# Patient Record
Sex: Female | Born: 1985 | Race: Black or African American | Hispanic: No | Marital: Single | Smoking: Current every day smoker
Health system: Southern US, Community
[De-identification: ages and names within clinical notes are randomized; demographics above are authoritative.]

## PROBLEM LIST (undated history)

## (undated) DIAGNOSIS — N83209 Unspecified ovarian cyst, unspecified side: Secondary | ICD-10-CM

## (undated) DIAGNOSIS — I10 Essential (primary) hypertension: Secondary | ICD-10-CM

## (undated) DIAGNOSIS — N390 Urinary tract infection, site not specified: Secondary | ICD-10-CM

## (undated) DIAGNOSIS — R0600 Dyspnea, unspecified: Secondary | ICD-10-CM

## (undated) HISTORY — PX: APPENDECTOMY: SHX54

---
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
## 2005-05-08 ENCOUNTER — Inpatient Hospital Stay (HOSPITAL_COMMUNITY): Admission: AD | Admit: 2005-05-08 | Discharge: 2005-05-08 | Payer: Self-pay | Admitting: Obstetrics and Gynecology

## 2005-07-22 ENCOUNTER — Ambulatory Visit (HOSPITAL_COMMUNITY): Admission: RE | Admit: 2005-07-22 | Discharge: 2005-07-22 | Payer: Self-pay | Admitting: Obstetrics

## 2005-10-13 ENCOUNTER — Inpatient Hospital Stay (HOSPITAL_COMMUNITY): Admission: AD | Admit: 2005-10-13 | Discharge: 2005-10-14 | Payer: Self-pay | Admitting: Obstetrics

## 2005-11-17 ENCOUNTER — Inpatient Hospital Stay (HOSPITAL_COMMUNITY): Admission: AD | Admit: 2005-11-17 | Discharge: 2005-11-20 | Payer: Self-pay | Admitting: Obstetrics

## 2006-01-07 ENCOUNTER — Emergency Department (HOSPITAL_COMMUNITY): Admission: EM | Admit: 2006-01-07 | Discharge: 2006-01-08 | Payer: Self-pay | Admitting: Emergency Medicine

## 2006-05-25 ENCOUNTER — Inpatient Hospital Stay (HOSPITAL_COMMUNITY): Admission: AD | Admit: 2006-05-25 | Discharge: 2006-05-25 | Payer: Self-pay | Admitting: Obstetrics

## 2006-06-11 ENCOUNTER — Emergency Department (HOSPITAL_COMMUNITY): Admission: EM | Admit: 2006-06-11 | Discharge: 2006-06-11 | Payer: Self-pay | Admitting: Emergency Medicine

## 2006-10-06 ENCOUNTER — Emergency Department (HOSPITAL_COMMUNITY): Admission: EM | Admit: 2006-10-06 | Discharge: 2006-10-07 | Payer: Self-pay | Admitting: Emergency Medicine

## 2007-01-05 ENCOUNTER — Emergency Department (HOSPITAL_COMMUNITY): Admission: EM | Admit: 2007-01-05 | Discharge: 2007-01-05 | Payer: Self-pay | Admitting: Emergency Medicine

## 2007-04-10 ENCOUNTER — Emergency Department (HOSPITAL_COMMUNITY): Admission: EM | Admit: 2007-04-10 | Discharge: 2007-04-10 | Payer: Self-pay | Admitting: Emergency Medicine

## 2007-05-28 HISTORY — PX: OVARIAN CYST SURGERY: SHX726

## 2007-09-29 ENCOUNTER — Inpatient Hospital Stay (HOSPITAL_COMMUNITY): Admission: AD | Admit: 2007-09-29 | Discharge: 2007-09-29 | Payer: Self-pay | Admitting: Family Medicine

## 2007-10-01 ENCOUNTER — Inpatient Hospital Stay (HOSPITAL_COMMUNITY): Admission: AD | Admit: 2007-10-01 | Discharge: 2007-10-01 | Payer: Self-pay | Admitting: Family Medicine

## 2007-10-12 ENCOUNTER — Inpatient Hospital Stay (HOSPITAL_COMMUNITY): Admission: RE | Admit: 2007-10-12 | Discharge: 2007-10-12 | Payer: Self-pay | Admitting: Obstetrics & Gynecology

## 2007-12-20 ENCOUNTER — Inpatient Hospital Stay (HOSPITAL_COMMUNITY): Admission: AD | Admit: 2007-12-20 | Discharge: 2007-12-21 | Payer: Self-pay | Admitting: Obstetrics

## 2007-12-25 ENCOUNTER — Encounter (INDEPENDENT_AMBULATORY_CARE_PROVIDER_SITE_OTHER): Payer: Self-pay | Admitting: Obstetrics

## 2007-12-25 ENCOUNTER — Inpatient Hospital Stay (HOSPITAL_COMMUNITY): Admission: RE | Admit: 2007-12-25 | Discharge: 2007-12-28 | Payer: Self-pay | Admitting: Obstetrics

## 2008-01-22 ENCOUNTER — Ambulatory Visit (HOSPITAL_COMMUNITY): Admission: RE | Admit: 2008-01-22 | Discharge: 2008-01-22 | Payer: Self-pay | Admitting: Obstetrics

## 2008-05-20 ENCOUNTER — Inpatient Hospital Stay (HOSPITAL_COMMUNITY): Admission: AD | Admit: 2008-05-20 | Discharge: 2008-05-20 | Payer: Self-pay | Admitting: Obstetrics

## 2008-05-21 ENCOUNTER — Inpatient Hospital Stay (HOSPITAL_COMMUNITY): Admission: AD | Admit: 2008-05-21 | Discharge: 2008-05-24 | Payer: Self-pay | Admitting: Obstetrics

## 2008-10-28 ENCOUNTER — Emergency Department (HOSPITAL_COMMUNITY): Admission: EM | Admit: 2008-10-28 | Discharge: 2008-10-29 | Payer: Self-pay | Admitting: Emergency Medicine

## 2009-07-15 ENCOUNTER — Emergency Department (HOSPITAL_COMMUNITY): Admission: EM | Admit: 2009-07-15 | Discharge: 2009-07-15 | Payer: Self-pay | Admitting: Emergency Medicine

## 2010-08-15 LAB — URINE CULTURE: Colony Count: >=100000

## 2010-08-15 LAB — URINALYSIS, ROUTINE W REFLEX MICROSCOPIC
Ketones, ur: NEGATIVE mg/dL
Nitrite: POSITIVE — AB
Protein, ur: 100 mg/dL — AB
Urobilinogen, UA: 1 mg/dL (ref 0.0–1.0)
pH: 6.5 (ref 5.0–8.0)

## 2010-08-15 LAB — POCT PREGNANCY, URINE: Preg Test, Ur: NEGATIVE

## 2010-08-15 LAB — URINE MICROSCOPIC-ADD ON

## 2010-09-03 LAB — DIFFERENTIAL
Basophils Absolute: 0 10*3/uL (ref 0.0–0.1)
Basophils Relative: 1 % (ref 0–1)
Eosinophils Absolute: 0.1 10*3/uL (ref 0.0–0.7)
Monocytes Absolute: 0.4 10*3/uL (ref 0.1–1.0)
Neutro Abs: 1.9 10*3/uL (ref 1.7–7.7)
Neutrophils Relative %: 43 % (ref 43–77)

## 2010-09-03 LAB — COMPREHENSIVE METABOLIC PANEL
ALT: 12 U/L (ref 0–35)
Albumin: 4 g/dL (ref 3.5–5.2)
Alkaline Phosphatase: 56 U/L (ref 39–117)
BUN: 12 mg/dL (ref 6–23)
Chloride: 108 mEq/L (ref 96–112)
Glucose, Bld: 88 mg/dL (ref 70–99)
Potassium: 3.5 mEq/L (ref 3.5–5.1)
Sodium: 139 mEq/L (ref 135–145)
Total Bilirubin: 0.4 mg/dL (ref 0.3–1.2)

## 2010-09-03 LAB — CBC
HCT: 39.5 % (ref 36.0–46.0)
Hemoglobin: 13.5 g/dL (ref 12.0–15.0)
RBC: 4.39 MIL/uL (ref 3.87–5.11)
WBC: 4.5 10*3/uL (ref 4.0–10.5)

## 2010-09-03 LAB — URINALYSIS, ROUTINE W REFLEX MICROSCOPIC
Bilirubin Urine: NEGATIVE
Ketones, ur: NEGATIVE mg/dL
Nitrite: NEGATIVE
Specific Gravity, Urine: 1.033 — ABNORMAL HIGH (ref 1.005–1.030)
pH: 6.5 (ref 5.0–8.0)

## 2010-09-03 LAB — GC/CHLAMYDIA PROBE AMP, GENITAL
Chlamydia, DNA Probe: NEGATIVE
GC Probe Amp, Genital: NEGATIVE

## 2010-09-03 LAB — WET PREP, GENITAL

## 2010-09-03 LAB — URINE MICROSCOPIC-ADD ON

## 2010-09-03 LAB — POCT PREGNANCY, URINE: Preg Test, Ur: NEGATIVE

## 2010-09-03 LAB — URINE CULTURE

## 2010-09-03 LAB — PROTIME-INR: INR: 1 (ref 0.00–1.49)

## 2010-10-09 NOTE — Discharge Summary (Signed)
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   The patient was a 25 year old primigravida with bilateral dermoid cyst  and she was brought in and bilateral ovarian cystectomy was performed  with no problems.  She did well and was discharged home on the third  postoperative day, ambulatory, to see me in 2 weeks.   DISCHARGE DIAGNOSES:  Status post a intrauterine pregnancy and bilateral  ovarian cysts.           ______________________________  Kathreen Cosier, M.D.     BAM/MEDQ  D:  01/13/2008  T:  01/13/2008  Job:  [id number]

## 2010-10-09 NOTE — Op Note (Signed)
NAMECOLLIER, MONICA                ACCOUNT NO.:  [id number]   MEDICAL RECORD NO.:  [id number]          PATIENT TYPE:  INP   LOCATION:  9317                          FACILITY:  [facility]   PHYSICIAN:  Kathreen Cosier, M.D.DATE OF BIRTH:  Apr 26, 1986   DATE OF PROCEDURE:  DATE OF DISCHARGE:  12/28/2007                               OPERATIVE REPORT   PREOP DIAGNOSIS:  [redacted] weeks pregnant with bilateral dermoids 5.4 x 3.7 x  4.1 on the left, 9 x 2 x 9.2 on the right.   SURGEON:  Kathreen Cosier, MD   FIRST ASSISTANT:  Roseanna Rainbow, M.D.   ANESTHESIA:  Spinal.   PROCEDURE:  The patient was placed on the operating table in the supine  position after the spinal was administered.  Abdomen prepped and draped,  bladder emptied with Foley catheter.  Paraumbilical midline incision was  made and carried down to fascia.  Fascia cleaned and incised to length  of the incision.  Recti muscles retracted laterally.  Peritoneum incised  longitudinally.  The right ovary was noted to be dermoid 9.2 x 9.2 and  using sharp dissection, the dermoid was dissected free without rupture  and then hemostasis achieved with 2-0 Vicryl sutures interrupted.  On  the left, the procedure done on the similar fashion and hemostasis  achieved with 2-0 Vicryl.  Lap and sponge counts correct.  Abdomen  closed in layers.  Peritoneum continuous suture of 0-chromic fascia,  continuous suture of 0-Dexon.  Skin closed with staples.           ______________________________  Kathreen Cosier, M.D.     BAM/MEDQ  D:  01/13/2008  T:  01/13/2008  Job:  [id number]

## 2010-10-09 NOTE — H&P (Signed)
NAME:  Ann Bruce, Ann Bruce                ACCOUNT NO.:  [id number]   MEDICAL RECORD NO.:  [id number]          PATIENT TYPE:  INP   LOCATION:  9170                          FACILITY:  [facility]   PHYSICIAN:  Roseanna Rainbow, M.D.DATE OF BIRTH:  [birth year]   DATE OF ADMISSION:  05/21/2008  DATE OF DISCHARGE:                              HISTORY & PHYSICAL   CHIEF COMPLAINT:  The patient is a 25 year old with an intrauterine  pregnancy at 37.5 weeks complaining of contractions.   HISTORY OF PRESENT ILLNESS:  Please see the above.   SOCIAL HISTORY:  She is single.  She denies any tobacco, ethanol, or  drug use.   ALLERGIES:  No known drug allergies.   PAST GYNECOLOGIC HISTORY:  Normal triad.  There is a history of  gonorrhea.   PAST OBSTETRICAL HISTORY:  In 2007, she was delivered of a live born  female, 5 pounds 7 ounces at 37 weeks, vaginal delivery, no complications.   PAST SURGICAL HISTORY:  Appendectomy, bilateral ovarian cystectomy,  exploratory laparotomy, missed pregnancy.   FAMILY HISTORY:  Noncontributory.   OBSTETRIC RISK FACTORS:  Please see the above.   PRENATAL LABORATORY DATA:  Hemoglobin 12.2, hematocrit 36.5, platelets  229,000.  Blood type is O positive.  Antibody screen negative.  Sickle  cell trait negative.  RPR nonreactive.  Rubella immune.  Hepatitis B  surface antigen negative.  HIV nonreactive.  Abscess negative.  Gonorrhea and chlamydia probes negative.  Quad screen negative.  Three-  hour GTT normal.  GBS negative on December 8.  Ultrasound at 6 weeks,  bilateral ovarian masses one 9 cm, the other 7 cm consistent with  bilateral dermoid.   PHYSICAL EXAMINATION:  VITAL SIGNS:  Blood pressures 140s/80s.  GENERAL:  Uncomfortable.  ABDOMEN:  Gravid.  STERILE VAGINAL EXAM:  Per the R.N.  Cervix is 6-7 cm dilated.  Fetal  heart tracing reassuring.  Tocodynamometer, uterine contractions every 2-  4 minutes.   ASSESSMENT:  Intrauterine pregnancy at 37 plus  weeks, active labor.  Fetal heart tracing consistent with fetal well-being.  Borderline blood  pressure elevations in the context of pain.   PLAN:  Admission, monitor blood pressures, epidural, anticipate vaginal  delivery.      Roseanna Rainbow, M.D.  Electronically Signed     LAJ/MEDQ  D:  05/21/2008  T:  05/22/2008  Job:  [id number]

## 2010-12-14 ENCOUNTER — Inpatient Hospital Stay (INDEPENDENT_AMBULATORY_CARE_PROVIDER_SITE_OTHER)
Admission: RE | Admit: 2010-12-14 | Discharge: 2010-12-14 | Disposition: A | Discharge status: Home / Self Care | Payer: Self-pay | Source: Ambulatory Visit | Attending: Family Medicine | Admitting: Family Medicine

## 2010-12-14 DIAGNOSIS — L509 Urticaria, unspecified: Secondary | ICD-10-CM

## 2011-02-03 ENCOUNTER — Emergency Department (HOSPITAL_COMMUNITY)
Admission: EM | Admit: 2011-02-03 | Discharge: 2011-02-03 | Discharge status: Against Medical Advice | Payer: No Typology Code available for payment source | Attending: Emergency Medicine | Admitting: Emergency Medicine

## 2011-02-03 ENCOUNTER — Emergency Department (HOSPITAL_COMMUNITY): Payer: No Typology Code available for payment source

## 2011-02-03 ENCOUNTER — Emergency Department (HOSPITAL_COMMUNITY)
Admission: EM | Admit: 2011-02-03 | Discharge: 2011-02-03 | Disposition: A | Discharge status: Home / Self Care | Payer: No Typology Code available for payment source | Attending: Emergency Medicine | Admitting: Emergency Medicine

## 2011-02-03 DIAGNOSIS — M549 Dorsalgia, unspecified: Secondary | ICD-10-CM | POA: Insufficient documentation

## 2011-02-03 DIAGNOSIS — R51 Headache: Secondary | ICD-10-CM | POA: Insufficient documentation

## 2011-02-03 DIAGNOSIS — Y92410 Unspecified street and highway as the place of occurrence of the external cause: Secondary | ICD-10-CM | POA: Insufficient documentation

## 2011-02-03 DIAGNOSIS — T148XXA Other injury of unspecified body region, initial encounter: Secondary | ICD-10-CM | POA: Insufficient documentation

## 2011-02-03 DIAGNOSIS — M542 Cervicalgia: Secondary | ICD-10-CM | POA: Insufficient documentation

## 2011-02-03 DIAGNOSIS — T1490XA Injury, unspecified, initial encounter: Secondary | ICD-10-CM | POA: Insufficient documentation

## 2011-02-07 ENCOUNTER — Emergency Department (HOSPITAL_COMMUNITY)
Admission: EM | Admit: 2011-02-07 | Discharge: 2011-02-07 | Disposition: A | Discharge status: Home / Self Care | Payer: No Typology Code available for payment source | Attending: Emergency Medicine | Admitting: Emergency Medicine

## 2011-02-07 ENCOUNTER — Emergency Department (HOSPITAL_COMMUNITY): Payer: No Typology Code available for payment source

## 2011-02-07 DIAGNOSIS — M545 Low back pain, unspecified: Secondary | ICD-10-CM | POA: Insufficient documentation

## 2011-02-07 DIAGNOSIS — Y92410 Unspecified street and highway as the place of occurrence of the external cause: Secondary | ICD-10-CM | POA: Insufficient documentation

## 2011-02-07 DIAGNOSIS — R51 Headache: Secondary | ICD-10-CM | POA: Insufficient documentation

## 2011-02-07 LAB — POCT PREGNANCY, URINE: Preg Test, Ur: NEGATIVE

## 2011-02-22 LAB — CBC
HCT: 35.9 — ABNORMAL LOW
Hemoglobin: 11.8 — ABNORMAL LOW
Hemoglobin: 12.2
MCHC: 33.5
MCHC: 33.9
RBC: 3.81 — ABNORMAL LOW
RBC: 3.94
RDW: 13.3
WBC: 5.8

## 2011-02-22 LAB — URINE CULTURE: Special Requests: NEGATIVE

## 2011-02-22 LAB — URINALYSIS, ROUTINE W REFLEX MICROSCOPIC
Bilirubin Urine: NEGATIVE
Bilirubin Urine: NEGATIVE
Glucose, UA: NEGATIVE
Hgb urine dipstick: NEGATIVE
Ketones, ur: NEGATIVE
Nitrite: NEGATIVE
Protein, ur: NEGATIVE
Protein, ur: NEGATIVE
Specific Gravity, Urine: 1.015
Urobilinogen, UA: 1
pH: 5.5

## 2011-02-22 LAB — DIFFERENTIAL
Basophils Relative: 0
Lymphocytes Relative: 31
Lymphs Abs: 1.8
Monocytes Absolute: 0.6
Monocytes Relative: 10
Neutro Abs: 3.3
Neutrophils Relative %: 58

## 2011-02-22 LAB — GC/CHLAMYDIA PROBE AMP, GENITAL: Chlamydia, DNA Probe: NEGATIVE

## 2011-02-22 LAB — WET PREP, GENITAL: Clue Cells Wet Prep HPF POC: NONE SEEN

## 2011-03-01 LAB — CBC
Hemoglobin: 10.2 g/dL — ABNORMAL LOW (ref 12.0–15.0)
Platelets: 169 10*3/uL (ref 150–400)
RBC: 3.12 MIL/uL — ABNORMAL LOW (ref 3.87–5.11)
RDW: 13.4 % (ref 11.5–15.5)
WBC: 10.2 10*3/uL (ref 4.0–10.5)
WBC: 9.7 10*3/uL (ref 4.0–10.5)

## 2011-03-11 LAB — I-STAT 8, (EC8 V) (CONVERTED LAB)
BUN: 9
Bicarbonate: 26.4 — ABNORMAL HIGH
Chloride: 104
Hemoglobin: 14.3
Operator id: 235561
Potassium: 3.6
Sodium: 140

## 2011-03-11 LAB — POCT URINALYSIS DIP (DEVICE)
Bilirubin Urine: NEGATIVE
Glucose, UA: NEGATIVE
Nitrite: NEGATIVE
Operator id: 235561

## 2011-03-11 LAB — POCT PREGNANCY, URINE
Operator id: 235561
Preg Test, Ur: NEGATIVE

## 2011-03-11 LAB — URINE CULTURE

## 2011-03-11 LAB — HEPATIC FUNCTION PANEL
Albumin: 3.7
Total Bilirubin: 0.4
Total Protein: 7

## 2011-03-11 LAB — HEPATITIS B SURFACE ANTIGEN: Hepatitis B Surface Ag: NEGATIVE

## 2011-04-05 ENCOUNTER — Encounter: Payer: Self-pay | Admitting: Cardiology

## 2011-04-05 ENCOUNTER — Emergency Department (INDEPENDENT_AMBULATORY_CARE_PROVIDER_SITE_OTHER)
Admission: EM | Admit: 2011-04-05 | Discharge: 2011-04-05 | Disposition: A | Discharge status: Home / Self Care | Payer: No Typology Code available for payment source | Source: Home / Self Care

## 2011-04-05 DIAGNOSIS — N72 Inflammatory disease of cervix uteri: Secondary | ICD-10-CM

## 2011-04-05 HISTORY — DX: Urinary tract infection, site not specified: N39.0

## 2011-04-05 HISTORY — DX: Essential (primary) hypertension: I10

## 2011-04-05 HISTORY — DX: Unspecified ovarian cyst, unspecified side: N83.209

## 2011-04-05 LAB — POCT URINALYSIS DIP (DEVICE)
Ketones, ur: NEGATIVE mg/dL
Protein, ur: NEGATIVE mg/dL
Specific Gravity, Urine: 1.02 (ref 1.005–1.030)

## 2011-04-05 LAB — WET PREP, GENITAL: Yeast Wet Prep HPF POC: NONE SEEN

## 2011-04-05 LAB — POCT PREGNANCY, URINE: Preg Test, Ur: NEGATIVE

## 2011-04-05 MED ORDER — AZITHROMYCIN 250 MG PO TABS
500.0000 mg | ORAL_TABLET | Freq: Once | ORAL | Status: AC
  Administered 2011-04-05: 500 mg via ORAL

## 2011-04-05 MED ORDER — CEFTRIAXONE SODIUM 250 MG IJ SOLR
250.0000 mg | Freq: Once | INTRAMUSCULAR | Status: AC
  Administered 2011-04-05: 250 mg via INTRAMUSCULAR

## 2011-04-05 MED ORDER — CEFTRIAXONE SODIUM 250 MG IJ SOLR
INTRAMUSCULAR | Status: AC
  Filled 2011-04-05: qty 250

## 2011-04-05 MED ORDER — METRONIDAZOLE 500 MG PO TABS: 500.0000 mg | ORAL_TABLET | Freq: Two times a day (BID) | ORAL | Status: AC

## 2011-04-05 MED ORDER — FLUCONAZOLE 200 MG PO TABS: 200.0000 mg | ORAL_TABLET | Freq: Once | ORAL | Status: AC

## 2011-04-05 MED ORDER — AZITHROMYCIN 250 MG PO TABS
ORAL_TABLET | ORAL | Status: AC
  Filled 2011-04-05: qty 2

## 2011-04-05 NOTE — ED Notes (Signed)
Pt noticed white vaginal discharge with yeast type smell that started 1 week ago. Little vaginal itching. Pt reports possible exposure to STD.

## 2011-04-06 LAB — GC/CHLAMYDIA PROBE AMP, GENITAL: GC Probe Amp, Genital: NEGATIVE

## 2011-04-08 ENCOUNTER — Telehealth (HOSPITAL_COMMUNITY): Payer: Self-pay | Admitting: *Deleted

## 2011-05-04 NOTE — ED Provider Notes (Signed)
History     CSN: [id number] Arrival date & time: 04/05/2011  8:55 AM   First MD Initiated Contact with Patient 04/05/11 [phone]      Chief Complaint  Patient presents with  . Exposure to STD    (Consider location/radiation/quality/duration/timing/severity/associated sxs/prior treatment) HPI Comments: 25 y/o female here c/o vaginal discharge and itchiness and possible STD exposure as having unprotected sex.    Past Medical History  Diagnosis Date  . Hypertension   . UTI (urinary tract infection)   . Ovarian cyst     Past Surgical History  Procedure Date  . Appendectomy   . Ovarian cyst surgery 2009  . Vaginal delivery 2009    No family history on file.  History  Substance Use Topics  . Smoking status: Current Everyday Smoker -- 0.5 packs/day  . Smokeless tobacco: Not on file  . Alcohol Use: Yes     occassional     OB History    Grav Para Term Preterm Abortions TAB SAB Ect Mult Living                  Review of Systems  Constitutional: Negative.   HENT: Negative.   Genitourinary: Positive for vaginal discharge and vaginal pain. Negative for dysuria, hematuria, flank pain, vaginal bleeding and pelvic pain.    Allergies  Review of patient's allergies indicates no known allergies.  Home Medications   Current Outpatient Rx  Name Route Sig Dispense Refill  . HYDROCHLOROTHIAZIDE 12.5 MG PO CAPS Oral Take 12.5 mg by mouth daily.      Marland Kitchen MEDROXYPROGESTERONE ACETATE 150 MG/ML IM SUSP Intramuscular Inject 150 mg into the muscle every 3 (three) months.        BP 130/93  Pulse 87  Temp(Src) 98.4 F (36.9 C) (Oral)  Resp 18  SpO2 100%  Physical Exam  Nursing note and vitals reviewed. Constitutional: She is oriented to person, place, and time. She appears well-developed and well-nourished. No distress.  Cardiovascular: Normal heart sounds.   Pulmonary/Chest: Breath sounds normal.  Abdominal: Soft. She exhibits no distension. There is no tenderness.    Genitourinary: Vaginal discharge found.       Vaginal erythema.Cervix with erythema and endocervical exudate. Bimanual : No TCM. No adnexal masses  Lymphadenopathy:    She has no cervical adenopathy.  Neurological: She is alert and oriented to person, place, and time.  Skin: No rash noted.    ED Course  Procedures (including critical care time)  Labs Reviewed  POCT URINALYSIS DIP (DEVICE) - Abnormal; Notable for the following:    Hgb urine dipstick SMALL (*)    Urobilinogen, UA 2.0 (*)    All other components within normal limits  WET PREP, GENITAL - Abnormal; Notable for the following:    Trich, Wet Prep TOO NUMEROUS TO COUNT (*)    WBC, Wet Prep HPF POC MANY (*)    All other components within normal limits  POCT PREGNANCY, URINE  GC/CHLAMYDIA PROBE AMP, GENITAL  LAB REPORT - SCANNED   No results found.   1. Cervicitis and endocervicitis   2. Vaginitis and vulvovaginitis       MDM  Gc/chl not back on discharge. Pt was treated with rocephin, Zithromax and flagyl.        Sharin Grave, MD 05/04/11 1726

## 2011-11-07 ENCOUNTER — Encounter (HOSPITAL_COMMUNITY): Payer: Self-pay | Admitting: Emergency Medicine

## 2011-11-07 ENCOUNTER — Emergency Department (HOSPITAL_COMMUNITY)
Admission: EM | Admit: 2011-11-07 | Discharge: 2011-11-07 | Disposition: A | Discharge status: Home / Self Care | Payer: Self-pay | Attending: Emergency Medicine | Admitting: Emergency Medicine

## 2011-11-07 DIAGNOSIS — F17200 Nicotine dependence, unspecified, uncomplicated: Secondary | ICD-10-CM | POA: Insufficient documentation

## 2011-11-07 DIAGNOSIS — I10 Essential (primary) hypertension: Secondary | ICD-10-CM | POA: Insufficient documentation

## 2011-11-07 DIAGNOSIS — G43909 Migraine, unspecified, not intractable, without status migrainosus: Secondary | ICD-10-CM | POA: Insufficient documentation

## 2011-11-07 LAB — POCT PREGNANCY, URINE: Preg Test, Ur: NEGATIVE

## 2011-11-07 MED ORDER — DIPHENHYDRAMINE HCL 50 MG/ML IJ SOLN
12.5000 mg | Freq: Once | INTRAMUSCULAR | Status: AC
  Administered 2011-11-07: 12.5 mg via INTRAVENOUS
  Filled 2011-11-07: qty 1

## 2011-11-07 MED ORDER — METOCLOPRAMIDE HCL 5 MG/ML IJ SOLN
10.0000 mg | Freq: Once | INTRAMUSCULAR | Status: AC
  Administered 2011-11-07: 10 mg via INTRAVENOUS
  Filled 2011-11-07: qty 2

## 2011-11-07 MED ORDER — DEXAMETHASONE SODIUM PHOSPHATE 10 MG/ML IJ SOLN
10.0000 mg | Freq: Once | INTRAMUSCULAR | Status: AC
  Administered 2011-11-07: 10 mg via INTRAVENOUS
  Filled 2011-11-07: qty 1

## 2011-11-07 MED ORDER — SODIUM CHLORIDE 0.9 % IV BOLUS (SEPSIS)
1000.0000 mL | Freq: Once | INTRAVENOUS | Status: AC
  Administered 2011-11-07: 1000 mL via INTRAVENOUS

## 2011-11-07 NOTE — ED Notes (Signed)
C/o headache since around 4pm.  Reports feeling dizzy and drowsy.  Denies nausea.

## 2011-11-07 NOTE — ED Provider Notes (Signed)
History     CSN: [id number]  Arrival date & time 11/07/11  0021   First MD Initiated Contact with Patient 11/07/11 0127      Chief Complaint  Patient presents with  . Headache    (Consider location/radiation/quality/duration/timing/severity/associated sxs/prior treatment) HPI Patient is a 25 roll female who presents today complaining of 10 out of 10 bilateral frontal headache that began around 4 PM. She reports lightheadedness with this but denies nausea, vomiting, numbness, tingling, or paresthesias. She does occasionally have headaches. Normally she is able to treat these with over-the-counter medications at home but she reports that this headache was so bad she was unable to do so. Patient denies any neck pain or rashes. She is slightly hypertensive upon presentation but has a history of this and has not been taking medication as she is waiting for her insurance comes through. There are no other associated or modifying factors. Past Medical History  Diagnosis Date  . Hypertension   . UTI (urinary tract infection)   . Ovarian cyst     Past Surgical History  Procedure Date  . Appendectomy   . Ovarian cyst surgery 2009  . Vaginal delivery 2009    History reviewed. No pertinent family history.  History  Substance Use Topics  . Smoking status: Current Everyday Smoker -- 0.5 packs/day  . Smokeless tobacco: Not on file  . Alcohol Use: Yes     occassional     OB History    Grav Para Term Preterm Abortions TAB SAB Ect Mult Living                  Review of Systems  Constitutional: Negative.   Eyes: Positive for photophobia.  Respiratory: Negative.   Cardiovascular: Negative.   Gastrointestinal: Negative.   Genitourinary: Negative.   Musculoskeletal: Negative.   Skin: Negative.   Neurological: Positive for headaches.  Hematological: Negative.   Psychiatric/Behavioral: Negative.   All other systems reviewed and are negative.    Allergies  Review of patient's  allergies indicates no known allergies.  Home Medications  No current outpatient prescriptions on file.  BP 130/85  Pulse 78  Temp 98.3 F (36.8 C) (Oral)  Resp 18  SpO2 99%  Physical Exam  Nursing note and vitals reviewed. GEN: Well-developed, well-nourished female in no distress, uncomfortable appearing HEENT: Atraumatic, normocephalic. Oropharynx clear without erythema EYES: PERRLA BL, no scleral icterus. NECK: Trachea midline, no meningismus CV: regular rate and rhythm. No murmurs, rubs, or gallops PULM: No respiratory distress.  No crackles, wheezes, or rales. GI: soft, non-tender. No guarding, rebound, or tenderness. + bowel sounds  GU: deferred Neuro: cranial nerves grossly 2-12 intact, no abnormalities of strength or sensation, A and O x 3 MSK: Patient moves all 4 extremities symmetrically, no deformity, edema, or injury noted Skin: No rashes petechiae, purpura, or jaundice Psych: no abnormality of mood   ED Course  Procedures (including critical care time)   Labs Reviewed  POCT PREGNANCY, URINE   No results found.   1. Migraine       MDM  Patient was evaluated by myself. She had slight hypertension but otherwise is hemodynamically stable. Patient was having lightheadedness rather than vertiginous symptoms. She denies any other neurologic symptoms. Patient was treated with headache cocktail including Reglan and Benadryl. IV fluids were also administered. Patient had significant improvement in her pain with this. A pregnancy test was performed as patient reported irregular periods but did not believe she be pregnant. This was negative.  Following resolution of her symptoms a dose of Decadron was administered to prevent recurrence. Patient was discharged in good condition with diagnosis of migraine. She can followup with her regular Dr. as needed. We discussed prescription of any hypertensive medications this evening. Patient actually has prescription and just cannot  afford the medications. Therefore no additional prescription was provided for this this evening.        Cyndra Numbers, MD 11/08/11 [phone]

## 2012-04-10 ENCOUNTER — Emergency Department (HOSPITAL_COMMUNITY)
Admission: EM | Admit: 2012-04-10 | Discharge: 2012-04-10 | Disposition: A | Discharge status: Home / Self Care | Payer: BC Managed Care – PPO | Source: Home / Self Care | Attending: Emergency Medicine | Admitting: Emergency Medicine

## 2012-04-10 ENCOUNTER — Encounter (HOSPITAL_COMMUNITY): Payer: Self-pay | Admitting: Emergency Medicine

## 2012-04-10 DIAGNOSIS — S335XXA Sprain of ligaments of lumbar spine, initial encounter: Secondary | ICD-10-CM

## 2012-04-10 DIAGNOSIS — N898 Other specified noninflammatory disorders of vagina: Secondary | ICD-10-CM

## 2012-04-10 DIAGNOSIS — S39012A Strain of muscle, fascia and tendon of lower back, initial encounter: Secondary | ICD-10-CM

## 2012-04-10 LAB — POCT URINALYSIS DIP (DEVICE)
Ketones, ur: NEGATIVE mg/dL
Leukocytes, UA: NEGATIVE
Nitrite: NEGATIVE
Protein, ur: NEGATIVE mg/dL
Urobilinogen, UA: 4 mg/dL — ABNORMAL HIGH (ref 0.0–1.0)

## 2012-04-10 LAB — WET PREP, GENITAL: Yeast Wet Prep HPF POC: NONE SEEN

## 2012-04-10 MED ORDER — NAPROXEN 500 MG PO TABS: 500.0000 mg | ORAL_TABLET | Freq: Two times a day (BID) | ORAL | Status: DC

## 2012-04-10 MED ORDER — CYCLOBENZAPRINE HCL 10 MG PO TABS: 10.0000 mg | ORAL_TABLET | Freq: Two times a day (BID) | ORAL | Status: DC | PRN

## 2012-04-10 NOTE — ED Provider Notes (Signed)
Medical screening examination/treatment/procedure(s) were performed by non-physician practitioner and as supervising physician I was immediately available for consultation/collaboration.  Leslee Home, M.D.   Reuben Likes, MD 04/10/12 2119

## 2012-04-10 NOTE — ED Notes (Signed)
Pt c/o white vaginal discharge x4 days... Sx include: foul odor, lower back pain, headache.... Denies: urinary prob, abd/pelvic pain, fevers, vomiting, nauseas, diarrhea... Pt is alert w/no signs of distress.

## 2012-04-10 NOTE — ED Provider Notes (Addendum)
History     CSN: [id number]  Arrival date & time 04/10/12  1234   First MD Initiated Contact with Patient 04/10/12 1535      Chief Complaint  Patient presents with  . Vaginal Discharge    (Consider location/radiation/quality/duration/timing/severity/associated sxs/prior treatment) Patient is a 26 y.o. female presenting with vaginal discharge and back pain. The history is provided by the patient.  Vaginal Discharge This is a new problem. The current episode started more than 2 days ago (4 days). The problem occurs daily. The problem has not changed since onset.Pertinent negatives include no abdominal pain and no headaches. Nothing aggravates the symptoms. Nothing relieves the symptoms. She has tried nothing for the symptoms.  Back Pain  This is a new problem. The current episode started more than 1 week ago. The problem occurs daily. The problem has been gradually worsening. The pain is associated with no known injury. The pain is present in the lumbar spine. The quality of the pain is described as shooting. The pain does not radiate. The pain is at a severity of 6/10. The symptoms are aggravated by bending, twisting and certain positions. The pain is the same all the time. Stiffness is present in the morning. Pertinent negatives include no numbness, no weight loss, no headaches, no abdominal pain, no abdominal swelling, no bowel incontinence, no perianal numbness, no bladder incontinence, no dysuria, no paresthesias, no paresis, no tingling and no weakness. She has tried nothing for the symptoms. The treatment provided no relief. 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  26 y.o. female complains of nonirritating vaginal discharge for 4 days.  Denies abnormal vaginal bleeding, significant pelvic pain or fever. No UTI symptoms. Sexually active, does not use condoms, no change in partner.  Last unprotected intercourse 4 days ago.  Denies history of known exposure to STD  or symptoms in partner.  No LMP recorded.  Previously on depo, no menses in 3 years.  History of trich one year ago.  Last pap-NIL.      Past Medical History  Diagnosis Date  . Hypertension   . UTI (urinary tract infection)   . Ovarian cyst     Past Surgical History  Procedure Date  . Appendectomy   . Ovarian cyst surgery 2009  . Vaginal delivery 2009    No family history on file.  History  Substance Use Topics  . Smoking status: Current Every Day Smoker -- 0.5 packs/day  . Smokeless tobacco: Not on file  . Alcohol Use: Yes     Comment: occassional     OB History    Grav Para Term Preterm Abortions TAB SAB Ect Mult Living                  Review of Systems  Constitutional: Negative for weight loss.  Gastrointestinal: Negative for abdominal pain and bowel incontinence.  Genitourinary: Positive for vaginal discharge. Negative for bladder incontinence and dysuria.  Musculoskeletal: Positive for back pain.  Neurological: Negative for tingling, weakness, numbness, headaches and paresthesias.  All other systems reviewed and are negative.    Allergies  Review of patient's allergies indicates no known allergies.  Home Medications   Current Outpatient Rx  Name  Route  Sig  Dispense  Refill  . CYCLOBENZAPRINE HCL 10 MG PO TABS   Oral   Take 1 tablet (10 mg total) by mouth 2 (two) times daily as needed for muscle spasms.   20 tablet   0   .  METRONIDAZOLE 500 MG PO TABS   Oral   Take 1 tablet (500 mg total) by mouth 2 (two) times daily.   14 tablet   0   . NAPROXEN 500 MG PO TABS   Oral   Take 1 tablet (500 mg total) by mouth 2 (two) times daily.   30 tablet   0     BP 142/98  Pulse 77  Temp 98.6 F (37 C) (Oral)  Resp 20  SpO2 100%  Physical Exam  Nursing note and vitals reviewed. Constitutional: She is oriented to person, place, and time. Vital signs are normal. She appears well-developed and well-nourished. She is active and cooperative.  HENT:    Head: Normocephalic.  Mouth/Throat: Oropharynx is clear and moist. No oropharyngeal exudate.  Eyes: Conjunctivae normal are normal. Pupils are equal, round, and reactive to light. No scleral icterus.  Neck: Trachea normal and normal range of motion. Neck supple.  Cardiovascular: Normal rate, regular rhythm, normal heart sounds and intact distal pulses.   Pulmonary/Chest: Effort normal and breath sounds normal.  Abdominal: Soft. Bowel sounds are normal. There is no tenderness.  Genitourinary: Uterus normal. Pelvic exam was performed with patient supine. No labial fusion. There is no rash, tenderness, lesion or injury on the right labia. There is no rash, tenderness, lesion or injury on the left labia. Cervix exhibits discharge. Right adnexum displays no mass, no tenderness and no fullness. Left adnexum displays no mass, no tenderness and no fullness. No erythema, tenderness or bleeding around the vagina. No foreign body around the vagina. No signs of injury around the vagina. Vaginal discharge found.  Lymphadenopathy:    She has no cervical adenopathy.       Right: No inguinal adenopathy present.       Left: No inguinal adenopathy present.  Neurological: She is alert and oriented to person, place, and time. She has normal strength and normal reflexes. No cranial nerve deficit or sensory deficit. Coordination and gait normal. GCS eye subscore is 4. GCS verbal subscore is 5. GCS motor subscore is 6.  Skin: Skin is warm and dry.  Psychiatric: She has a normal mood and affect. Her speech is normal and behavior is normal. Judgment and thought content normal. Cognition and memory are normal.    ED Course  Procedures (including critical care time)  Labs Reviewed  POCT URINALYSIS DIP (DEVICE) - Abnormal; Notable for the following:    Urobilinogen, UA 4.0 (*)     All other components within normal limits  WET PREP, GENITAL - Abnormal; Notable for the following:    Clue Cells Wet Prep HPF POC MANY  (*)     WBC, Wet Prep HPF POC TOO NUMEROUS TO COUNT (*)     All other components within normal limits  RPR  HIV ANTIBODY (ROUTINE TESTING)  GC/CHLAMYDIA PROBE AMP   No results found.   1. Vaginal discharge   2. Lumbar strain       MDM  Take medications as prescribed, await lab results.  Follow up primary care provider.          Johnsie Kindred, NP 04/10/12 1611  Johnsie Kindred, NP 04/11/12 1617

## 2012-04-11 LAB — HIV ANTIBODY (ROUTINE TESTING W REFLEX): HIV: NONREACTIVE

## 2012-04-11 MED ORDER — METRONIDAZOLE 500 MG PO TABS: 500.0000 mg | ORAL_TABLET | Freq: Two times a day (BID) | ORAL | Status: DC

## 2012-04-11 NOTE — ED Provider Notes (Signed)
Medical screening examination/treatment/procedure(s) were performed by non-physician practitioner and as supervising physician I was immediately available for consultation/collaboration.  Leslee Home, M.D.   Reuben Likes, MD 04/11/12 [phone]

## 2012-04-13 ENCOUNTER — Telehealth (HOSPITAL_COMMUNITY): Payer: Self-pay | Admitting: Emergency Medicine

## 2012-04-13 LAB — POCT PREGNANCY, URINE: Preg Test, Ur: NEGATIVE

## 2012-04-13 NOTE — ED Notes (Signed)
Patient called for lab results.  I verified patient name DOB and address.  Chart reviewed by Lannie Fields NP.  Patient made aware her wet prep showed bacterial vaginosis and she was given an RX for that at the time of her visit.  She was made aware other labs were normal.

## 2012-04-14 NOTE — ED Notes (Signed)
GC/Chlamydia neg., Wet prep: many clue cells, WBC's TNTC, HIV/RPR non-reactive.   Pt. adequately treated with Flagyl. Vassie Moselle 04/14/2012

## 2012-06-22 ENCOUNTER — Emergency Department (HOSPITAL_COMMUNITY)
Admission: EM | Admit: 2012-06-22 | Discharge: 2012-06-22 | Disposition: A | Discharge status: Home / Self Care | Payer: BC Managed Care – PPO | Source: Home / Self Care

## 2012-06-22 ENCOUNTER — Encounter (HOSPITAL_COMMUNITY): Payer: Self-pay | Admitting: *Deleted

## 2012-06-22 ENCOUNTER — Other Ambulatory Visit (HOSPITAL_COMMUNITY)
Admission: RE | Admit: 2012-06-22 | Discharge: 2012-06-22 | Disposition: A | Discharge status: Home / Self Care | Payer: BC Managed Care – PPO | Source: Ambulatory Visit | Attending: Emergency Medicine | Admitting: Emergency Medicine

## 2012-06-22 DIAGNOSIS — N760 Acute vaginitis: Secondary | ICD-10-CM | POA: Insufficient documentation

## 2012-06-22 DIAGNOSIS — A499 Bacterial infection, unspecified: Secondary | ICD-10-CM

## 2012-06-22 DIAGNOSIS — R102 Pelvic and perineal pain: Secondary | ICD-10-CM

## 2012-06-22 DIAGNOSIS — Z113 Encounter for screening for infections with a predominantly sexual mode of transmission: Secondary | ICD-10-CM | POA: Insufficient documentation

## 2012-06-22 DIAGNOSIS — N949 Unspecified condition associated with female genital organs and menstrual cycle: Secondary | ICD-10-CM

## 2012-06-22 DIAGNOSIS — S335XXA Sprain of ligaments of lumbar spine, initial encounter: Secondary | ICD-10-CM

## 2012-06-22 DIAGNOSIS — N898 Other specified noninflammatory disorders of vagina: Secondary | ICD-10-CM

## 2012-06-22 LAB — POCT PREGNANCY, URINE: Preg Test, Ur: NEGATIVE

## 2012-06-22 LAB — POCT URINALYSIS DIP (DEVICE)
Bilirubin Urine: NEGATIVE
Glucose, UA: NEGATIVE mg/dL
Ketones, ur: NEGATIVE mg/dL
Leukocytes, UA: NEGATIVE

## 2012-06-22 MED ORDER — METRONIDAZOLE 500 MG PO TABS: 500.0000 mg | ORAL_TABLET | Freq: Two times a day (BID) | ORAL | Status: DC

## 2012-06-22 NOTE — ED Provider Notes (Signed)
Medical screening examination/treatment/procedure(s) were performed by non-physician practitioner and as supervising physician I was immediately available for consultation/collaboration.  Leslee Home, M.D.   Reuben Likes, MD 06/22/12 1434

## 2012-06-22 NOTE — ED Notes (Signed)
Pt reports foul smelling discharge and slight itching - similar symptoms when she had bacterial vaginosis previously

## 2012-06-22 NOTE — ED Provider Notes (Signed)
History     CSN: [id number]  Arrival date & time 06/22/12  1028   None     Chief Complaint  Patient presents with  . Vaginitis    (Consider location/radiation/quality/duration/timing/severity/associated sxs/prior treatment) HPI Comments: 27 year old female complaining of bilateral pelvic pain associated with a malodorous discharge. She states that a couple months ago she had a nonpainful blood wrist discharge and was diagnosed with BV. No nausea, vomiting or abdominal pain. No fever, chills or other constitutional symptoms. Denies urinary symptoms.   Past Medical History  Diagnosis Date  . Hypertension   . UTI (urinary tract infection)   . Ovarian cyst     Past Surgical History  Procedure Date  . Appendectomy   . Ovarian cyst surgery 2009  . Vaginal delivery 2009    Family History  Problem Relation Age of Onset  . Family history unknown: Yes    History  Substance Use Topics  . Smoking status: Current Every Day Smoker -- 0.5 packs/day  . Smokeless tobacco: Not on file  . Alcohol Use: Yes     Comment: occassional     OB History    Grav Para Term Preterm Abortions TAB SAB Ect Mult Living                  Review of Systems  Constitutional: Negative.   Respiratory: Negative.   Gastrointestinal: Negative.   Genitourinary: Positive for vaginal discharge and pelvic pain. Negative for dysuria, frequency and flank pain.  Neurological: Negative.   Psychiatric/Behavioral: Negative.     Allergies  Review of patient's allergies indicates no known allergies.  Home Medications   Current Outpatient Rx  Name  Route  Sig  Dispense  Refill  . CYCLOBENZAPRINE HCL 10 MG PO TABS   Oral   Take 1 tablet (10 mg total) by mouth 2 (two) times daily as needed for muscle spasms.   20 tablet   0   . METRONIDAZOLE 500 MG PO TABS   Oral   Take 1 tablet (500 mg total) by mouth 2 (two) times daily. X 7 days   14 tablet   0   . NAPROXEN 500 MG PO TABS   Oral   Take 1  tablet (500 mg total) by mouth 2 (two) times daily.   30 tablet   0     BP 154/94  Pulse 85  Temp 98.5 F (36.9 C) (Oral)  Resp 18  SpO2 100%  LMP 06/02/2012  Physical Exam  Constitutional: She is oriented to person, place, and time. She appears well-developed and well-nourished. No distress.  Eyes: Conjunctivae normal and EOM are normal.  Neck: Normal range of motion. Neck supple.  Pulmonary/Chest: Effort normal.  Abdominal: Soft. She exhibits no distension and no mass. There is no tenderness. There is no rebound and no guarding.  Genitourinary:       Manual palpation to the external pelvis reveals tenderness in the bilateral pelvis. Normal external female genitalia Cervix is midline and posterior. Cervix is pink, smooth and without erythema or lesions. The os is friable area There is a thick colloidal discharge coating the cervix and exuding from the os. There is an additional discharge described as thin pale green. Bimanual: Mild cervical motion tenderness and mild bilateral adnexal tenderness.   Musculoskeletal: She exhibits no edema.  Neurological: She is alert and oriented to person, place, and time.  Skin: Skin is warm and dry.  Psychiatric: She has a normal mood and affect.  ED Course  Procedures (including critical care time)  Labs Reviewed  POCT URINALYSIS DIP (DEVICE) - Abnormal; Notable for the following:    Hgb urine dipstick SMALL (*)     All other components within normal limits  POCT PREGNANCY, URINE  CERVICOVAGINAL ANCILLARY ONLY   No results found.   1. BV (bacterial vaginosis)   2. Vaginitis   3. Pelvic pain in female       MDM  Metronidazole 500 mg twice a day for 7 days Instructions on vaginitis, BP and pelvic pain. Her pelvic pain is mild as well as mildly tender. The cervix appears healthy. She has a history of BV I will treat her with metronidazole as above. I obtained an Aptiva and Afirm testing, those results are pending. Upon return  within 24 hours if anything is positive we will call the patient and treat accordingly. Pregnancy test is negative and urinalysis is negative. Results for orders placed during the hospital encounter of 06/22/12  POCT URINALYSIS DIP (DEVICE)      Component Value Range   Glucose, UA NEGATIVE  NEGATIVE mg/dL   Bilirubin Urine NEGATIVE  NEGATIVE   Ketones, ur NEGATIVE  NEGATIVE mg/dL   Specific Gravity, Urine >=1.030  1.005 - 1.030   Hgb urine dipstick SMALL (*) NEGATIVE   pH 5.5  5.0 - 8.0   Protein, ur NEGATIVE  NEGATIVE mg/dL   Urobilinogen, UA 1.0  0.0 - 1.0 mg/dL   Nitrite NEGATIVE  NEGATIVE   Leukocytes, UA NEGATIVE  NEGATIVE  POCT PREGNANCY, URINE      Component Value Range   Preg Test, Ur NEGATIVE  NEGATIVE            Hayden Rasmussen, NP 06/22/12 1226

## 2012-06-23 NOTE — ED Notes (Signed)
GC/Chlamydia neg., Affirm: Candida and trich neg., Gardnerella pos.  Pt. adequately treated with Flagyl. Vassie Moselle 06/23/2012

## 2012-07-15 ENCOUNTER — Emergency Department (HOSPITAL_COMMUNITY)
Admission: EM | Admit: 2012-07-15 | Discharge: 2012-07-15 | Disposition: A | Discharge status: Home / Self Care | Payer: BC Managed Care – PPO | Attending: Emergency Medicine | Admitting: Emergency Medicine

## 2012-07-15 ENCOUNTER — Encounter (HOSPITAL_COMMUNITY): Payer: Self-pay | Admitting: *Deleted

## 2012-07-15 DIAGNOSIS — Z8679 Personal history of other diseases of the circulatory system: Secondary | ICD-10-CM | POA: Insufficient documentation

## 2012-07-15 DIAGNOSIS — Z8742 Personal history of other diseases of the female genital tract: Secondary | ICD-10-CM | POA: Insufficient documentation

## 2012-07-15 DIAGNOSIS — Z87440 Personal history of urinary (tract) infections: Secondary | ICD-10-CM | POA: Insufficient documentation

## 2012-07-15 DIAGNOSIS — Z3202 Encounter for pregnancy test, result negative: Secondary | ICD-10-CM | POA: Insufficient documentation

## 2012-07-15 DIAGNOSIS — I10 Essential (primary) hypertension: Secondary | ICD-10-CM | POA: Insufficient documentation

## 2012-07-15 DIAGNOSIS — R51 Headache: Secondary | ICD-10-CM | POA: Insufficient documentation

## 2012-07-15 DIAGNOSIS — F17200 Nicotine dependence, unspecified, uncomplicated: Secondary | ICD-10-CM | POA: Insufficient documentation

## 2012-07-15 MED ORDER — METOCLOPRAMIDE HCL 5 MG/ML IJ SOLN
10.0000 mg | Freq: Once | INTRAMUSCULAR | Status: AC
  Administered 2012-07-15: 10 mg via INTRAVENOUS
  Filled 2012-07-15: qty 2

## 2012-07-15 MED ORDER — DIPHENHYDRAMINE HCL 50 MG/ML IJ SOLN
12.5000 mg | Freq: Once | INTRAMUSCULAR | Status: AC
  Administered 2012-07-15: 12.5 mg via INTRAVENOUS
  Filled 2012-07-15: qty 1

## 2012-07-15 MED ORDER — SODIUM CHLORIDE 0.9 % IV BOLUS (SEPSIS)
1000.0000 mL | Freq: Once | INTRAVENOUS | Status: AC
  Administered 2012-07-15: 1000 mL via INTRAVENOUS

## 2012-07-15 MED ORDER — KETOROLAC TROMETHAMINE 30 MG/ML IJ SOLN
30.0000 mg | Freq: Once | INTRAMUSCULAR | Status: AC
  Administered 2012-07-15: 30 mg via INTRAVENOUS
  Filled 2012-07-15: qty 1

## 2012-07-15 NOTE — ED Provider Notes (Signed)
History     CSN: [id number]  Arrival date & time 07/15/12  9147   First MD Initiated Contact with Patient 07/15/12 [phone]      Chief Complaint  Patient presents with  . Headache    (Consider location/radiation/quality/duration/timing/severity/associated sxs/prior treatment) HPI  27 year-old female with history of hypertension, and history of migraine presents complaining of headache. Patient reports gradual onset of headache for the past 2 days. Describe headaches as a sharp and throbbing sensation to her forehead, nonradiating. She endorses some light sensitivities. Rate headaches as 8/10, not relieved with taking ibuprofen which usually helps her with her headache. Her last measured headaches was 4 months ago. Patient thinks her headaches may be related to her hypertension. States she has not taken her blood pressure medication for several months due to not having insurance. Patient also reports that her last menstrual period was January 1, usually has regular menstruation. She is sexually active. Patient denies fever, chills, double vision, sore throat, sneezing, coughing, neck stiffness, chest pain, short of breath, abdominal pain, nausea, vomiting, diarrhea, or rash. She denies any numbness weakness.  Past Medical History  Diagnosis Date  . Hypertension   . UTI (urinary tract infection)   . Ovarian cyst     Past Surgical History  Procedure Laterality Date  . Appendectomy    . Ovarian cyst surgery  2009  . Vaginal delivery  2009    No family history on file.  History  Substance Use Topics  . Smoking status: Current Every Day Smoker -- 0.50 packs/day  . Smokeless tobacco: Not on file  . Alcohol Use: Yes     Comment: occassional     OB History   Grav Para Term Preterm Abortions TAB SAB Ect Mult Living                  Review of Systems  Constitutional:       10 Systems reviewed and all are negative for acute change except as noted in the HPI.     Allergies   Review of patient's allergies indicates no known allergies.  Home Medications   Current Outpatient Rx  Name  Route  Sig  Dispense  Refill  . ibuprofen (ADVIL,MOTRIN) 200 MG tablet   Oral   Take 200 mg by mouth every 6 (six) hours as needed for pain.           BP 169/105  Pulse 97  Temp(Src) 98.6 F (37 C) (Oral)  Resp 18  SpO2 100%  LMP 06/02/2012  Physical Exam  Nursing note and vitals reviewed. Constitutional: She is oriented to person, place, and time. She appears well-developed and well-nourished. No distress.  Awake, alert, nontoxic appearance  HENT:  Head: Atraumatic.  Right Ear: External ear normal.  Left Ear: External ear normal.  Mouth/Throat: Oropharynx is clear and moist.  Eyes: Conjunctivae and EOM are normal. Pupils are equal, round, and reactive to light. Right eye exhibits no discharge. Left eye exhibits no discharge.  Neck: Neck supple.  No nuchal rigidity  Cardiovascular: Normal rate and regular rhythm.   Pulmonary/Chest: Effort normal. No respiratory distress. She exhibits no tenderness.  Abdominal: Soft. There is no tenderness. There is no rebound.  Musculoskeletal: She exhibits no tenderness.  ROM appears intact, no obvious focal weakness  Neurological: She is alert and oriented to person, place, and time. She has normal strength. No cranial nerve deficit or sensory deficit. GCS eye subscore is 4. GCS verbal subscore is 5. GCS  motor subscore is 6.  Mental status and motor strength appears intact  Skin: No rash noted.  Psychiatric: She has a normal mood and affect.    ED Course  Procedures (including critical care time)  Labs Reviewed - No data to display No results found.   No diagnosis found.  7:59 AM The patient was seen and evaluated for headache. She has no red flags finding. I have low suspicion for meningitis, subarachnoid hemorrhage, or any concerning findings. She denies any specific aura prior to headache. Patient initially came  with a blood pressure of 169/105, however on recheck it was 130 systolic.  Pt also reports not having her menstruation on time, will check a pregnancy test.  Migraine cocktail and IVF given.  Will continue to monitor.    10:34 AM Patient felt much better after receiving a migraine cocktail. She is able to ambulate without difficulty. Patient is stable for discharge.   1. headache  MDM          Fayrene Helper, PA-C 07/15/12 1035

## 2012-07-15 NOTE — ED Provider Notes (Signed)
Medical screening examination/treatment/procedure(s) were performed by non-physician practitioner and as supervising physician I was immediately available for consultation/collaboration.   Celene Kras, MD 07/15/12 [phone]

## 2012-07-15 NOTE — ED Notes (Addendum)
Pt reports headache since last night. Has taken ibuprofen without relief. Hx of migraines. Pain located in forehead. Denies nausea/vomiting. No blurred vision. +Photosensitivity. Supposed to be on BP medications, but has been off for months. BP 169/105.

## 2012-07-26 ENCOUNTER — Emergency Department (HOSPITAL_COMMUNITY)
Admission: EM | Admit: 2012-07-26 | Discharge: 2012-07-26 | Disposition: A | Discharge status: Home / Self Care | Payer: BC Managed Care – PPO | Attending: Emergency Medicine | Admitting: Emergency Medicine

## 2012-07-26 ENCOUNTER — Encounter (HOSPITAL_COMMUNITY): Payer: Self-pay | Admitting: Emergency Medicine

## 2012-07-26 DIAGNOSIS — I10 Essential (primary) hypertension: Secondary | ICD-10-CM | POA: Insufficient documentation

## 2012-07-26 DIAGNOSIS — Z9089 Acquired absence of other organs: Secondary | ICD-10-CM | POA: Insufficient documentation

## 2012-07-26 DIAGNOSIS — Z8742 Personal history of other diseases of the female genital tract: Secondary | ICD-10-CM | POA: Insufficient documentation

## 2012-07-26 DIAGNOSIS — R110 Nausea: Secondary | ICD-10-CM | POA: Insufficient documentation

## 2012-07-26 DIAGNOSIS — F17200 Nicotine dependence, unspecified, uncomplicated: Secondary | ICD-10-CM | POA: Insufficient documentation

## 2012-07-26 DIAGNOSIS — R197 Diarrhea, unspecified: Secondary | ICD-10-CM | POA: Insufficient documentation

## 2012-07-26 DIAGNOSIS — Z87440 Personal history of urinary (tract) infections: Secondary | ICD-10-CM | POA: Insufficient documentation

## 2012-07-26 DIAGNOSIS — Z3202 Encounter for pregnancy test, result negative: Secondary | ICD-10-CM | POA: Insufficient documentation

## 2012-07-26 LAB — POCT I-STAT, CHEM 8
BUN: 7 mg/dL (ref 6–23)
Calcium, Ion: 1.16 mmol/L (ref 1.12–1.23)
Chloride: 104 mEq/L (ref 96–112)
Creatinine, Ser: 0.9 mg/dL (ref 0.50–1.10)
Glucose, Bld: 100 mg/dL — ABNORMAL HIGH (ref 70–99)
HCT: 45 % (ref 36.0–46.0)
Hemoglobin: 15.3 g/dL — ABNORMAL HIGH (ref 12.0–15.0)
Potassium: 3.1 mEq/L — ABNORMAL LOW (ref 3.5–5.1)
Sodium: 141 meq/L (ref 135–145)
TCO2: 27 mmol/L (ref 0–100)

## 2012-07-26 LAB — URINALYSIS, ROUTINE W REFLEX MICROSCOPIC
Glucose, UA: NEGATIVE mg/dL
Ketones, ur: 15 mg/dL — AB
Nitrite: NEGATIVE
Protein, ur: 30 mg/dL — AB
Specific Gravity, Urine: 1.029 (ref 1.005–1.030)
Urobilinogen, UA: 0.2 mg/dL (ref 0.0–1.0)
pH: 6 (ref 5.0–8.0)

## 2012-07-26 LAB — URINE MICROSCOPIC-ADD ON

## 2012-07-26 LAB — CBC WITH DIFFERENTIAL/PLATELET
Basophils Absolute: 0 10*3/uL (ref 0.0–0.1)
Basophils Relative: 0 % (ref 0–1)
Eosinophils Absolute: 0 10*3/uL (ref 0.0–0.7)
Eosinophils Relative: 0 % (ref 0–5)
HCT: 41.9 % (ref 36.0–46.0)
Hemoglobin: 14.8 g/dL (ref 12.0–15.0)
Lymphocytes Relative: 9 % — ABNORMAL LOW (ref 12–46)
Lymphs Abs: 0.6 10*3/uL — ABNORMAL LOW (ref 0.7–4.0)
MCH: 31.4 pg (ref 26.0–34.0)
MCHC: 35.3 g/dL (ref 30.0–36.0)
MCV: 88.8 fL (ref 78.0–100.0)
Monocytes Absolute: 0.4 10*3/uL (ref 0.1–1.0)
Monocytes Relative: 6 % (ref 3–12)
Neutro Abs: 5.7 10*3/uL (ref 1.7–7.7)
Neutrophils Relative %: 86 % — ABNORMAL HIGH (ref 43–77)
Platelets: 192 10*3/uL (ref 150–400)
RBC: 4.72 MIL/uL (ref 3.87–5.11)
RDW: 12.3 % (ref 11.5–15.5)
WBC: 6.6 10*3/uL (ref 4.0–10.5)

## 2012-07-26 LAB — POCT PREGNANCY, URINE: Preg Test, Ur: NEGATIVE

## 2012-07-26 LAB — LIPASE, BLOOD: Lipase: 12 U/L (ref 11–59)

## 2012-07-26 MED ORDER — ONDANSETRON HCL 4 MG PO TABS: 4.0000 mg | ORAL_TABLET | Freq: Four times a day (QID) | ORAL | Status: DC

## 2012-07-26 MED ORDER — SODIUM CHLORIDE 0.9 % IV BOLUS (SEPSIS)
1000.0000 mL | Freq: Once | INTRAVENOUS | Status: AC
  Administered 2012-07-26: 1000 mL via INTRAVENOUS

## 2012-07-26 MED ORDER — MORPHINE SULFATE 4 MG/ML IJ SOLN
4.0000 mg | Freq: Once | INTRAMUSCULAR | Status: AC
  Administered 2012-07-26: 4 mg via INTRAVENOUS
  Filled 2012-07-26: qty 1

## 2012-07-26 MED ORDER — DICYCLOMINE HCL 20 MG PO TABS: 20.0000 mg | ORAL_TABLET | Freq: Two times a day (BID) | ORAL | Status: DC

## 2012-07-26 MED ORDER — SODIUM CHLORIDE 0.9 % IV BOLUS (SEPSIS)
500.0000 mL | Freq: Once | INTRAVENOUS | Status: AC
  Administered 2012-07-26: 500 mL via INTRAVENOUS

## 2012-07-26 MED ORDER — HYOSCYAMINE SULFATE 0.125 MG SL SUBL
0.1250 mg | SUBLINGUAL_TABLET | Freq: Once | SUBLINGUAL | Status: AC
  Administered 2012-07-26: 0.125 mg via SUBLINGUAL
  Filled 2012-07-26: qty 1

## 2012-07-26 NOTE — ED Provider Notes (Addendum)
History     CSN: [id number]  Arrival date & time 07/26/12  1191   First MD Initiated Contact with Patient 07/26/12 [phone]      Chief Complaint  Patient presents with  . Abdominal Pain    (Consider location/radiation/quality/duration/timing/severity/associated sxs/prior treatment) Patient is a 27 y.o. female presenting with abdominal pain. The history is provided by the patient. No language interpreter was used.  Abdominal Pain Pain location:  Generalized Pain quality: cramping and sharp   Duration:  1 day Associated symptoms: diarrhea and nausea   Associated symptoms: no chest pain, no dysuria, no fever, no melena, no shortness of breath, no vaginal discharge and no vomiting   Associated symptoms comment:  Generalized sharp abdominal cramping associated with multiple bowel movements since yesterday. Nausea without vomiting, no fever at home. She denies any blood in her bowel movements. She states that her son had diarrhea recently without cramping or fever.    Past Medical History  Diagnosis Date  . Hypertension   . UTI (urinary tract infection)   . Ovarian cyst     Past Surgical History  Procedure Laterality Date  . Appendectomy    . Ovarian cyst surgery  2009  . Vaginal delivery  2009    No family history on file.  History  Substance Use Topics  . Smoking status: Current Every Day Smoker -- 0.50 packs/day  . Smokeless tobacco: Not on file  . Alcohol Use: Yes     Comment: occassional     OB History   Grav Para Term Preterm Abortions TAB SAB Ect Mult Living                  Review of Systems  Constitutional: Negative for fever.  Respiratory: Negative for shortness of breath.   Cardiovascular: Negative for chest pain.  Gastrointestinal: Positive for nausea, abdominal pain and diarrhea. Negative for vomiting, blood in stool, melena and abdominal distention.  Genitourinary: Negative for dysuria and vaginal discharge.  Musculoskeletal: Negative for back pain.   Psychiatric/Behavioral: Negative for confusion.    Allergies  Review of patient's allergies indicates no known allergies.  Home Medications  No current outpatient prescriptions on file.  BP 134/69  Pulse 120  Temp(Src) 100.3 F (37.9 C) (Oral)  Resp 18  SpO2 99%  LMP 07/23/2012  Physical Exam  Constitutional: She appears well-developed and well-nourished.  HENT:  Head: Normocephalic.  Neck: Normal range of motion. Neck supple.  Cardiovascular: Normal rate and regular rhythm.   Pulmonary/Chest: Effort normal and breath sounds normal.  Abdominal: Soft. Bowel sounds are normal. There is no rebound and no guarding.  Generalized tenderness without distention or mass. BS active.  Musculoskeletal: Normal range of motion.  Neurological: She is alert. No cranial nerve deficit.  Skin: Skin is warm and dry. No rash noted.  Psychiatric: She has a normal mood and affect.    ED Course  Procedures (including critical care time)  Labs Reviewed  CBC WITH DIFFERENTIAL - Abnormal; Notable for the following:    Neutrophils Relative 86 (*)    Lymphocytes Relative 9 (*)    Lymphs Abs 0.6 (*)    All other components within normal limits  POCT I-STAT, CHEM 8 - Abnormal; Notable for the following:    Potassium 3.1 (*)    Glucose, Bld 100 (*)    Hemoglobin 15.3 (*)    All other components within normal limits  LIPASE, BLOOD  URINALYSIS, ROUTINE W REFLEX MICROSCOPIC  CBC WITH DIFFERENTIAL  BASIC  METABOLIC PANEL  PREGNANCY, URINE  POCT PREGNANCY, URINE   Results for orders placed during the hospital encounter of 07/26/12  CBC WITH DIFFERENTIAL      Result Value Range   WBC 6.6  4.0 - 10.5 K/uL   RBC 4.72  3.87 - 5.11 MIL/uL   Hemoglobin 14.8  12.0 - 15.0 g/dL   HCT 16.1  09.6 - 04.5 %   MCV 88.8  78.0 - 100.0 fL   MCH 31.4  26.0 - 34.0 pg   MCHC 35.3  30.0 - 36.0 g/dL   RDW 40.9  81.1 - 91.4 %   Platelets 192  150 - 400 K/uL   Neutrophils Relative 86 (*) 43 - 77 %   Neutro Abs  5.7  1.7 - 7.7 K/uL   Lymphocytes Relative 9 (*) 12 - 46 %   Lymphs Abs 0.6 (*) 0.7 - 4.0 K/uL   Monocytes Relative 6  3 - 12 %   Monocytes Absolute 0.4  0.1 - 1.0 K/uL   Eosinophils Relative 0  0 - 5 %   Eosinophils Absolute 0.0  0.0 - 0.7 K/uL   Basophils Relative 0  0 - 1 %   Basophils Absolute 0.0  0.0 - 0.1 K/uL  URINALYSIS, ROUTINE W REFLEX MICROSCOPIC      Result Value Range   Color, Urine AMBER (*) YELLOW   APPearance CLOUDY (*) CLEAR   Specific Gravity, Urine 1.029  1.005 - 1.030   pH 6.0  5.0 - 8.0   Glucose, UA NEGATIVE  NEGATIVE mg/dL   Hgb urine dipstick LARGE (*) NEGATIVE   Bilirubin Urine SMALL (*) NEGATIVE   Ketones, ur 15 (*) NEGATIVE mg/dL   Protein, ur 30 (*) NEGATIVE mg/dL   Urobilinogen, UA 0.2  0.0 - 1.0 mg/dL   Nitrite NEGATIVE  NEGATIVE   Leukocytes, UA SMALL (*) NEGATIVE  LIPASE, BLOOD      Result Value Range   Lipase 12  11 - 59 U/L  URINE MICROSCOPIC-ADD ON      Result Value Range   Squamous Epithelial / LPF MANY (*) RARE   WBC, UA 3-6  <3 WBC/hpf   RBC / HPF 3-6  <3 RBC/hpf   Bacteria, UA FEW (*) RARE   Casts HYALINE CASTS (*) NEGATIVE   Urine-Other MUCOUS PRESENT    POCT I-STAT, CHEM 8      Result Value Range   Sodium 141  135 - 145 mEq/L   Potassium 3.1 (*) 3.5 - 5.1 mEq/L   Chloride 104  96 - 112 mEq/L   BUN 7  6 - 23 mg/dL   Creatinine, Ser 7.82  0.50 - 1.10 mg/dL   Glucose, Bld 956 (*) 70 - 99 mg/dL   Calcium, Ion 2.13  0.86 - 1.23 mmol/L   TCO2 27  0 - 100 mmol/L   Hemoglobin 15.3 (*) 12.0 - 15.0 g/dL   HCT 57.8  46.9 - 62.9 %  POCT PREGNANCY, URINE      Result Value Range   Preg Test, Ur NEGATIVE  NEGATIVE    No results found.   No diagnosis found.  1. Diarrhea 2. Viral enteritis    MDM  Re-evaluation - She reports improvement with medication and IV fluids. She continues to have abdominal cramping. No further diarrhea in the ED. Vital signs improved with no more tachycardia. Will give something for pain and additional  500cc fluids and anticipate discharge home with comfort medications.  Morphine given. She continues  to complain of cramping. No further diarrhea. Discussed that symptoms would improve over time. VSS. Stable for discharge.        Arnoldo Hooker, PA-C 07/26/12 1224  Arnoldo Hooker, PA-C 08/24/12 1804

## 2012-07-26 NOTE — ED Provider Notes (Signed)
Medical screening examination/treatment/procedure(s) were performed by non-physician practitioner and as supervising physician I was immediately available for consultation/collaboration.  Stephen Kohut, MD 07/26/12 1514 

## 2012-07-26 NOTE — ED Notes (Signed)
Pt c/o lower abdominal pain with nausea and diarrhea onset yesterday. Pt has a son with similar symptoms.

## 2012-07-27 LAB — URINE CULTURE: Colony Count: 70000

## 2012-08-25 NOTE — ED Provider Notes (Signed)
Medical screening examination/treatment/procedure(s) were performed by non-physician practitioner and as supervising physician I was immediately available for consultation/collaboration.   Gavin Pound. Oletta Lamas, MD 08/25/12 [phone]

## 2013-11-15 ENCOUNTER — Encounter (HOSPITAL_COMMUNITY): Payer: Self-pay | Admitting: Emergency Medicine

## 2013-11-15 ENCOUNTER — Emergency Department (INDEPENDENT_AMBULATORY_CARE_PROVIDER_SITE_OTHER)
Admission: EM | Admit: 2013-11-15 | Discharge: 2013-11-15 | Disposition: A | Discharge status: Home / Self Care | Payer: Self-pay | Source: Home / Self Care | Attending: Family Medicine | Admitting: Family Medicine

## 2013-11-15 DIAGNOSIS — L049 Acute lymphadenitis, unspecified: Secondary | ICD-10-CM

## 2013-11-15 MED ORDER — MINOCYCLINE HCL 100 MG PO CAPS: 100.0000 mg | ORAL_CAPSULE | Freq: Two times a day (BID) | ORAL | Status: DC

## 2013-11-15 NOTE — ED Provider Notes (Signed)
CSN: [id number]     Arrival date & time 11/15/13  1333 History   First MD Initiated Contact with Patient 11/15/13 1426     Chief Complaint  Patient presents with  . Lymphadenopathy   (Consider location/radiation/quality/duration/timing/severity/associated sxs/prior Treatment) Patient is a 28 y.o. female presenting with general illness. The history is provided by the patient.  Illness Location:  Noticed sts to left neck x 2 weeks, persistent, comes today for eval. Severity:  Mild Onset quality:  Gradual Progression:  Unchanged Chronicity:  New Associated symptoms: no ear pain, no fever, no rash and no sore throat     Past Medical History  Diagnosis Date  . Hypertension   . UTI (urinary tract infection)   . Ovarian cyst    Past Surgical History  Procedure Laterality Date  . Appendectomy    . Ovarian cyst surgery  2009  . Vaginal delivery  2009   No family history on file. History  Substance Use Topics  . Smoking status: Current Every Day Smoker -- 0.50 packs/day  . Smokeless tobacco: Not on file  . Alcohol Use: Yes     Comment: occassional    OB History   Grav Para Term Preterm Abortions TAB SAB Ect Mult Living                 Review of Systems  Constitutional: Negative.  Negative for fever.  HENT: Negative for ear pain and sore throat.   Skin: Negative for rash.  Hematological: Positive for adenopathy.    Allergies  Review of patient's allergies indicates no known allergies.  Home Medications   Prior to Admission medications   Medication Sig Start Date End Date Taking? Authorizing Provider  dicyclomine (BENTYL) 20 MG tablet Take 1 tablet (20 mg total) by mouth 2 (two) times daily. 07/26/12   Shari A Upstill, PA-C  minocycline (MINOCIN,DYNACIN) 100 MG capsule Take 1 capsule (100 mg total) by mouth 2 (two) times daily. 11/15/13   Billy Fischer, MD  ondansetron (ZOFRAN) 4 MG tablet Take 1 tablet (4 mg total) by mouth every 6 (six) hours. 07/26/12   Shari A Upstill,  PA-C   BP 123/86  Pulse 81  Temp(Src) 99 F (37.2 C) (Oral)  Resp 98  SpO2 98% Physical Exam  Nursing note and vitals reviewed. Constitutional: She is oriented to person, place, and time. She appears well-developed and well-nourished.  HENT:  Right Ear: External ear normal.  Left Ear: External ear normal.  Mouth/Throat: Oropharynx is clear and moist.  Eyes: Conjunctivae are normal. Pupils are equal, round, and reactive to light.  Neck: Normal range of motion. Neck supple.  49mm left pc node, sl tender, mobile, no other nodes palp.  Lymphadenopathy:    She has cervical adenopathy.  Neurological: She is alert and oriented to person, place, and time.  Skin: Skin is warm and dry.    ED Course  Procedures (including critical care time) Labs Review Labs Reviewed - No data to display  Imaging Review No results found.   MDM   1. Lymphadenitis, acute       Billy Fischer, MD 11/15/13 1504

## 2013-11-15 NOTE — ED Notes (Signed)
Pt c/o swollen lymph on left side of neck onset 2 weeks Denies fevers Having mild pain; 6/10 Alert w/no signs of acute distress.

## 2013-11-15 NOTE — Discharge Instructions (Signed)
Take all of medicine and return or see listed referral for further eval.

## 2014-01-16 ENCOUNTER — Emergency Department (HOSPITAL_COMMUNITY)
Admission: EM | Admit: 2014-01-16 | Discharge: 2014-01-16 | Disposition: A | Discharge status: Home / Self Care | Payer: BC Managed Care – PPO | Attending: Emergency Medicine | Admitting: Emergency Medicine

## 2014-01-16 ENCOUNTER — Encounter (HOSPITAL_COMMUNITY): Payer: Self-pay | Admitting: Emergency Medicine

## 2014-01-16 DIAGNOSIS — Z3202 Encounter for pregnancy test, result negative: Secondary | ICD-10-CM | POA: Insufficient documentation

## 2014-01-16 DIAGNOSIS — Z87440 Personal history of urinary (tract) infections: Secondary | ICD-10-CM | POA: Insufficient documentation

## 2014-01-16 DIAGNOSIS — F17200 Nicotine dependence, unspecified, uncomplicated: Secondary | ICD-10-CM | POA: Diagnosis not present

## 2014-01-16 DIAGNOSIS — I10 Essential (primary) hypertension: Secondary | ICD-10-CM | POA: Diagnosis not present

## 2014-01-16 DIAGNOSIS — N898 Other specified noninflammatory disorders of vagina: Secondary | ICD-10-CM | POA: Insufficient documentation

## 2014-01-16 DIAGNOSIS — Z8742 Personal history of other diseases of the female genital tract: Secondary | ICD-10-CM | POA: Insufficient documentation

## 2014-01-16 LAB — HIV ANTIBODY (ROUTINE TESTING W REFLEX): HIV 1&2 Ab, 4th Generation: NONREACTIVE

## 2014-01-16 LAB — URINALYSIS, ROUTINE W REFLEX MICROSCOPIC
Bilirubin Urine: NEGATIVE
GLUCOSE, UA: NEGATIVE mg/dL
KETONES UR: 15 mg/dL — AB
Leukocytes, UA: NEGATIVE
NITRITE: NEGATIVE
PROTEIN: NEGATIVE mg/dL
Specific Gravity, Urine: 1.028 (ref 1.005–1.030)
Urobilinogen, UA: 1 mg/dL (ref 0.0–1.0)
pH: 5.5 (ref 5.0–8.0)

## 2014-01-16 LAB — WET PREP, GENITAL
Clue Cells Wet Prep HPF POC: NONE SEEN
Trich, Wet Prep: NONE SEEN
Yeast Wet Prep HPF POC: NONE SEEN

## 2014-01-16 LAB — URINE MICROSCOPIC-ADD ON

## 2014-01-16 LAB — RPR

## 2014-01-16 LAB — POC URINE PREG, ED: Preg Test, Ur: NEGATIVE

## 2014-01-16 MED ORDER — ACETAMINOPHEN 325 MG PO TABS
650.0000 mg | ORAL_TABLET | Freq: Once | ORAL | Status: AC
  Administered 2014-01-16: 650 mg via ORAL
  Filled 2014-01-16: qty 2

## 2014-01-16 NOTE — Discharge Instructions (Signed)
Cultures have been sent out of the discharge. Please give the hospital 2-3 days. You will be called if infection is found.

## 2014-01-16 NOTE — ED Provider Notes (Signed)
CSN: [id number]     Arrival date & time 01/16/14  1004 History   First MD Initiated Contact with Patient 01/16/14 1106     Chief Complaint  Patient presents with  . Abdominal Pain  . Vaginal Discharge     (Consider location/radiation/quality/duration/timing/severity/associated sxs/prior Treatment) HPI  Patient to the ER with complaints of vaginal discharge and suprapubic ain for the past 3 days. She reports the discharge started off as white and has now been clear over the past few days. It does not itch or cause her pain but she has had associated low back pain and suprapubic pain. She feels as though her urine has been cloudy. No urinary symptoms, fever, vaginal bleeding, nausea, vomiting or diarrhea. Hx of removal of ovarian cysts. Has had her appendix and vaginal delivery. LMP: 7/18.15  Past Medical History  Diagnosis Date  . Hypertension   . UTI (urinary tract infection)   . Ovarian cyst    Past Surgical History  Procedure Laterality Date  . Appendectomy    . Ovarian cyst surgery  2009  . Vaginal delivery  2009   No family history on file. History  Substance Use Topics  . Smoking status: Current Every Day Smoker -- 0.50 packs/day  . Smokeless tobacco: Not on file  . Alcohol Use: Yes     Comment: occassional    OB History   Grav Para Term Preterm Abortions TAB SAB Ect Mult Living                 Review of Systems   Review of Systems  Gen: no weight loss, fevers, chills, night sweats  Eyes: no occular draining, occular pain,  No visual changes  Nose: no epistaxis or rhinorrhea  Mouth: no dental pain, no sore throat  Neck: no neck pain  Lungs: No hemoptysis. No wheezing or coughing CV:  No palpitations, dependent edema or orthopnea. No chest pain Abd: no diarrhea. No nausea or vomiting, No abdominal pain  GU: no dysuria or gross hematuria + vaginal discharge and suprapubic pain MSK:  No muscle weakness, No muscular pain Neuro: no headache, no focal neurologic  deficits  Skin: no rash , no wounds Psyche: no complaints of depression or anxiety    Allergies  Review of patient's allergies indicates no known allergies.  Home Medications   Prior to Admission medications   Not on File   BP 140/95  Pulse 63  Temp(Src) 98.2 F (36.8 C) (Oral)  Resp 20  Ht 5\' 8"  (1.727 m)  Wt 185 lb (83.915 kg)  BMI 28.14 kg/m2  SpO2 100%  LMP 12/11/2013 Physical Exam  Nursing note and vitals reviewed. Constitutional: She appears well-developed and well-nourished. No distress.  HENT:  Head: Normocephalic and atraumatic.  Eyes: Pupils are equal, round, and reactive to light.  Neck: Normal range of motion. Neck supple.  Cardiovascular: Normal rate and regular rhythm.   Pulmonary/Chest: Effort normal.  Abdominal: Soft. There is tenderness in the suprapubic area. There is no guarding and no CVA tenderness. Hernia confirmed negative in the right inguinal area and confirmed negative in the left inguinal area.  Genitourinary: Uterus normal. Cervix exhibits discharge. Cervix exhibits no motion tenderness and no friability. Right adnexum displays no mass, no tenderness and no fullness. Left adnexum displays no mass, no tenderness and no fullness. No tenderness or bleeding around the vagina. No foreign body around the vagina. Vaginal discharge found.  Lymphadenopathy:       Right: No inguinal adenopathy present.  Left: No inguinal adenopathy present.  Neurological: She is alert.  Skin: Skin is warm and dry.    ED Course  Procedures (including critical care time) Labs Review Labs Reviewed  WET PREP, GENITAL - Abnormal; Notable for the following:    WBC, Wet Prep HPF POC RARE (*)    All other components within normal limits  URINALYSIS, ROUTINE W REFLEX MICROSCOPIC - Abnormal; Notable for the following:    APPearance CLOUDY (*)    Hgb urine dipstick SMALL (*)    Ketones, ur 15 (*)    All other components within normal limits  URINE MICROSCOPIC-ADD ON -  Abnormal; Notable for the following:    Squamous Epithelial / LPF MANY (*)    All other components within normal limits  GC/CHLAMYDIA PROBE AMP  HIV ANTIBODY (ROUTINE TESTING)  RPR  POC URINE PREG, ED    Imaging Review No results found.   EKG Interpretation None      MDM   Final diagnoses:  Vaginal discharge    Patients labs are reassuring. No infection noted on wet prep or urine, cultures sent out. At this time the Tylenol helped the patients pain and she will follow-up at [facility] outpatient clinic. Urine pregnancy is negative.  28 y.o.Jozee Hammer Starlin's evaluation in the Emergency Department is complete. It has been determined that no acute conditions requiring further emergency intervention are present at this time. The patient/guardian have been advised of the diagnosis and plan. We have discussed signs and symptoms that warrant return to the ED, such as changes or worsening in symptoms.  Vital signs are stable at discharge. Filed Vitals:   01/16/14 1322  BP: 140/95  Pulse: 67  Temp:   Resp: 18    Patient/guardian has voiced understanding and agreed to follow-up with the PCP or specialist.     Linus Mako, PA-C 01/16/14 1325

## 2014-01-16 NOTE — ED Provider Notes (Signed)
Medical screening examination/treatment/procedure(s) were performed by non-physician practitioner and as supervising physician I was immediately available for consultation/collaboration.   EKG Interpretation None       Threasa Beards, MD 01/16/14 1327

## 2014-01-16 NOTE — ED Notes (Signed)
Pt is here with lower abdominal pain and vaginal discharge for the last three days.  LMP:  12/11/13

## 2014-01-18 LAB — GC/CHLAMYDIA PROBE AMP
CT PROBE, AMP APTIMA: NEGATIVE
GC PROBE AMP APTIMA: NEGATIVE

## 2014-09-17 ENCOUNTER — Emergency Department (HOSPITAL_COMMUNITY): Payer: Medicaid - Out of State

## 2014-09-17 ENCOUNTER — Encounter (HOSPITAL_COMMUNITY): Payer: Self-pay

## 2014-09-17 ENCOUNTER — Emergency Department (HOSPITAL_COMMUNITY)
Admission: EM | Admit: 2014-09-17 | Discharge: 2014-09-17 | Disposition: A | Discharge status: Home / Self Care | Payer: Medicaid - Out of State | Attending: Emergency Medicine | Admitting: Emergency Medicine

## 2014-09-17 DIAGNOSIS — Z720 Tobacco use: Secondary | ICD-10-CM | POA: Insufficient documentation

## 2014-09-17 DIAGNOSIS — Z87440 Personal history of urinary (tract) infections: Secondary | ICD-10-CM | POA: Insufficient documentation

## 2014-09-17 DIAGNOSIS — R0602 Shortness of breath: Secondary | ICD-10-CM

## 2014-09-17 DIAGNOSIS — J302 Other seasonal allergic rhinitis: Secondary | ICD-10-CM | POA: Insufficient documentation

## 2014-09-17 DIAGNOSIS — I10 Essential (primary) hypertension: Secondary | ICD-10-CM | POA: Insufficient documentation

## 2014-09-17 DIAGNOSIS — Z8742 Personal history of other diseases of the female genital tract: Secondary | ICD-10-CM | POA: Insufficient documentation

## 2014-09-17 DIAGNOSIS — R0600 Dyspnea, unspecified: Secondary | ICD-10-CM

## 2014-09-17 LAB — I-STAT CHEM 8, ED
BUN: 13 mg/dL (ref 6–23)
CHLORIDE: 102 mmol/L (ref 96–112)
Calcium, Ion: 1.19 mmol/L (ref 1.12–1.23)
Creatinine, Ser: 1.2 mg/dL — ABNORMAL HIGH (ref 0.50–1.10)
Glucose, Bld: 118 mg/dL — ABNORMAL HIGH (ref 70–99)
HEMATOCRIT: 40 % (ref 36.0–46.0)
Hemoglobin: 13.6 g/dL (ref 12.0–15.0)
Potassium: 3.6 mmol/L (ref 3.5–5.1)
SODIUM: 140 mmol/L (ref 135–145)
TCO2: 23 mmol/L (ref 0–100)

## 2014-09-17 LAB — I-STAT TROPONIN, ED: Troponin i, poc: 0 ng/mL (ref 0.00–0.08)

## 2014-09-17 LAB — RAPID STREP SCREEN (MED CTR MEBANE ONLY): Streptococcus, Group A Screen (Direct): NEGATIVE

## 2014-09-17 MED ORDER — ALBUTEROL SULFATE HFA 108 (90 BASE) MCG/ACT IN AERS
2.0000 | INHALATION_SPRAY | RESPIRATORY_TRACT | Status: DC | PRN
  Administered 2014-09-17: 2 via RESPIRATORY_TRACT
  Filled 2014-09-17: qty 6.7

## 2014-09-17 NOTE — ED Notes (Signed)
Pt transported to xray 

## 2014-09-17 NOTE — ED Provider Notes (Signed)
CSN: [id number]     Arrival date & time 09/17/14  1447 History   First MD Initiated Contact with Patient 09/17/14 1529     Chief Complaint  Patient presents with  . Shortness of Breath  . Sore Throat     (Consider location/radiation/quality/duration/timing/severity/associated sxs/prior Treatment) HPI 29 year old female who comes in today stating that she has about like she has had difficulty getting a deep breath for the past 4 weeks. She states that she feels like her throat is tight. Her throat feels somewhat uncomfortable. She is a smoker but feels like she has not been able to smoke for the past 2 weeks because it makes her throat more sore. She states it is worse when she is driving in the car with the windows down. She denies any history of asthma, allergy problems, DVT, PE, swelling in her legs, chest pain, long trips or immobilization. Past Medical History  Diagnosis Date  . Hypertension   . UTI (urinary tract infection)   . Ovarian cyst    Past Surgical History  Procedure Laterality Date  . Appendectomy    . Ovarian cyst surgery  2009  . Vaginal delivery  2009   No family history on file. History  Substance Use Topics  . Smoking status: Current Every Day Smoker -- 0.50 packs/day  . Smokeless tobacco: Not on file  . Alcohol Use: Yes     Comment: occassional    OB History    No data available     Review of Systems  All other systems reviewed and are negative.   lmp 4/4 normal cycle at normal time  Allergies  Review of patient's allergies indicates no known allergies.  Home Medications   Prior to Admission medications   Not on File   BP 146/87 mmHg  Pulse 92  Temp(Src) 98.5 F (36.9 C)  Resp 18  Ht 5\' 8"  (1.727 m)  Wt 181 lb (82.101 kg)  BMI 27.53 kg/m2  SpO2 100%  LMP 08/27/2014 Physical Exam  Constitutional: She is oriented to person, place, and time. She appears well-developed and well-nourished.  HENT:  Head: Normocephalic and atraumatic.   Right Ear: External ear normal.  Left Ear: External ear normal.  Nose: Nose normal.  Mouth/Throat: Oropharynx is clear and moist.  Eyes: Conjunctivae and EOM are normal. Pupils are equal, round, and reactive to light.  Neck: Normal range of motion. Neck supple.  Cardiovascular: Normal rate, regular rhythm, normal heart sounds and intact distal pulses.   Pulmonary/Chest: Effort normal and breath sounds normal.  Abdominal: Soft. Bowel sounds are normal.  Musculoskeletal: Normal range of motion.  Neurological: She is alert and oriented to person, place, and time. She has normal reflexes.  Skin: Skin is warm and dry.  Psychiatric: She has a normal mood and affect. Her behavior is normal. Judgment and thought content normal.  Nursing note and vitals reviewed.   ED Course  Procedures (including critical care time) Labs Review Labs Reviewed  RAPID STREP SCREEN  CULTURE, GROUP A STREP  I-STAT [place], ED  I-STAT CHEM 8, ED    Imaging Review Dg Chest 2 View  09/17/2014   CLINICAL DATA:  Shortness of breath for 4 weeks.  EXAM: CHEST  2 VIEW  COMPARISON:  07/22/2005  FINDINGS: The heart size and mediastinal contours are within normal limits. Both lungs are clear. The visualized skeletal structures are unremarkable.  IMPRESSION: No active cardiopulmonary disease.   Electronically Signed   By: Kathreen Devoid  On: 09/17/2014 16:33     EKG Interpretation   Date/Time:  Saturday September 17 2014 15:30:44 EDT Ventricular Rate:  78 PR Interval:  130 QRS Duration: 84 QT Interval:  384 QTC Calculation: 437 R Axis:   48 Text Interpretation:  Normal sinus rhythm Normal ECG Confirmed by Roschelle Calandra MD,  Andee Poles ([id number]) on 09/17/2014 4:51:40 PM      MDM   Final diagnoses:  SOB (shortness of breath)   29 year old female complains of feeling short of breath with throat scratching and irritation. This is worse when her windows open. Given the severe allergy season this spring, I suspect that she is  having some seasonal allergy symptoms. She is given an albuterol HFA here. She is advised to take over-the-counter allergy medicine. She is advised regarding need for follow-up and return precautions such as worsening dyspnea, fever, or pain and voices understanding.  Discussed with patient need for recheck of creatinine and blood sugar.     Pattricia Boss, MD 09/17/14 [phone]

## 2014-09-17 NOTE — ED Provider Notes (Signed)
Patient here complaining of ongoing shortness of breath, intermittent over the past 4 weeks. No aggravating or alleviating factors. Not necessarily worse with exertion. Patient does not endorse dyspnea on exertion, denies chest pain, fever, chills, cough, dizziness, weakness, palpitations, abdominal pain, nausea, vomiting. Patient also reports one day of sore throat.  PE: Constitutional: well-developed, well-nourished, no apparent distress HENT: normocephalic, atraumatic. Tonsillar exudates and posterior oropharyngeal erythema noted. Cardiovascular: normal rate and rhythm, distal pulses intact Pulmonary/Chest: effort normal; breath sounds clear and equal bilaterally; no wheezes or rales Abdominal: soft and nontender Musculoskeletal: full ROM, no edema Lymphadenopathy: no cervical adenopathy Neurological: alert with goal directed thinking Skin: warm and dry, no rash, no diaphoresis Psychiatric: normal mood and affect, normal behavior   Patient well-appearing, afebrile, hemodynamically stable and in no acute distress. Patient does not have any pulmonary history, does not have any obvious signs of infection, given vague complaint of shortness of breath she may need further workup with labs and chest x-ray.  Not appropriate for fast track at this time.  Signed,  Dahlia Bailiff, PA-C 3:26 PM   Dahlia Bailiff, PA-C 09/17/14 1723  Orpah Greek, MD 09/18/14 [phone]

## 2014-09-17 NOTE — ED Notes (Signed)
Pt here for sore throat that started hurting yesterday. Hurts to swallow. Has the start of white stuff on the back of her throat.

## 2014-09-17 NOTE — ED Notes (Signed)
Patient states she was not here to be seen for a sore throat she is here for SOB. PA notified to evaluate if appropriate for Fast track

## 2014-09-17 NOTE — Discharge Instructions (Signed)
You're blood sugar and creatinine were slightly elevated and should be rechecked in the next 1-2 weeks.   Shortness of Breath Shortness of breath means you have trouble breathing. Shortness of breath needs medical care right away. HOME CARE   Do not smoke.  Avoid being around chemicals or things (paint fumes, dust) that may bother your breathing.  Rest as needed. Slowly begin your normal activities.  Only take medicines as told by your doctor.  Keep all doctor visits as told. GET HELP RIGHT AWAY IF:   Your shortness of breath gets worse.  You feel lightheaded, pass out (faint), or have a cough that is not helped by medicine.  You cough up blood.  You have pain with breathing.  You have pain in your chest, arms, shoulders, or belly (abdomen).  You have a fever.  You cannot walk up stairs or exercise the way you normally do.  You do not get better in the time expected.  You have a hard time doing normal activities even with rest.  You have problems with your medicines.  You have any new symptoms. MAKE SURE YOU:  Understand these instructions.  Will watch your condition.  Will get help right away if you are not doing well or get worse. Document Released: 10/30/2007 Document Revised: 05/18/2013 Document Reviewed: 07/29/2011 [facility] Patient Information 2015 [place], [place]. This information is not intended to replace advice given to you by your health care provider. Make sure you discuss any questions you have with your health care provider. Hay Fever  Hay fever is a type of allergy that people have to things like grass, animals, or pollen from plants and flowers. It cannot be passed from one person to another. You cannot cure hay fever, but there are things that may help relieve your problems (symptoms). HOME CARE  Avoid the things that may be causing your problems.  Take all medicine as told by your doctor. GET HELP RIGHT AWAY IF:  You have asthma, a cough, and  you start making whistling sounds when breathing (wheezing).  Your tongue or lips are puffy (swollen).  You have trouble breathing.  You feel lightheaded or like you will pass out (faint).  You have a fever.  Your problems are getting worse and your medicine is not helping.  Your treatment was working, but your problems have come back.  You are stuffed up (congested) and have pressure in your face.  You have a headache.  You have cold sweats. MAKE SURE YOU:  Understand these instructions.  Will watch your condition.  Will get help right away if you are not doing well or get worse. Document Released: 09/12/2010 Document Revised: 08/05/2011 Document Reviewed: 09/12/2010 [facility] Patient Information 2015 [place], [place]. This information is not intended to replace advice given to you by your health care provider. Make sure you discuss any questions you have with your health care provider.

## 2014-09-19 LAB — CULTURE, GROUP A STREP: Strep A Culture: POSITIVE — AB

## 2014-09-20 ENCOUNTER — Telehealth (HOSPITAL_COMMUNITY): Payer: Self-pay | Admitting: *Deleted

## 2014-09-20 NOTE — Progress Notes (Signed)
ED Antimicrobial Stewardship Positive Culture Follow Up   Ann Bruce is an 29 y.o. female who presented to [facility] on 09/17/2014 with a chief complaint of sore throat, SOB Chief Complaint  Patient presents with  . Shortness of Breath  . Sore Throat    Recent Results (from the past 720 hour(s))  Rapid strep screen     Status: None   Collection Time: 09/17/14  3:16 PM  Result Value Ref Range Status   Streptococcus, Group A Screen (Direct) NEGATIVE NEGATIVE Final    Comment: (NOTE) A Rapid Antigen test may result negative if the antigen level in the sample is below the detection level of this test. The FDA has not cleared this test as a stand-alone test therefore the rapid antigen negative result has reflexed to a Group A Strep culture.   Culture, Group A Strep     Status: Abnormal   Collection Time: 09/17/14  3:16 PM  Result Value Ref Range Status   Strep A Culture Positive (A)  Final    Comment: (NOTE) Penicillin and ampicillin are drugs of choice for treatment of beta-hemolytic streptococcal infections. Susceptibility testing of penicillins and other beta-lactam agents approved by the FDA for treatment of beta-hemolytic streptococcal infections need not be performed routinely because nonsusceptible isolates are extremely rare in any beta-hemolytic streptococcus and have not been reported for Streptococcus pyogenes (group A). (CLSI 2011) Performed At: [facility] [place], [place] [id number] Lindon Romp MD EH:[id number]    [x]  Patient discharged originally without antimicrobial agent and treatment is now indicated  29 y/o F with sore throat and SOB.  Rapid strep was neg, but cultures grew Group A strep.   New antibiotic prescription: Amoxicillin 500 mg capsules Take 1 capsule (500mg ) every 12 hrs x 10 days  ED Provider: Alvina Chou PA-C  Angela Burke, PharmD Candidate

## 2014-10-05 ENCOUNTER — Telehealth (HOSPITAL_BASED_OUTPATIENT_CLINIC_OR_DEPARTMENT_OTHER): Payer: Self-pay | Admitting: Emergency Medicine

## 2014-10-05 NOTE — Telephone Encounter (Signed)
Lost to followup 

## 2015-04-14 ENCOUNTER — Encounter (HOSPITAL_COMMUNITY): Payer: Self-pay | Admitting: *Deleted

## 2015-04-14 ENCOUNTER — Emergency Department (HOSPITAL_COMMUNITY)
Admission: EM | Admit: 2015-04-14 | Discharge: 2015-04-14 | Disposition: A | Discharge status: Home / Self Care | Payer: Medicaid - Out of State | Attending: Emergency Medicine | Admitting: Emergency Medicine

## 2015-04-14 DIAGNOSIS — F17200 Nicotine dependence, unspecified, uncomplicated: Secondary | ICD-10-CM | POA: Insufficient documentation

## 2015-04-14 DIAGNOSIS — Z87440 Personal history of urinary (tract) infections: Secondary | ICD-10-CM | POA: Insufficient documentation

## 2015-04-14 DIAGNOSIS — Z8742 Personal history of other diseases of the female genital tract: Secondary | ICD-10-CM | POA: Insufficient documentation

## 2015-04-14 DIAGNOSIS — I10 Essential (primary) hypertension: Secondary | ICD-10-CM | POA: Insufficient documentation

## 2015-04-14 DIAGNOSIS — K029 Dental caries, unspecified: Secondary | ICD-10-CM

## 2015-04-14 MED ORDER — AMOXICILLIN-POT CLAVULANATE 875-125 MG PO TABS: 1.0000 | ORAL_TABLET | Freq: Two times a day (BID) | ORAL | Status: DC

## 2015-04-14 MED ORDER — HYDROCODONE-ACETAMINOPHEN 5-325 MG PO TABS: 2.0000 | ORAL_TABLET | ORAL | Status: DC | PRN

## 2015-04-14 NOTE — ED Provider Notes (Signed)
CSN: [id number]     Arrival date & time 04/14/15  0756 History   First MD Initiated Contact with Patient 04/14/15 0800     Chief Complaint  Patient presents with  . Dental Pain     (Consider location/radiation/quality/duration/timing/severity/associated sxs/prior Treatment) HPI  Ann Bruce is a 29 y.o F with no significant past medical history who presents to the emergency department today complaining of dental pain. Patient states that she has had cavities in her teeth for several years but has not seen a dentist due to lack of insurance. Patient states that 4 days ago her back right molar began aching and has progressively gotten more painful. Denies fever, difficulty swallowing, difficulty breathing, facial swelling, swelling under the tongue.  Past Medical History  Diagnosis Date  . Hypertension   . UTI (urinary tract infection)   . Ovarian cyst    Past Surgical History  Procedure Laterality Date  . Appendectomy    . Ovarian cyst surgery  2009  . Vaginal delivery  2009   History reviewed. No pertinent family history. Social History  Substance Use Topics  . Smoking status: Current Every Day Smoker -- 0.50 packs/day  . Smokeless tobacco: None  . Alcohol Use: Yes     Comment: occassional    OB History    No data available     Review of Systems  All other systems reviewed and are negative.     Allergies  Review of patient's allergies indicates no known allergies.  Home Medications   Prior to Admission medications   Medication Sig Start Date End Date Taking? Authorizing Provider  amoxicillin-clavulanate (AUGMENTIN) 875-125 MG tablet Take 1 tablet by mouth every 12 (twelve) hours. 04/14/15   Jowanda Heeg [facility]pp [facility], PA-C  HYDROcodone-acetaminophen (NORCO/VICODIN) 5-325 MG tablet Take 2 tablets by mouth every 4 (four) hours as needed. 04/14/15   Mabeline Varas Tripp Kaylene Dawn, PA-C   BP 137/82 mmHg  Pulse 76  Temp(Src) 98.6 F (37 C) (Oral)  Resp 18  Ht 5\' 8"  (1.727  m)  SpO2 100% Physical Exam  Constitutional: She is oriented to person, place, and time. She appears well-developed and well-nourished. No distress.  HENT:  Head: Normocephalic and atraumatic.  Mouth/Throat: Uvula is midline and oropharynx is clear and moist. No oral lesions. Abnormal dentition. Dental caries present. No dental abscesses or uvula swelling. No oropharyngeal exudate, posterior oropharyngeal edema, posterior oropharyngeal erythema or tonsillar abscesses.    No evidence of Ludwig's angina.  Eyes: Conjunctivae are normal. Right eye exhibits no discharge. Left eye exhibits no discharge. No scleral icterus.  Neck: Neck supple.  Cardiovascular: Normal rate.   Pulmonary/Chest: Effort normal.  Lymphadenopathy:    She has no cervical adenopathy.  Neurological: She is alert and oriented to person, place, and time. Coordination normal.  Skin: Skin is warm and dry. No rash noted. She is not diaphoretic. No erythema. No pallor.  Psychiatric: She has a normal mood and affect. Her behavior is normal.  Nursing note and vitals reviewed.   ED Course  Procedures (including critical care time) Labs Review Labs Reviewed - No data to display  Imaging Review No results found. I have personally reviewed and evaluated these images and lab results as part of my medical decision-making.   EKG Interpretation None      MDM   Final diagnoses:  Pain due to dental caries    29 year old. Presents for four-day onset of right back molar dental pain. Patient has significant dental caries throughout her teeth. On  exam there is no evidence of dental abscess or Ludwig's angina. No fever. Vital signs stable. Patient will need tooth extraction in the future. Will place on antibiotics and give referral to oral surgery. We'll also give pain medication. Patient stable for discharge. Return precautions outlined in patient discharge instructions.    Dondra Spry [place], PA-C 04/14/15 [place], MD 04/14/15 [phone]

## 2015-04-14 NOTE — ED Notes (Signed)
PT reports dental pain on uper RFT side.

## 2015-04-14 NOTE — Discharge Instructions (Signed)
Dental Caries Dental caries is tooth decay. This decay can cause a hole in teeth (cavity) that can get bigger and deeper over time. HOME CARE  Brush and floss your teeth. Do this at least two times a day.  Use a fluoride toothpaste.  Use a mouth rinse if told by your dentist or doctor.  Eat less sugary and starchy foods. Drink less sugary drinks.  Avoid snacking often on sugary and starchy foods. Avoid sipping often on sugary drinks.  Keep regular checkups and cleanings with your dentist.  Use fluoride supplements if told by your dentist or doctor.  Allow fluoride to be applied to teeth if told by your dentist or doctor.   This information is not intended to replace advice given to you by your health care provider. Make sure you discuss any questions you have with your health care provider.   Document Released: 02/20/2008 Document Revised: 06/03/2014 Document Reviewed: 05/15/2012 Elsevier Interactive Patient Education 2016 [place] Extraction, Care After Refer to this sheet in the next few weeks. These instructions provide you with information about caring for yourself after your procedure. Your health care provider may also give you more specific instructions. Your treatment has been planned according to current medical practices, but problems sometimes occur. Call your health care provider if you have any problems or questions after your procedure. WHAT TO EXPECT AFTER THE PROCEDURE After your procedure, it is common to have:   Pain and swelling for a few days.  Numbness for a few hours after the procedure. HOME CARE INSTRUCTIONS Lifestyle  Protect the area where your tooth was extracted, even if there is no pain.  Do not smoke, do not spit, and do not drink through a straw until your health care provider says it is okay.  Eat soft-textured foodsas directed by your health care provider. Avoid hot drinks and spicy foods until your gum has healed. Incision  Care  Follow instructions from your health care provider about:  Incision care.  Gauze changes and removal.  Incision closure removal.  Do not chew on the gauze.  If you have heavy bleeding from your gum, fold a clean piece of gauze, place it on the bleeding gum, and bite on it as directed by your health care provider. General Instructions  Take medicines only as directed by your health care provider.  Do not rinse your mouth until your health care provider says it is okay. Once you are told that you can rinse your mouth, do not rinse with a lot of force (vigorously). Doing that can break up the needed clot that forms where your tooth was extracted.  You may rinse your mouth with warm salt water after your health care provider says it is okay. You can make a salt rinse by mixing one teaspoon of salt in two cups of warm water. Do this as directed by your health care provider.  Do not brush or floss near the tooth socket where your tooth was extracted until your health care provider says it is okay. You may brush your other teeth.  If directed, apply ice to your cheek on the affected side of your mouth:  Put ice in a plastic bag.  Place a towel between your skin and the bag.  Leave the ice on for 20 minutes, 2-3 times a day.  Keep all follow-up visits as directed by your health care provider. This is important. SEEK MEDICAL CARE IF:  Your pain is not controlled with  medicines.  You have a fever with nausea, vomiting, or chills.  You have a severe cough or shortness of breath. SEEK IMMEDIATE MEDICAL CARE IF:  You have uncontrolled bleeding, increased swelling, or severe pain.  You have fluid, blood, or pus coming from the gum where your tooth was extracted.  You have difficulty swallowing.  You cannot open your mouth.   This information is not intended to replace advice given to you by your health care provider. Make sure you discuss any questions you have with your  health care provider.   Document Released: 08/28/2010 Document Revised: 09/27/2014 Document Reviewed: 05/09/2014 Elsevier Interactive Patient Education 2016 [place] Pain Dental pain may be caused by many things, including:  Tooth decay (cavities or caries). Cavities expose the nerve of your tooth to air and hot or cold temperatures. This can cause pain or discomfort.  Abscess or infection. A dental abscess is a collection of infected pus from a bacterial infection in the inner part of the tooth (pulp). It usually occurs at the end of the tooth's root.  Injury.  An unknown reason (idiopathic). Your pain may be mild or severe. It may only occur when:  You are chewing.  You are exposed to hot or cold temperature.  You are eating or drinking sugary foods or beverages, such as soda or candy. Your pain may also be constant. HOME CARE INSTRUCTIONS Watch your dental pain for any changes. The following actions may help to lessen any discomfort that you are feeling:  Take medicines only as directed by your dentist.  If you were prescribed an antibiotic medicine, finish all of it even if you start to feel better.  Keep all follow-up visits as directed by your dentist. This is important.  Do not apply heat to the outside of your face.  Rinse your mouth or gargle with salt water if directed by your dentist. This helps with pain and swelling.  You can make salt water by adding  tsp of salt to 1 cup of warm water.  Apply ice to the painful area of your face:  Put ice in a plastic bag.  Place a towel between your skin and the bag.  Leave the ice on for 20 minutes, 2-3 times per day.  Avoid foods or drinks that cause you pain, such as:  Very hot or very cold foods or drinks.  Sweet or sugary foods or drinks. SEEK MEDICAL CARE IF:  Your pain is not controlled with medicines.  Your symptoms are worse.  You have new symptoms. SEEK IMMEDIATE MEDICAL CARE  IF:  You are unable to open your mouth.  You are having trouble breathing or swallowing.  You have a fever.  Your face, neck, or jaw is swollen.   This information is not intended to replace advice given to you by your health care provider. Make sure you discuss any questions you have with your health care provider.   Document Released: 05/13/2005 Document Revised: 09/27/2014 Document Reviewed: 05/09/2014 Elsevier Interactive Patient Education 2016 [place], Adult Preventive dental care includes seeing a dentist regularly and practicing good dental care (oral hygiene) at home. These actions can help to prevent cavities and other tooth problems, root canal problems, gum disease (gingivitis), and tooth loss. Regular dental exams may also help your health care provider to diagnose other medical problems. Many diseases, including mouth cancers, have early signs that can be found during a preventive dental care visit. WHAT  CAN I EXPECT DURING MY DENTAL VISITS? Many adults see their dentist one or two times each year for oral exams and cleanings. Talk with your dentist about the best preventive dental care schedule for you. At your visit, your dentist may ask you about:  Your overall health and diet.  Any new symptoms, such as:  Bleeding gums.  Mouth, tooth, or jaw pain. Your dentist will do a mouth (oral) exam to check for:  Cavities.  Gingivitis or other problems.  Signs of cancer.  Neck swelling or lumps.  Abnormal jaw movement or pain in the jaw joint. You may also have:  Dental X-rays.  Your teeth cleaned. If you have an early problem, like a cavity, your dentist will schedule time for you to get treatment. If you have a tooth root problem, gum disease, or a sign of another disease, your dentist may send you to see another health care provider for care. HOW CAN I CARE FOR MY TEETH AT HOME?  Brush with an approved fluoride toothpaste every  morning and night. If possible, brush within 10 minutes after every meal.  Floss one time every day.  Periodically check your teeth for white or brown spots after brushing. These may be signs of cavities.  Check your gums for swelling or bleeding. These may be signs of gum disease, such as gingivitis or periodontitis.  Make sure your diet includes plenty of fruits, vegetables, milk and dairy products, whole grains, and proteins. Do not eat a lot of starchy foods or foods with added sugar. Talk with your health care provider if you have questions about following a healthy diet.  Avoid sodas, sugary snacks, and sticky candies.  Do not smoke.  Do not get mouth piercings.  If you have tooth or gum pain, gargle with a salt-water mixture 3-4 times per day or as needed. To make a salt-water mixture, completely dissolve -1 tsp of salt in 1 cup of warm water.  Take over-the counter and prescription medicines only as told by your dentist.  If you have a permanent tooth knocked out:  Find the tooth.  Pick it up by the top (crown) with a tissue or gauze.  Wash the tooth for no more than 10 seconds under cold, running water.  Try to put the tooth back into the gum socket.  Put the tooth in a glass of milk if you cannot get it back in place.  Go to your dentist right away. Take the tooth with you. WHEN SHOULD I SEEK MEDICAL CARE? Call your dentist if you have:  Gum, tooth, or jaw pain.  Red, swollen, or bleeding gums.  A tooth or teeth that are very sensitive to hot or cold.  Very bad breath.  A problem with a filling, crown, implant, or denture.  A broken or loose tooth.  A growth or sore in your mouth that is not going away. FOR MORE INFORMATION American Dental Association: http://clayton-rivera.info/    This information is not intended to replace advice given to you by your health care provider. Make sure you discuss any questions you have with your health care provider.    Follow-up  with dentist as soon as possible to schedule tooth extraction. Take antibiotics as prescribed. Return to the emergency department if you experience worsening of her symptoms, fever, facial swelling, difficulty swallowing or breathing.

## 2017-02-04 ENCOUNTER — Ambulatory Visit (INDEPENDENT_AMBULATORY_CARE_PROVIDER_SITE_OTHER): Payer: Self-pay

## 2017-02-04 VITALS — BP 151/73 | HR 72 | Wt 171.0 lb

## 2017-02-04 DIAGNOSIS — Z202 Contact with and (suspected) exposure to infections with a predominantly sexual mode of transmission: Secondary | ICD-10-CM

## 2017-02-04 DIAGNOSIS — Z113 Encounter for screening for infections with a predominantly sexual mode of transmission: Secondary | ICD-10-CM

## 2017-02-04 DIAGNOSIS — N898 Other specified noninflammatory disorders of vagina: Secondary | ICD-10-CM

## 2017-02-05 LAB — HIV ANTIBODY (ROUTINE TESTING W REFLEX): HIV Screen 4th Generation wRfx: NONREACTIVE

## 2017-02-06 LAB — CERVICOVAGINAL ANCILLARY ONLY
Bacterial vaginitis: POSITIVE — AB
CANDIDA VAGINITIS: NEGATIVE
Chlamydia: NEGATIVE
Neisseria Gonorrhea: NEGATIVE
Trichomonas: NEGATIVE

## 2017-02-11 ENCOUNTER — Other Ambulatory Visit: Payer: Self-pay

## 2017-02-11 ENCOUNTER — Telehealth: Payer: Self-pay | Admitting: *Deleted

## 2017-02-11 MED ORDER — METRONIDAZOLE 500 MG PO TABS: 500.0000 mg | ORAL_TABLET | Freq: Two times a day (BID) | ORAL | 0 refills | Status: DC

## 2017-02-11 NOTE — Telephone Encounter (Signed)
Pt calling requesting her test results.

## 2017-02-11 NOTE — Telephone Encounter (Signed)
Called patient to discuss test results. Patient stated that she had already received a call from the office. Patient stated that she is aware of Flagyl being sent to her pharmacy.

## 2017-02-11 NOTE — Progress Notes (Signed)
Patient came into to office for std check. Patient stated she has been having some vaginal discharge and wanted to be check. Patient informed on how to do self swab. Advised patient results will take 2-3 days for results to come back.

## 2018-04-04 ENCOUNTER — Ambulatory Visit (HOSPITAL_COMMUNITY)
Admission: EM | Admit: 2018-04-04 | Discharge: 2018-04-04 | Disposition: A | Discharge status: Home / Self Care | Payer: Self-pay | Attending: Internal Medicine | Admitting: Internal Medicine

## 2018-04-04 ENCOUNTER — Encounter (HOSPITAL_COMMUNITY): Payer: Self-pay | Admitting: *Deleted

## 2018-04-04 ENCOUNTER — Other Ambulatory Visit: Payer: Self-pay

## 2018-04-04 DIAGNOSIS — R102 Pelvic and perineal pain: Secondary | ICD-10-CM

## 2018-04-04 DIAGNOSIS — F17210 Nicotine dependence, cigarettes, uncomplicated: Secondary | ICD-10-CM | POA: Insufficient documentation

## 2018-04-04 DIAGNOSIS — B9689 Other specified bacterial agents as the cause of diseases classified elsewhere: Secondary | ICD-10-CM | POA: Insufficient documentation

## 2018-04-04 DIAGNOSIS — Z8249 Family history of ischemic heart disease and other diseases of the circulatory system: Secondary | ICD-10-CM | POA: Insufficient documentation

## 2018-04-04 DIAGNOSIS — N760 Acute vaginitis: Secondary | ICD-10-CM | POA: Insufficient documentation

## 2018-04-04 DIAGNOSIS — Z3202 Encounter for pregnancy test, result negative: Secondary | ICD-10-CM

## 2018-04-04 DIAGNOSIS — I10 Essential (primary) hypertension: Secondary | ICD-10-CM | POA: Insufficient documentation

## 2018-04-04 DIAGNOSIS — Z113 Encounter for screening for infections with a predominantly sexual mode of transmission: Secondary | ICD-10-CM | POA: Insufficient documentation

## 2018-04-04 DIAGNOSIS — R109 Unspecified abdominal pain: Secondary | ICD-10-CM | POA: Insufficient documentation

## 2018-04-04 DIAGNOSIS — N898 Other specified noninflammatory disorders of vagina: Secondary | ICD-10-CM

## 2018-04-04 LAB — POCT URINALYSIS DIP (DEVICE)
Bilirubin Urine: NEGATIVE
GLUCOSE, UA: NEGATIVE mg/dL
Hgb urine dipstick: NEGATIVE
Ketones, ur: NEGATIVE mg/dL
LEUKOCYTES UA: NEGATIVE
NITRITE: NEGATIVE
Protein, ur: NEGATIVE mg/dL
Specific Gravity, Urine: 1.025 (ref 1.005–1.030)
UROBILINOGEN UA: 1 mg/dL (ref 0.0–1.0)
pH: 7 (ref 5.0–8.0)

## 2018-04-04 LAB — RAPID HIV SCREEN (HIV 1/2 AB+AG)
HIV 1/2 ANTIBODIES: NONREACTIVE
HIV-1 P24 ANTIGEN - HIV24: NONREACTIVE

## 2018-04-04 LAB — POCT PREGNANCY, URINE: Preg Test, Ur: NEGATIVE

## 2018-04-04 MED ORDER — AZITHROMYCIN 250 MG PO TABS
1000.0000 mg | ORAL_TABLET | Freq: Once | ORAL | Status: AC
  Administered 2018-04-04: 1000 mg via ORAL

## 2018-04-04 MED ORDER — AZITHROMYCIN 250 MG PO TABS
ORAL_TABLET | ORAL | Status: AC
  Filled 2018-04-04: qty 4

## 2018-04-04 MED ORDER — CEFTRIAXONE SODIUM 250 MG IJ SOLR
INTRAMUSCULAR | Status: AC
  Filled 2018-04-04: qty 250

## 2018-04-04 MED ORDER — METRONIDAZOLE 500 MG PO TABS: 500.0000 mg | ORAL_TABLET | Freq: Two times a day (BID) | ORAL | 0 refills | Status: DC

## 2018-04-04 MED ORDER — CEFTRIAXONE SODIUM 250 MG IJ SOLR
250.0000 mg | Freq: Once | INTRAMUSCULAR | Status: AC
  Administered 2018-04-04: 250 mg via INTRAMUSCULAR

## 2018-04-04 NOTE — Discharge Instructions (Signed)
I treated you for possible pelvic infection to cover gonorrhea and chalmydia here at the clinic. I've sent Flagyl to cover for bacterial vaginitis. Your swabs should be back Monday or Tuesday and we will call you with those results.

## 2018-04-04 NOTE — ED Provider Notes (Signed)
[facility]    CSN: [id number] Arrival date & time: 04/04/18  1053     History   Chief Complaint Chief Complaint  Patient presents with  . Abdominal Pain  . Vaginal Discharge    HPI Ann Bruce is a 32 y.o. female.   Onset of vaginal discharge with odor x 4 days. Denies itching. Has also been noticing suprapubic discomfort when she voids but improves when she voids. She is sexually active and she has been with same current partner who is her husband x 10 yeas. Denies sex with someone else. Her husband has given her 2 STD's in the past, so she would like full STI testing. Denies fever, chills or sweats.      Past Medical History:  Diagnosis Date  . Hypertension   . Ovarian cyst   . UTI (urinary tract infection)     There are no active problems to display for this patient.   Past Surgical History:  Procedure Laterality Date  . APPENDECTOMY    . OVARIAN CYST SURGERY  2009  . VAGINAL DELIVERY  2009    OB History   None      Home Medications    Prior to Admission medications   Medication Sig Start Date End Date Taking? Authorizing Provider  metroNIDAZOLE (FLAGYL) 500 MG tablet Take 1 tablet (500 mg total) by mouth 2 (two) times daily. 04/04/18   Rodriguez-Southworth, Sunday Spillers, PA-C    Family History Family History  Problem Relation Age of Onset  . Hypertension Mother   . Hypertension Father     Social History Social History   Tobacco Use  . Smoking status: Current Every Day Smoker    Packs/day: 0.50  . Smokeless tobacco: Never Used  Substance Use Topics  . Alcohol use: Yes    Comment: occasional  . Drug use: Never     Allergies   Patient has no known allergies.   Review of Systems Review of Systems  Constitutional: Negative for chills, diaphoresis and fever.  Gastrointestinal: Positive for abdominal pain. Negative for abdominal distention, blood in stool, constipation, diarrhea, nausea and vomiting.       Suprapubic pain    Genitourinary: Positive for pelvic pain and vaginal discharge. Negative for decreased urine volume, difficulty urinating, dysuria, flank pain, frequency, genital sores, hematuria, urgency, vaginal bleeding and vaginal pain.  Musculoskeletal: Negative for gait problem.  Skin: Negative for rash and wound.     Physical Exam Triage Vital Signs ED Triage Vitals  Enc Vitals Group     BP 04/04/18 1146 (!) 155/87     Pulse Rate 04/04/18 1146 82     Resp 04/04/18 1146 14     Temp 04/04/18 1146 98.2 F (36.8 C)     Temp Source 04/04/18 1146 Oral     SpO2 04/04/18 1146 100 %     Weight --      Height --      Head Circumference --      Peak Flow --      Pain Score 04/04/18 1147 5     Pain Loc --      Pain Edu? --      Excl. in [place]? --    No data found.  Updated Vital Signs BP (!) 155/87   Pulse 82   Temp 98.2 F (36.8 C) (Oral)   Resp 14   LMP 03/18/2018 (Exact Date)   SpO2 100%   Visual Acuity Right Eye Distance:   Left  Eye Distance:   Bilateral Distance:    Right Eye Near:   Left Eye Near:    Bilateral Near:     Physical Exam  Constitutional: She is oriented to person, place, and time. She appears well-developed and well-nourished.  Non-toxic appearance. She does not appear ill. No distress.  HENT:  Head: Normocephalic.  Eyes: No scleral icterus.  Pulmonary/Chest: Effort normal.  Abdominal: Soft. Normal appearance. She exhibits no mass. Bowel sounds are increased. There is no hepatosplenomegaly or splenomegaly. There is tenderness. No hernia.  Tender over suprapubic region  Genitourinary: Uterus is tender. Uterus is not deviated, not enlarged and not fixed.  Genitourinary Comments: External genitalia is norma. MA had pt do a self swab before I did the pelvic, so no speculum exam done.  Bimanual exam- Neg chandelier sign, but uterus is tender with cervical movement and palpation of uterus. No adnexal tenderness or fullness noted.   Neurological: She is alert and  oriented to person, place, and time.  Skin: Skin is warm and dry. No rash noted.  Psychiatric: She has a normal mood and affect. Her behavior is normal.  Nursing note and vitals reviewed.    UC Treatments / Results  Labs (all labs ordered are listed, but only abnormal results are displayed) Labs Reviewed  HIV ANTIBODY (ROUTINE TESTING W REFLEX)  RAPID HIV SCREEN (HIV 1/2 AB+AG)  POCT URINALYSIS DIP (DEVICE)  POCT PREGNANCY, URINE  CERVICOVAGINAL ANCILLARY ONLY   UPT- neg UA- neg)  Medications Ordered in UC Medications  cefTRIAXone (ROCEPHIN) injection 250 mg (has no administration in time range)  azithromycin (ZITHROMAX) tablet 1,000 mg (has no administration in time range)    Initial Impression / Assessment and Plan / UC Course  I have reviewed the triage vital signs and the nursing notes. She may have BV, but due to uterine pain I treated for for GC and Chlamydia here. I also placed her on Flagyl to cover BV and other causes of intrauterine infections. We will call her with results when they are back.     Final Clinical Impressions(s) / UC Diagnoses   Final diagnoses:  Screen for STD (sexually transmitted disease)  Vaginal discharge  BV (bacterial vaginosis)  Acute suprapubic pain     Discharge Instructions     I treated you for possible pelvic infection to cover gonorrhea and chalmydia here at the clinic. I've sent Flagyl to cover for bacterial vaginitis. Your swabs should be back Monday or Tuesday and we will call you with those results.     ED Prescriptions    Medication Sig Dispense Auth. Provider   metroNIDAZOLE (FLAGYL) 500 MG tablet Take 1 tablet (500 mg total) by mouth 2 (two) times daily. 14 tablet Rodriguez-Southworth, Sunday Spillers, PA-C     Controlled Substance Prescriptions [place] Controlled Substance Registry consulted?    Shelby Mattocks, PA-C 04/04/18 1614

## 2018-04-04 NOTE — ED Triage Notes (Signed)
C/O suprapubic pain with vaginal discharge x 4-5 days without fever.

## 2018-04-05 LAB — HIV ANTIBODY (ROUTINE TESTING W REFLEX): HIV SCREEN 4TH GENERATION: NONREACTIVE

## 2018-04-06 LAB — CERVICOVAGINAL ANCILLARY ONLY
Bacterial vaginitis: NEGATIVE
CHLAMYDIA, DNA PROBE: NEGATIVE
Candida vaginitis: NEGATIVE
Neisseria Gonorrhea: NEGATIVE
Trichomonas: NEGATIVE

## 2018-11-26 ENCOUNTER — Encounter (HOSPITAL_COMMUNITY): Payer: Self-pay

## 2018-11-26 ENCOUNTER — Other Ambulatory Visit: Payer: Self-pay

## 2018-11-26 ENCOUNTER — Ambulatory Visit (HOSPITAL_COMMUNITY)
Admission: EM | Admit: 2018-11-26 | Discharge: 2018-11-26 | Disposition: A | Discharge status: Home / Self Care | Payer: Self-pay | Attending: Family Medicine | Admitting: Family Medicine

## 2018-11-26 DIAGNOSIS — I10 Essential (primary) hypertension: Secondary | ICD-10-CM | POA: Insufficient documentation

## 2018-11-26 DIAGNOSIS — N760 Acute vaginitis: Secondary | ICD-10-CM | POA: Insufficient documentation

## 2018-11-26 DIAGNOSIS — B9689 Other specified bacterial agents as the cause of diseases classified elsewhere: Secondary | ICD-10-CM | POA: Insufficient documentation

## 2018-11-26 MED ORDER — FLUCONAZOLE 150 MG PO TABS: 150.0000 mg | ORAL_TABLET | Freq: Every day | ORAL | 0 refills | Status: DC

## 2018-11-26 MED ORDER — TRIAMTERENE-HCTZ 37.5-25 MG PO TABS: 1.0000 | ORAL_TABLET | Freq: Every day | ORAL | 1 refills | Status: DC

## 2018-11-26 MED ORDER — METRONIDAZOLE 500 MG PO TABS: 500.0000 mg | ORAL_TABLET | Freq: Two times a day (BID) | ORAL | 0 refills | Status: DC

## 2018-11-26 NOTE — ED Triage Notes (Signed)
Patient presents to Urgent Care with complaints of odorous vaginal discharge since 3 days ago. Patient reports she has had BV in the past and thinks that is what she has.

## 2018-11-26 NOTE — ED Provider Notes (Signed)
[facility]    CSN: [id number] Arrival date & time: 11/26/18  4332      History   Chief Complaint Chief Complaint  Patient presents with  . Vaginal Discharge    HPI Ann Bruce is a 33 y.o. female.   HPI  Patient has vaginal discharge with mild discomfort and an odor for 3 days.  She has had BV in the past.  She thinks this is what it is.  She does not think that she has been exposed to sexually transmitted diseases.  She agrees to testing. I discussed with her that her blood pressure is quite high.  She has been diagnosed with hypertension in the past.  She been treated with medication in the past and previously took hydrochlorothiazide 12.5 a day.  She ran out, and thought that if she dieted and exercised and did not eat salt that her blood pressure would come back down.  She is neglected to follow her blood pressure.  She does not currently have a primary care doctor.  She denies any headaches, dizzy spells, nausea, visual symptoms  Past Medical History:  Diagnosis Date  . Hypertension   . Ovarian cyst   . UTI (urinary tract infection)     There are no active problems to display for this patient.   Past Surgical History:  Procedure Laterality Date  . APPENDECTOMY    . OVARIAN CYST SURGERY  2009  . VAGINAL DELIVERY  2009    OB History   No obstetric history on file.      Home Medications    Prior to Admission medications   Medication Sig Start Date End Date Taking? Authorizing Provider  fluconazole (DIFLUCAN) 150 MG tablet Take 1 tablet (150 mg total) by mouth daily. Repeat in 1 week if needed 11/26/18   Raylene Everts, MD  metroNIDAZOLE (FLAGYL) 500 MG tablet Take 1 tablet (500 mg total) by mouth 2 (two) times daily. 11/26/18   Raylene Everts, MD  triamterene-hydrochlorothiazide (MAXZIDE-25) 37.5-25 MG tablet Take 1 tablet by mouth daily. 11/26/18   Raylene Everts, MD    Family History Family History  Problem Relation Age of Onset  .  Hypertension Mother   . Hypertension Father     Social History Social History   Tobacco Use  . Smoking status: Current Every Day Smoker    Packs/day: 0.50  . Smokeless tobacco: Never Used  Substance Use Topics  . Alcohol use: Yes    Comment: occasional  . Drug use: Never     Allergies   Patient has no known allergies.   Review of Systems Review of Systems  Constitutional: Negative for chills and fever.  HENT: Negative for ear pain and sore throat.   Eyes: Negative for pain and visual disturbance.  Respiratory: Negative for cough and shortness of breath.   Cardiovascular: Negative for chest pain and palpitations.  Gastrointestinal: Negative for abdominal pain and vomiting.  Genitourinary: Positive for vaginal discharge. Negative for dysuria and hematuria.  Musculoskeletal: Negative for arthralgias and back pain.  Skin: Negative for color change and rash.  Neurological: Negative for seizures and syncope.  All other systems reviewed and are negative.    Physical Exam Triage Vital Signs ED Triage Vitals  Enc Vitals Group     BP 11/26/18 1030 (!) 160/114     Pulse Rate 11/26/18 1030 71     Resp 11/26/18 1030 17     Temp 11/26/18 1030 99 F (37.2 C)  Temp Source 11/26/18 1030 Oral     SpO2 11/26/18 1030 98 %     Weight --      Height --      Head Circumference --      Peak Flow --      Pain Score 11/26/18 1029 0     Pain Loc --      Pain Edu? --      Excl. in [place]? --    No data found.  Updated Vital Signs BP (!) 160/114 (BP Location: Left Arm)   Pulse 71   Temp 99 F (37.2 C) (Oral)   Resp 17   LMP 11/10/2018 (Exact Date)   SpO2 98%      Physical Exam Constitutional:      General: She is not in acute distress.    Appearance: She is well-developed and normal weight.  HENT:     Head: Normocephalic and atraumatic.     Mouth/Throat:     Mouth: Mucous membranes are moist.  Eyes:     Conjunctiva/sclera: Conjunctivae normal.     Pupils: Pupils are  equal, round, and reactive to light.  Neck:     Musculoskeletal: Normal range of motion.  Cardiovascular:     Rate and Rhythm: Normal rate and regular rhythm.     Pulses: Normal pulses.     Heart sounds: Normal heart sounds.  Pulmonary:     Effort: Pulmonary effort is normal. No respiratory distress.     Breath sounds: Normal breath sounds.  Abdominal:     General: Abdomen is flat. There is no distension.     Palpations: Abdomen is soft.     Tenderness: There is no abdominal tenderness.  Musculoskeletal: Normal range of motion.     Right lower leg: No edema.     Left lower leg: No edema.  Skin:    General: Skin is warm and dry.  Neurological:     Mental Status: She is alert.      UC Treatments / Results  Labs (all labs ordered are listed, but only abnormal results are displayed) Labs Reviewed  RPR  HIV ANTIBODY (ROUTINE TESTING W REFLEX)  CERVICOVAGINAL ANCILLARY ONLY    EKG   Radiology No results found.  Procedures Procedures (including critical care time)  Medications Ordered in UC Medications - No data to display  Initial Impression / Assessment and Plan / UC Course  I have reviewed the triage vital signs and the nursing notes.  Pertinent labs & imaging results that were available during my care of the patient were reviewed by me and considered in my medical decision making (see chart for details).     I reviewed with patient that she has untreated hypertension.  This is placing her at increased risk for complications such as kidney disease, heart attack, stroke.  She agrees to go back on her blood pressure medication.  She agrees to call for a primary care follow-up. Also going to treat her BV and tested for STDs per her request Final Clinical Impressions(s) / UC Diagnoses   Final diagnoses:  BV (bacterial vaginosis)  Essential hypertension     Discharge Instructions     Take the metronidazole 2 times a day for 7 days.  This treats bacterial  vaginosis Take the Diflucan 1 now and then 1 again at the end of your antibiotics.  This is for prevention of yeast infections Take your blood pressure pill once a day every day.  Follow-up with a  primary care doctor.   ED Prescriptions    Medication Sig Dispense Auth. Provider   triamterene-hydrochlorothiazide (MAXZIDE-25) 37.5-25 MG tablet Take 1 tablet by mouth daily. 30 tablet Raylene Everts, MD   metroNIDAZOLE (FLAGYL) 500 MG tablet Take 1 tablet (500 mg total) by mouth 2 (two) times daily. 14 tablet Raylene Everts, MD   fluconazole (DIFLUCAN) 150 MG tablet Take 1 tablet (150 mg total) by mouth daily. Repeat in 1 week if needed 2 tablet Raylene Everts, MD     Controlled Substance Prescriptions [place] Controlled Substance Registry consulted? Not Applicable   Raylene Everts, MD 11/26/18 2050

## 2018-11-26 NOTE — Discharge Instructions (Addendum)
Take the metronidazole 2 times a day for 7 days.  This treats bacterial vaginosis Take the Diflucan 1 now and then 1 again at the end of your antibiotics.  This is for prevention of yeast infections Take your blood pressure pill once a day every day.  Follow-up with a primary care doctor.

## 2018-11-27 LAB — RPR: RPR Ser Ql: NONREACTIVE

## 2018-11-30 LAB — CERVICOVAGINAL ANCILLARY ONLY
Chlamydia: NEGATIVE
Neisseria Gonorrhea: NEGATIVE
Trichomonas: NEGATIVE

## 2018-12-01 LAB — HIV ANTIBODY (ROUTINE TESTING W REFLEX): HIV Screen 4th Generation wRfx: NONREACTIVE

## 2018-12-18 ENCOUNTER — Other Ambulatory Visit: Payer: Self-pay

## 2018-12-18 ENCOUNTER — Ambulatory Visit: Payer: Self-pay | Admitting: Obstetrics & Gynecology

## 2018-12-18 ENCOUNTER — Encounter: Payer: Self-pay | Admitting: Obstetrics & Gynecology

## 2018-12-18 VITALS — BP 127/91 | HR 82 | Temp 98.6°F | Ht 67.0 in | Wt 174.5 lb

## 2018-12-18 NOTE — Progress Notes (Signed)
Patient unable to be seen due to insurance concerns.     [name], MD

## 2019-01-16 ENCOUNTER — Ambulatory Visit (HOSPITAL_COMMUNITY)
Admission: EM | Admit: 2019-01-16 | Discharge: 2019-01-16 | Disposition: A | Discharge status: Home / Self Care | Payer: BLUE CROSS/BLUE SHIELD | Attending: Family Medicine | Admitting: Family Medicine

## 2019-01-16 ENCOUNTER — Encounter (HOSPITAL_COMMUNITY): Payer: Self-pay | Admitting: *Deleted

## 2019-01-16 DIAGNOSIS — Z3202 Encounter for pregnancy test, result negative: Secondary | ICD-10-CM

## 2019-01-16 DIAGNOSIS — Z202 Contact with and (suspected) exposure to infections with a predominantly sexual mode of transmission: Secondary | ICD-10-CM

## 2019-01-16 DIAGNOSIS — Z7689 Persons encountering health services in other specified circumstances: Secondary | ICD-10-CM | POA: Insufficient documentation

## 2019-01-16 DIAGNOSIS — I10 Essential (primary) hypertension: Secondary | ICD-10-CM

## 2019-01-16 LAB — POCT URINALYSIS DIP (DEVICE)
Bilirubin Urine: NEGATIVE
Glucose, UA: NEGATIVE mg/dL
Hgb urine dipstick: NEGATIVE
Ketones, ur: NEGATIVE mg/dL
Leukocytes,Ua: NEGATIVE
Nitrite: NEGATIVE
Protein, ur: NEGATIVE mg/dL
Specific Gravity, Urine: 1.025 (ref 1.005–1.030)
Urobilinogen, UA: 1 mg/dL (ref 0.0–1.0)
pH: 6.5 (ref 5.0–8.0)

## 2019-01-16 LAB — POCT PREGNANCY, URINE: Preg Test, Ur: NEGATIVE

## 2019-01-16 MED ORDER — CEFTRIAXONE SODIUM 250 MG IJ SOLR
INTRAMUSCULAR | Status: AC
  Filled 2019-01-16: qty 250

## 2019-01-16 MED ORDER — AZITHROMYCIN 250 MG PO TABS
1000.0000 mg | ORAL_TABLET | Freq: Once | ORAL | Status: AC
  Administered 2019-01-16: 1000 mg via ORAL

## 2019-01-16 MED ORDER — CEFTRIAXONE SODIUM 250 MG IJ SOLR
250.0000 mg | Freq: Once | INTRAMUSCULAR | Status: AC
  Administered 2019-01-16: 250 mg via INTRAMUSCULAR

## 2019-01-16 MED ORDER — AZITHROMYCIN 250 MG PO TABS
ORAL_TABLET | ORAL | Status: AC
  Filled 2019-01-16: qty 4

## 2019-01-16 NOTE — Discharge Instructions (Addendum)
Attached your labs from recent visit 11/26/18.

## 2019-01-16 NOTE — ED Triage Notes (Signed)
Patient reports exposure to chlamydia. Reports mild discharge. Would like testing and treatment today.

## 2019-01-16 NOTE — ED Provider Notes (Signed)
[place]    CSN: [id number] Arrival date & time: 01/16/19  1334      History   Chief Complaint Chief Complaint  Patient presents with  . SEXUALLY TRANSMITTED DISEASE    HPI Ann Bruce is a 33 y.o. female.   HPI  Sexually Transmitted Disease Check: Patient presents for sexually transmitted disease check. Sexual history reviewed with the patient. STD exposure: current sexual partner , husband, tested positive for chlamydia 6 days ago.  Current symptoms include discolored discharge. Contraception: none. Patient's last menstrual period was 01/04/2019. Past Medical History:  Diagnosis Date  . Hypertension   . Ovarian cyst   . UTI (urinary tract infection)     There are no active problems to display for this patient.   Past Surgical History:  Procedure Laterality Date  . APPENDECTOMY    . OVARIAN CYST SURGERY  2009  . VAGINAL DELIVERY  2009    OB History   No obstetric history on file.      Home Medications    Prior to Admission medications   Medication Sig Start Date End Date Taking? Authorizing Provider  triamterene-hydrochlorothiazide (MAXZIDE-25) 37.5-25 MG tablet Take 1 tablet by mouth daily. 11/26/18  Yes Raylene Everts, MD  fluconazole (DIFLUCAN) 150 MG tablet Take 1 tablet (150 mg total) by mouth daily. Repeat in 1 week if needed Patient not taking: Reported on 12/18/2018 11/26/18   Raylene Everts, MD  metroNIDAZOLE (FLAGYL) 500 MG tablet Take 1 tablet (500 mg total) by mouth 2 (two) times daily. 11/26/18   Raylene Everts, MD    Family History Family History  Problem Relation Age of Onset  . Hypertension Mother   . Hypertension Father     Social History Social History   Tobacco Use  . Smoking status: Current Every Day Smoker    Packs/day: 0.50  . Smokeless tobacco: Never Used  Substance Use Topics  . Alcohol use: Yes    Comment: occasional  . Drug use: Never     Allergies   Patient has no known allergies.   Review  of Systems Review of Systems Pertinent negatives listed in HPI Physical Exam Triage Vital Signs ED Triage Vitals  Enc Vitals Group     BP 01/16/19 1349 (!) 141/105     Pulse Rate 01/16/19 1349 99     Resp 01/16/19 1349 16     Temp 01/16/19 1349 98.4 F (36.9 C)     Temp src --      SpO2 01/16/19 1349 98 %     Weight --      Height --      Head Circumference --      Peak Flow --      Pain Score 01/16/19 1404 0     Pain Loc --      Pain Edu? --      Excl. in [place]? --    No data found.  Updated Vital Signs BP (!) 141/105 (BP Location: Left Arm)   Pulse 99   Temp 98.4 F (36.9 C)   Resp 16   LMP 01/04/2019   SpO2 98%   Visual Acuity Right Eye Distance:   Left Eye Distance:   Bilateral Distance:    Right Eye Near:   Left Eye Near:    Bilateral Near:     Physical Exam General appearance: alert, well developed, well nourished, cooperative and in no distress Head: Normocephalic, without obvious abnormality, atraumatic Respiratory: Respirations even  and unlabored, normal respiratory rate Heart: rate and rhythm normal. No gallop or murmurs noted on exam  Abdomen: BS +, no distention, no rebound tenderness, or no mass Extremities: No gross deformities Skin: Skin color, texture, turgor normal. No rashes seen  Psych: Appropriate mood and affect. Neurologic: Mental status: Alert, oriented to person, place, and time, thought content appropriate.  UC Treatments / Results  Labs (all labs ordered are listed, but only abnormal results are displayed) Labs Reviewed  POC URINE PREG, ED  CERVICOVAGINAL ANCILLARY ONLY    EKG   Radiology No results found.  Procedures Procedures (including critical care time)  Medications Ordered in UC Medications - No data to display  Initial Impression / Assessment and Plan / UC Course  I have reviewed the triage vital signs and the nursing notes.  Pertinent labs & imaging results that were available during my care of the patient  were reviewed by me and considered in my medical decision making (see chart for details).   STD exposure. STD testing pending. Treated empirically with Azithromycin and Rocephin.  Patient agreed with plan.   Final Clinical Impressions(s) / UC Diagnoses   Final diagnoses:  Encounter for assessment of STD exposure     Discharge Instructions     Attached your labs from recent visit 11/26/18.    ED Prescriptions    None     Controlled Substance Prescriptions  Controlled Substance Registry consulted? Not Applicable   Scot Jun, [place] 01/16/19 1445

## 2019-01-19 LAB — CERVICOVAGINAL ANCILLARY ONLY
Bacterial vaginitis: NEGATIVE
Candida vaginitis: POSITIVE — AB
Chlamydia: NEGATIVE
Neisseria Gonorrhea: NEGATIVE
Trichomonas: NEGATIVE

## 2019-01-20 ENCOUNTER — Telehealth (HOSPITAL_COMMUNITY): Payer: Self-pay | Admitting: Emergency Medicine

## 2019-01-20 MED ORDER — FLUCONAZOLE 150 MG PO TABS: 150.0000 mg | ORAL_TABLET | Freq: Once | ORAL | 0 refills | Status: AC

## 2019-01-20 NOTE — Telephone Encounter (Signed)
Test for candida (yeast) was positive.  Prescription for fluconazole 150mg po now, repeat dose in 3d if needed, #2 no refills, sent to the pharmacy of record.  Recheck or followup with PCP for further evaluation if symptoms are not improving.    Attempted to reach patient. No answer at this time. Voicemail left.     

## 2019-01-20 NOTE — Telephone Encounter (Signed)
Patient contacted and made aware of swab results, all questions answered.   

## 2019-05-14 ENCOUNTER — Inpatient Hospital Stay (HOSPITAL_COMMUNITY): Payer: BLUE CROSS/BLUE SHIELD

## 2019-05-14 ENCOUNTER — Encounter (HOSPITAL_COMMUNITY): Payer: Self-pay | Admitting: Obstetrics & Gynecology

## 2019-05-14 ENCOUNTER — Other Ambulatory Visit: Payer: Self-pay

## 2019-05-14 ENCOUNTER — Inpatient Hospital Stay (HOSPITAL_COMMUNITY)
Admission: AD | Admit: 2019-05-14 | Discharge: 2019-05-14 | Disposition: A | Discharge status: Home / Self Care | Payer: BLUE CROSS/BLUE SHIELD | Attending: Obstetrics & Gynecology | Admitting: Obstetrics & Gynecology

## 2019-05-14 DIAGNOSIS — O26891 Other specified pregnancy related conditions, first trimester: Secondary | ICD-10-CM | POA: Insufficient documentation

## 2019-05-14 DIAGNOSIS — R0602 Shortness of breath: Secondary | ICD-10-CM | POA: Diagnosis not present

## 2019-05-14 DIAGNOSIS — Z3A01 Less than 8 weeks gestation of pregnancy: Secondary | ICD-10-CM | POA: Diagnosis not present

## 2019-05-14 DIAGNOSIS — O99331 Smoking (tobacco) complicating pregnancy, first trimester: Secondary | ICD-10-CM | POA: Insufficient documentation

## 2019-05-14 DIAGNOSIS — O99891 Other specified diseases and conditions complicating pregnancy: Secondary | ICD-10-CM | POA: Insufficient documentation

## 2019-05-14 DIAGNOSIS — O3481 Maternal care for other abnormalities of pelvic organs, first trimester: Secondary | ICD-10-CM | POA: Diagnosis not present

## 2019-05-14 DIAGNOSIS — M546 Pain in thoracic spine: Secondary | ICD-10-CM | POA: Insufficient documentation

## 2019-05-14 DIAGNOSIS — Z79899 Other long term (current) drug therapy: Secondary | ICD-10-CM | POA: Insufficient documentation

## 2019-05-14 DIAGNOSIS — M545 Low back pain: Secondary | ICD-10-CM | POA: Insufficient documentation

## 2019-05-14 DIAGNOSIS — F17210 Nicotine dependence, cigarettes, uncomplicated: Secondary | ICD-10-CM | POA: Insufficient documentation

## 2019-05-14 DIAGNOSIS — Z3491 Encounter for supervision of normal pregnancy, unspecified, first trimester: Secondary | ICD-10-CM | POA: Diagnosis not present

## 2019-05-14 DIAGNOSIS — N83209 Unspecified ovarian cyst, unspecified side: Secondary | ICD-10-CM

## 2019-05-14 DIAGNOSIS — R102 Pelvic and perineal pain: Secondary | ICD-10-CM | POA: Diagnosis not present

## 2019-05-14 DIAGNOSIS — Z3490 Encounter for supervision of normal pregnancy, unspecified, unspecified trimester: Secondary | ICD-10-CM

## 2019-05-14 LAB — WET PREP, GENITAL
Sperm: NONE SEEN
Trich, Wet Prep: NONE SEEN
Yeast Wet Prep HPF POC: NONE SEEN

## 2019-05-14 LAB — CBC
HCT: 36.9 % (ref 36.0–46.0)
Hemoglobin: 12.3 g/dL (ref 12.0–15.0)
MCH: 31 pg (ref 26.0–34.0)
MCHC: 33.3 g/dL (ref 30.0–36.0)
MCV: 92.9 fL (ref 80.0–100.0)
Platelets: 197 10*3/uL (ref 150–400)
RBC: 3.97 MIL/uL (ref 3.87–5.11)
RDW: 11.9 % (ref 11.5–15.5)
WBC: 4.4 10*3/uL (ref 4.0–10.5)
nRBC: 0 % (ref 0.0–0.2)

## 2019-05-14 LAB — URINALYSIS, ROUTINE W REFLEX MICROSCOPIC
Bilirubin Urine: NEGATIVE
Glucose, UA: NEGATIVE mg/dL
Hgb urine dipstick: NEGATIVE
Ketones, ur: NEGATIVE mg/dL
Leukocytes,Ua: NEGATIVE
Nitrite: NEGATIVE
Protein, ur: NEGATIVE mg/dL
Specific Gravity, Urine: 1.026 (ref 1.005–1.030)
pH: 6 (ref 5.0–8.0)

## 2019-05-14 LAB — POCT PREGNANCY, URINE: Preg Test, Ur: POSITIVE — AB

## 2019-05-14 LAB — ABO/RH: ABO/RH(D): O POS

## 2019-05-14 LAB — HCG, QUANTITATIVE, PREGNANCY: hCG, Beta Chain, Quant, S: 29559 m[IU]/mL — ABNORMAL HIGH (ref <5)

## 2019-05-14 NOTE — Discharge Instructions (Signed)
[facility] [place] for [place] @ [facility])  Phone: [phone] Dr. Elly Modena   [facility] [place] @ [place]   Phone: [place] [facility]  [facility]       Phone: [phone] Dr. Harolyn Rutherford             [facility] [place] @ [place]     Phone: [phone]          [facility] for [place] @ [place]   Phone: [phone] Dr. Ihor Dow  [facility] [place] @ [place]  Phone: [place] for [place] @ [facility])  Phone: [phone]

## 2019-05-14 NOTE — MAU Provider Note (Signed)
History     CSN: [id number]  Arrival date and time: 05/14/19 [id number]   First Provider Initiated Contact with Patient 05/14/19 0957      Chief Complaint  Patient presents with  . Abdominal Pain  . Back Pain  . Possible Pregnancy  . Shortness of Breath   Ann Bruce is a 33 y.o. [id number] at [redacted]w[redacted]d who presents today with back pain, and lower abdominal pain. She states that the pain started about 2 weeks ago. She reports that it comes and goes and is gradually worsening. She rates her pain 7/10. She has not tried any treatments for the pain. She denies any vaginal bleeding. She has had increased white discharge, but denies irritation or odor.   Abdominal Pain Pertinent negatives include no dysuria, fever, frequency, hematuria, nausea or vomiting.  Back Pain This is a new problem. Episode onset: 2 weeks. The problem occurs intermittently. The problem has been gradually worsening since onset. The pain is present in the lumbar spine and thoracic spine. The pain does not radiate. The pain is at a severity of 7/10. Associated symptoms include pelvic pain. Pertinent negatives include no dysuria or fever. Risk factors include pregnancy. She has tried nothing for the symptoms.    OB History    Gravida  3   Para  2   Term  1   Preterm  1   AB  0   Living  2     SAB  0   TAB  0   Ectopic  0   Multiple  0   Live Births  2           Past Medical History:  Diagnosis Date  . Hypertension   . Ovarian cyst   . UTI (urinary tract infection)     Past Surgical History:  Procedure Laterality Date  . APPENDECTOMY    . OVARIAN CYST SURGERY  2009  . VAGINAL DELIVERY  2009    Family History  Problem Relation Age of Onset  . Hypertension Mother   . Hypertension Father     Social History   Tobacco Use  . Smoking status: Current Every Day Smoker    Packs/day: 0.50  . Smokeless tobacco: Never Used  Substance Use Topics  . Alcohol use: Not Currently    Comment:  occasional  . Drug use: Never    Allergies: No Known Allergies  Medications Prior to Admission  Medication Sig Dispense Refill Last Dose  . triamterene-hydrochlorothiazide (MAXZIDE-25) 37.5-25 MG tablet Take 1 tablet by mouth daily. 30 tablet 1 Past Week at Unknown time  . fluconazole (DIFLUCAN) 150 MG tablet Take 1 tablet (150 mg total) by mouth daily. Repeat in 1 week if needed (Patient not taking: Reported on 12/18/2018) 2 tablet 0   . metroNIDAZOLE (FLAGYL) 500 MG tablet Take 1 tablet (500 mg total) by mouth 2 (two) times daily. 14 tablet 0     Review of Systems  Constitutional: Negative for chills and fever.  Gastrointestinal: Negative for nausea and vomiting.  Genitourinary: Positive for pelvic pain. Negative for dysuria, frequency, hematuria, vaginal bleeding and vaginal discharge.  Musculoskeletal: Positive for back pain.   Physical Exam   Blood pressure 127/72, pulse 83, temperature 98.2 F (36.8 C), temperature source Oral, resp. rate 18, height 5\' 8"  (1.727 m), weight 80.6 kg, last menstrual period 04/03/2019, SpO2 100 %.  Physical Exam  Nursing note and vitals reviewed. Constitutional: She is oriented to person, place, and time. She appears well-developed  and well-nourished. No distress.  HENT:  Head: Normocephalic.  Cardiovascular: Normal rate.  Respiratory: Effort normal.  GI: Soft. There is no abdominal tenderness. There is no rebound.  Neurological: She is alert and oriented to person, place, and time.  Skin: Skin is warm and dry.  Psychiatric: She has a normal mood and affect.    Results for orders placed or performed during the hospital encounter of 05/14/19 (from the past 24 hour(s))  Pregnancy, urine POC     Status: Abnormal   Collection Time: 05/14/19  9:10 AM  Result Value Ref Range   Preg Test, Ur POSITIVE (A) NEGATIVE  Urinalysis, Routine w reflex microscopic     Status: Abnormal   Collection Time: 05/14/19  9:14 AM  Result Value Ref Range   Color,  Urine YELLOW YELLOW   APPearance HAZY (A) CLEAR   Specific Gravity, Urine 1.026 1.005 - 1.030   pH 6.0 5.0 - 8.0   Glucose, UA NEGATIVE NEGATIVE mg/dL   Hgb urine dipstick NEGATIVE NEGATIVE   Bilirubin Urine NEGATIVE NEGATIVE   Ketones, ur NEGATIVE NEGATIVE mg/dL   Protein, ur NEGATIVE NEGATIVE mg/dL   Nitrite NEGATIVE NEGATIVE   Leukocytes,Ua NEGATIVE NEGATIVE  Wet prep, genital     Status: Abnormal   Collection Time: 05/14/19 10:35 AM   Specimen: PATH Cytology Cervicovaginal Ancillary Only  Result Value Ref Range   Yeast Wet Prep HPF POC NONE SEEN NONE SEEN   Trich, Wet Prep NONE SEEN NONE SEEN   Clue Cells Wet Prep HPF POC PRESENT (A) NONE SEEN   WBC, Wet Prep HPF POC FEW (A) NONE SEEN   Sperm NONE SEEN   CBC     Status: None   Collection Time: 05/14/19 11:03 AM  Result Value Ref Range   WBC 4.4 4.0 - 10.5 K/uL   RBC 3.97 3.87 - 5.11 MIL/uL   Hemoglobin 12.3 12.0 - 15.0 g/dL   HCT 36.9 36.0 - 46.0 %   MCV 92.9 80.0 - 100.0 fL   MCH 31.0 26.0 - 34.0 pg   MCHC 33.3 30.0 - 36.0 g/dL   RDW 11.9 11.5 - 15.5 %   Platelets 197 150 - 400 K/uL   nRBC 0.0 0.0 - 0.2 %  hCG, quantitative, pregnancy     Status: Abnormal   Collection Time: 05/14/19 11:03 AM  Result Value Ref Range   hCG, Beta Chain, Quant, S 29,559 (H) <5 mIU/mL  ABO/Rh     Status: None   Collection Time: 05/14/19 11:03 AM  Result Value Ref Range   ABO/RH(D)      O POS Performed at [facility], [address], [place], [place] [address]    First trimester pain and bleeding  Result Date: 05/14/2019 CLINICAL DATA:  Abdominal cramping. Estimated gestational age by last menstrual period equals 5 weeks 6 days. EXAM: OBSTETRIC <14 WK [place] AND TRANSVAGINAL OB US TECHNIQUE: Both transabdominal and transvaginal ultrasound examinations were performed for complete evaluation of the gestation as well as the maternal uterus, adnexal regions, and pelvic cul-de-sac. Transvaginal technique was performed to assess early  pregnancy. COMPARISON:  None. FINDINGS: Intrauterine gestational sac: Present Yolk sac:  Present Embryo:  Present Cardiac Activity: Identified Heart Rate: 127 bpm MSD:   mm    w     d CRL:  5.4 mm   6 w   1 d                  [place] EDC:  01/06/2020 Subchorionic hemorrhage:  Small subchorionic hemorrhage. Maternal uterus/adnexae: Ovaries normal. Midline within the pelvis there is a well-circumscribed cystic lesion with fine internal echoes. Mass measures 7.8 x 6.4 x 9.7 cm. On comparison ultrasound there was a complex cystic mass of the RIGHT ovary measuring 9.2 x 6.7 x 9.2 cm and presumed this same lesion mildly contracted. No vascularity by Doppler ultrasound. Additionally adjacent to the LEFT ovary is a 5.4 x 3.7 x 5.2 cm rounded mass which has a solid echogenicity. This is unchanged from 5.4 x 3.7 x 4.1 cm on ultrasound 10/11/2017. No significant internal blood flow by Doppler imaging. IMPRESSION: 1. Single intrauterine gestation with embryo and normal cardiac activity. 2. Estimated gestational age by crown rump length equals 6 weeks 1 day. 3. Minimally complex cystic lesion midline presumably corresponds to large RIGHT ovarian cystic lesion present on comparison ultrasound 2009. 4. Solid hyperechoic mass of the LEFT ovary measuring 5.5 cm also unchanged from ultrasound 2009. Electronically Signed   By: Suzy Bouchard M.D.   On: 05/14/2019 12:03    MAU Course  Procedures  MDM  Assessment and Plan   1. Intrauterine pregnancy   2. Pelvic pain affecting pregnancy in first trimester, antepartum   3. [redacted] weeks gestation of pregnancy   4. Ovarian cyst affecting pregnancy in first trimester, antepartum    DC home Comfort measures reviewed  1st Trimester precautions  RX: none  Return to MAU as needed FU with OB as planned  Follow-up Information    Department, [facility] Follow up.   Contact information: [place] [id number] [place]  DNP, CNM  05/14/19  1:08 PM

## 2019-05-14 NOTE — MAU Note (Signed)
Has been having cramping and back pain for a wk. Period is late, has not done home test 'because she doesn't want to know. 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 Denies cough or fever.

## 2019-05-16 LAB — CULTURE, OB URINE: Culture: <10000 — AB

## 2019-05-17 LAB — GC/CHLAMYDIA PROBE AMP (~~LOC~~) NOT AT ARMC
Chlamydia: NEGATIVE
Comment: NEGATIVE
Comment: NORMAL
Neisseria Gonorrhea: NEGATIVE

## 2019-05-28 NOTE — L&D Delivery Note (Signed)
OB/GYN Faculty Practice Delivery Note  Ann Bruce is a 34 y.o. [id number] s/p vag del at [redacted]w[redacted]d. She was admitted for subjective oligo noted on scheduled BPP, and also with cHTN not requiring antihypertensives.   ROM: 0h 52m with clear fluid GBS Status: pos (PCN x 4 doses) Maximum Maternal Temperature: 98.4  Labor Progress: Marland Kitchen [name] was admitted for IOL on the afternoon of 12/24/19 after receiving a scheduled BPP where her fluid was noted to be subjectively low. She also had cHTN that had been stable without meds. She received cytotec x 3 doses with a cervical foley placement at midnight. Her BP remained stable during labor. From that point she labored spontaneously with SROM followed by vag del.  Delivery Date/Time: December 25, 2019 at 0445 Delivery: Called to room and patient was complete and pushing. Head delivered ROA. Nuchal cord present x 1 and reduced prior to delivery. Shoulder and body delivered in usual fashion. Infant placed on mother's abdomen, dried and stimulated, however there was no cry and so cord was clamped x 2 after 1-minute delay, and cut by FOB with infant taken to warmer for eval by peds team.  Cord blood drawn. Placenta delivered spontaneously with gentle cord traction. Fundus firm with massage and Pitocin. Labia, perineum, vagina, and cervix inspected and found to be intact.   Placenta: spont, intact Complications: none Lacerations: none EBL: 200cc Analgesia: epidural  Postpartum Planning [x]  message to sent to schedule follow-up   Infant: boy  APGARs 1/9  2665gm (5lb 14oz)  Myrtis Ser, CNM  12/25/2019 6:07 AM

## 2019-06-15 ENCOUNTER — Ambulatory Visit (INDEPENDENT_AMBULATORY_CARE_PROVIDER_SITE_OTHER): Payer: Self-pay | Admitting: *Deleted

## 2019-06-15 ENCOUNTER — Other Ambulatory Visit: Payer: Self-pay

## 2019-06-15 DIAGNOSIS — O169 Unspecified maternal hypertension, unspecified trimester: Secondary | ICD-10-CM

## 2019-06-15 DIAGNOSIS — O10919 Unspecified pre-existing hypertension complicating pregnancy, unspecified trimester: Secondary | ICD-10-CM | POA: Insufficient documentation

## 2019-06-15 DIAGNOSIS — O0990 Supervision of high risk pregnancy, unspecified, unspecified trimester: Secondary | ICD-10-CM

## 2019-06-15 NOTE — Progress Notes (Signed)
  Virtual Visit via Telephone Note  I connected with [name] on 06/15/19 at  2:30 PM EST by telephone and verified that I am speaking with the correct person using two identifiers.  Location: Patient: Ann Bruce MRN: [id number] Provider: Derl Barrow, RN   I discussed the limitations, risks, security and privacy concerns of performing an evaluation and management service by telephone and the availability of in person appointments. I also discussed with the patient that there may be a patient responsible charge related to this service. The patient expressed understanding and agreed to proceed.   History of Present Illness: PRENATAL INTAKE SUMMARY  Ann Bruce presents today New OB Nurse Interview.  OB History    Gravida  3   Para  2   Term  1   Preterm  1   AB  0   Living  2     SAB  0   TAB  0   Ectopic  0   Multiple  0   Live Births  2          I have reviewed the patient's medical, obstetrical, social, and family histories, medications, and available lab results.  SUBJECTIVE She complains of headache, backache and pelvic cramping, SOB and dizziness. Patient reported of having an ovarian cyst. Stated that with one of her previous pregnancies she had to have an ovarian cyst removed.    Observations/Objective: Initial nurse interview for history/labs (New OB)  EDD: 01/08/2020 GA: [redacted]w[redacted]d [id number] FHT: non face to face interview   GENERAL APPEARANCE: non face to face interview  Assessment and Plan: High Risk pregnancy Prenatal care- Bruce to be completed at next visit with midwife Advised patient of warning signs of early labor and when to go to MAU.  Patient to sign up for [employer] Rx for BP cuff and weight scale not completed- waiting on Medicaid  Follow Up Instructions:   I discussed the assessment and treatment plan with the patient. The patient was provided an opportunity to ask questions and all were answered. The patient  agreed with the plan and demonstrated an understanding of the instructions.   The patient was advised to call back or seek an in-person evaluation if the symptoms worsen or if the condition fails to improve as anticipated.  I provided 20 minutes of non-face-to-face time during this encounter.   Derl Barrow, RN

## 2019-06-15 NOTE — Patient Instructions (Addendum)
Hypertension During Pregnancy Hypertension is also called high blood pressure. High blood pressure means that the force of your blood moving in your body is too strong. It can cause problems for you and your baby. Different types of high blood pressure can happen during pregnancy. The types are:  High blood pressure before you got pregnant. This is called chronic hypertension.  This can continue during your pregnancy. Your doctor will want to keep checking your blood pressure. You may need medicine to keep your blood pressure under control while you are pregnant. You will need follow-up visits after you have your baby.  High blood pressure that goes up during pregnancy when it was normal before. This is called gestational hypertension. It will usually get better after you have your baby, but your doctor will need to watch your blood pressure to make sure that it is getting better.  Very high blood pressure during pregnancy. This is called preeclampsia. Very high blood pressure is an emergency that needs to be checked and treated right away.  You may develop very high blood pressure after giving birth. This is called postpartum preeclampsia. This usually occurs within 48 hours after childbirth but may occur up to 6 weeks after giving birth. This is rare. How does this affect me? If you have high blood pressure during pregnancy, you have a higher chance of developing high blood pressure:  As you get older.  If you get pregnant again. In some cases, high blood pressure during pregnancy can cause:  Stroke.  Heart attack.  Damage to the kidneys, lungs, or liver.  Preeclampsia.  Jerky movements you cannot control (convulsions or seizures).  Problems with the placenta. How does this affect my baby? Your baby may:  Be born early.  Not weigh as much as he or she should.  Not handle labor well, leading to a c-section birth. What are the risks?  Having high blood pressure during a  past pregnancy.  Being overweight.  Being 31 years old or older.  Being pregnant for the first time.  Being pregnant with more than one baby.  Becoming pregnant using fertility methods, such as IVF.  Having other problems, such as diabetes, or kidney disease.  Having family members who have high blood pressure. What can I do to lower my risk?   Keep a healthy weight.  Eat a healthy diet.  Follow what your doctor tells you about treating any medical problems that you had before becoming pregnant. It is very important to go to all of your doctor visits. Your doctor will check your blood pressure and make sure that your pregnancy is progressing as it should. Treatment should start early if a problem is found. How is this treated? Treatment for high blood pressure during pregnancy can differ depending on the type of high blood pressure you have and how serious it is.  You may need to take blood pressure medicine.  If you have been taking medicine for your blood pressure, you may need to change the medicine during pregnancy if it is not safe for your baby.  If your doctor thinks that you could get very high blood pressure, he or she may tell you to take a low-dose aspirin during your pregnancy.  If you have very high blood pressure, you may need to stay in the hospital so you and your baby can be watched closely. You may also need to take medicine to lower your blood pressure. This medicine may be given  by mouth or through an IV tube.  In some cases, if your condition gets worse, you may need to have your baby early. Follow these instructions at home: Eating and drinking   Drink enough fluid to keep your pee (urine) pale yellow.  Avoid caffeine. Lifestyle  Do not use any products that contain nicotine or tobacco, such as cigarettes, e-cigarettes, and chewing tobacco. If you need help quitting, ask your doctor.  Do not use alcohol or drugs.  Avoid stress.  Rest and get  plenty of sleep.  Regular exercise can help. Ask your doctor what kinds of exercise are best for you. General instructions  Take over-the-counter and prescription medicines only as told by your doctor.  Keep all prenatal and follow-up visits as told by your doctor. This is important. Contact a doctor if:  You have symptoms that your doctor told you to watch for, such as: ? Headaches. ? Nausea. ? Vomiting. ? Belly (abdominal) pain. ? Dizziness. ? Light-headedness. Get help right away if:  You have: ? Very bad belly pain that does not get better with treatment. ? A very bad headache that does not get better. ? Vomiting that does not get better. ? Sudden, fast weight gain. ? Sudden swelling in your hands, ankles, or face. ? Bleeding from your vagina. ? Blood in your pee. ? Blurry vision. ? Double vision. ? Shortness of breath. ? Chest pain. ? Weakness on one side of your body. ? Trouble talking.  Your baby is not moving as much as usual. Summary  High blood pressure is also called hypertension.  High blood pressure means that the force of your blood moving in your body is too strong.  High blood pressure can cause problems for you and your baby.  Keep all follow-up visits as told by your doctor. This is important. This information is not intended to replace advice given to you by your health care provider. Make sure you discuss any questions you have with your health care provider. Document Revised: 09/03/2018 Document Reviewed: 06/09/2018 Elsevier Patient Education  [place].  Ovarian Cyst An ovarian cyst is a fluid-filled sac on an ovary. The ovaries are organs that make eggs in women. Most ovarian cysts go away on their own and are not cancerous (are benign). Some cysts need treatment. Follow these instructions at home:  Take over-the-counter and prescription medicines only as told by your doctor.  Do not drive or use heavy machinery while taking  prescription pain medicine.  Get pelvic exams and Pap tests as often as told by your doctor.  Return to your normal activities as told by your doctor. Ask your doctor what activities are safe for you.  Do not use any products that contain nicotine or tobacco, such as cigarettes and e-cigarettes. If you need help quitting, ask your doctor.  Keep all follow-up visits as told by your doctor. This is important. Contact a doctor if:  Your periods are: ? Late. ? Irregular. ? Painful.   Your periods stop.  You have pelvic pain that does not go away.  You have pressure on your bladder.  You have trouble making your bladder empty when you pee (urinate).  You have pain during sex.  You have any of the following in your belly (abdomen): ? A feeling of fullness. ? Pressure. ? Discomfort. ? Pain that does not go away. ? Swelling.  You feel sick most of the time.  You have trouble pooping (have constipation).  You  are not as hungry as usual (you lose your appetite).  You get very bad acne.  You start to have more hair on your body and face.  You are gaining weight or losing weight without changing your exercise and eating habits.  You think you may be pregnant. Get help right away if:  You have belly pain that is very bad or gets worse.  You cannot eat or drink without throwing up (vomiting).  You suddenly get a fever.  Your period is a lot heavier than usual. This information is not intended to replace advice given to you by your health care provider. Make sure you discuss any questions you have with your health care provider. Document Revised: 04/25/2017 Document Reviewed: 10/15/2015 Elsevier Patient Education  2020 [employer].  Warning Signs During Pregnancy A pregnancy lasts about 40 weeks, starting from the first day of your last period until the baby is born. Pregnancy is divided into three phases called trimesters.  The first trimester refers to week 1  through week 13 of pregnancy.  The second trimester is the start of week 14 through the end of week 27.  The third trimester is the start of week 28 until you deliver your baby. During each trimester of pregnancy, certain signs and symptoms may indicate a problem. Talk with your health care provider about your current health and any medical conditions you have. Make sure you know the symptoms that you should watch for and report. How does this affect me?  Warning signs in the first trimester While some changes during the first trimester may be uncomfortable, most do not represent a serious problem. Let your health care provider know if you have any of the following warning signs in the first trimester:  You cannot eat or drink without vomiting, and this lasts for longer than a day.  You have vaginal bleeding or spotting along with menstrual-like cramping.  You have diarrhea for longer than a day.  You have a fever or other signs of infection, such as: ? Pain or burning when you urinate. ? Foul smelling or thick or yellowish vaginal discharge. Warning signs in the second trimester As your baby grows and changes during the second trimester, there are additional signs and symptoms that may indicate a problem. These include:  Signs and symptoms of infection, including a fever.  Signs or symptoms of a miscarriage or preterm labor, such as regular contractions, menstrual-like cramping, or lower abdominal pain.  Bloody or watery vaginal discharge or obvious vaginal bleeding.  Feeling like your heart is pounding.  Having trouble breathing.  Nausea, vomiting, or diarrhea that lasts for longer than a day.  Craving non-food items, such as clay, chalk, or dirt. This may be a sign of a very treatable medical condition called pica. Later in your second trimester, watch for signs and symptoms of a serious medical condition called preeclampsia.These include:  Changes in your vision.  A severe  headache that does not go away.  Nausea and vomiting. It is also important to notice if your baby stops moving or moves less than usual during this time. Warning signs in the third trimester As you approach the third trimester, your baby is growing and your body is preparing for the birth of your baby. In your third trimester, be sure to let your health care provider know if:  You have signs and symptoms of infection, including a fever.  You have vaginal bleeding.  You notice that your baby is  moving less than usual or is not moving.  You have nausea, vomiting, or diarrhea that lasts for longer than a day.  You have a severe headache that does not go away.  You have vision changes, including seeing spots or having blurry or double vision.  You have increased swelling in your hands or face. How does this affect my baby? Throughout your pregnancy, always report any of the warning signs of a problem to your health care provider. This can help prevent complications that may affect your baby, including:  Increased risk for premature birth.  Infection that may be transmitted to your baby.  Increased risk for stillbirth. Contact a health care provider if:  You have any of the warning signs of a problem for the current trimester of your pregnancy.  Any of the following apply to you during any trimester of pregnancy: ? You have strong emotions, such as sadness or anxiety, that interfere with work or personal relationships. ? You feel unsafe in your home and need help finding a safe place to live. ? You are using tobacco products, alcohol, or drugs and you need help to stop. Get help right away if: You have signs or symptoms of labor before 37 weeks of pregnancy. These include:  Contractions that are 5 minutes or less apart, or that increase in frequency, intensity, or length.  Sudden, sharp abdominal pain or low back pain.  Uncontrolled gush or trickle of fluid from your  vagina. Summary  A pregnancy lasts about 40 weeks, starting from the first day of your last period until the baby is born. Pregnancy is divided into three phases called trimesters. Each trimester has warning signs to watch for.  Always report any warning signs to your health care provider in order to prevent complications that may affect both you and your baby.  Talk with your health care provider about your current health and any medical conditions you have. Make sure you know the symptoms that you should watch for and report. This information is not intended to replace advice given to you by your health care provider. Make sure you discuss any questions you have with your health care provider. Document Revised: 09/01/2018 Document Reviewed: 02/27/2017 Elsevier Patient Education  [place].  Pregnancy and Smoking Smoking during pregnancy is unhealthy for you and your baby. Smoke from cigarettes, e-cigarettes, pipes, and cigars contains many chemicals that can cause cancer (carcinogens). These products also contain a stimulant drug (nicotine). When you smoke, harmful substances that you breathe in enter your bloodstream and can be passed on to your baby. This can affect your baby's development. If you are planning to become pregnant or have recently become pregnant, talk with your health care provider about quitting smoking. You have a much better chance of having a healthy pregnancy and a healthy baby if you do not smoke while you are pregnant. How does smoking affect me? Smoking increases your risk for many long-term (chronic) diseases. These diseases include cancer, lung diseases, and heart disease. Smoking during pregnancy increases your risk of:  Losing the pregnancy (miscarriage or stillbirth).  Giving birth too early (premature birth).  Pregnancy outside of the uterus (tubal pregnancy).  Problems with the placenta, which is the organ that provides the baby nourishment and  oxygen. These problems may include: ? Attachment of the placenta over the opening of the uterus (placenta previa). ? Detachment of the placenta before the baby's birth (placental abruption).  Having your water break before labor begins.  How does smoking affect my baby? Before birth Smoking during pregnancy:  Decreases blood flow and oxygen to your baby.  Increases your baby's risk of birth defects, such as heart defects.  Increases your baby's heart rate.  Slows your baby's growth in the uterus (intrauterine growth retardation). After birth Babies born to women who smoked during pregnancy may:  Have symptoms of nicotine withdrawal.  Be born with a cleft lip, cleft palate, or other facial deformities.  Be too small at birth.  Have a high risk of: ? Serious health problems or lifelong disabilities. These may result in the long-term need for certain medicines, therapies, or other treatments. ? Sudden infant death syndrome (SIDS). Follow these instructions at home:   Do not use any products that contain nicotine or tobacco, such as cigarettes, e-cigarettes, and chewing tobacco. If you need help quitting, ask your health care provider.  Talk with your health care provider about support strategies to quit smoking. Some methods to consider include: ? Counseling (smoking cessation counseling). ? Psychotherapy. ? Acupuncture. ? Hypnosis. ? Telephone hotlines for people trying to quit.  Do not take nicotine supplements or medicine to help you quit smoking unless your health care provider tells you to do so.  Avoid secondhand smoke. Ask people who smoke to avoid smoking around you.  Identify people, places, things, and activities that make you want to smoke (triggers). Avoid them. Where to find more information Learn more about smoking during pregnancy and quitting smoking from:  March of Dimes: www.marchofdimes.org  U.S. Department of Health and Human Services:  women.smokefree.gov  American Cancer Society: www.cancer.org  American Heart Association: www.heart.[place]: www.cancer.gov For help to quit smoking:  National smoking cessation telephone hotline: 1-800-QUIT NOW 514-864-8291) Contact a health care provider if:  You are struggling to quit smoking.  You are a smoker and you become pregnant or plan to become pregnant.  You start smoking again after giving birth. Summary  Smoking during pregnancy is unhealthy for you and your baby.  Tobacco smoke contains harmful substances that can affect a baby's health and development.  Smoking increases the risk for serious problems, such as miscarriage, birth defects, or premature birth.  If you need help to quit smoking, talk to your health care provider and ask about support strategies such as counseling. This information is not intended to replace advice given to you by your health care provider. Make sure you discuss any questions you have with your health care provider. Document Revised: 12/11/2018 Document Reviewed: 12/11/2018 Elsevier Patient Education  [place].  Steps to Quit Smoking Smoking tobacco is the leading cause of preventable death. It can affect almost every organ in the body. Smoking puts you and people around you at risk for many serious, long-lasting (chronic) diseases. Quitting smoking can be hard, but it is one of the best things that you can do for your health. It is never too late to quit. How do I get ready to quit? When you decide to quit smoking, make a plan to help you succeed. Before you quit:  Pick a date to quit. Set a date within the next 2 weeks to give you time to prepare.  Write down the reasons why you are quitting. Keep this list in places where you will see it often.  Tell your family, friends, and co-workers that you are quitting. Their support is important.  Talk with your doctor about the choices that may help you  quit.  Find  out if your health insurance will pay for these treatments.  Know the people, places, things, and activities that make you want to smoke (triggers). Avoid them. What first steps can I take to quit smoking?  Throw away all cigarettes at home, at work, and in your car.  Throw away the things that you use when you smoke, such as ashtrays and lighters.  Clean your car. Make sure to empty the ashtray.  Clean your home, including curtains and carpets. What can I do to help me quit smoking? Talk with your doctor about taking medicines and seeing a counselor at the same time. You are more likely to succeed when you do both.  If you are pregnant or breastfeeding, talk with your doctor about counseling or other ways to quit smoking. Do not take medicine to help you quit smoking unless your doctor tells you to do so. To quit smoking: Quit right away  Quit smoking totally, instead of slowly cutting back on how much you smoke over a period of time.  Go to counseling. You are more likely to quit if you go to counseling sessions regularly. Take medicine You may take medicines to help you quit. Some medicines need a prescription, and some you can buy over-the-counter. Some medicines may contain a drug called nicotine to replace the nicotine in cigarettes. Medicines may:  Help you to stop having the desire to smoke (cravings).  Help to stop the problems that come when you stop smoking (withdrawal symptoms). Your doctor may ask you to use:  Nicotine patches, gum, or lozenges.  Nicotine inhalers or sprays.  Non-nicotine medicine that is taken by mouth. Find resources Find resources and other ways to help you quit smoking and remain smoke-free after you quit. These resources are most helpful when you use them often. They include:  Online chats with a Social worker.  Phone quitlines.  Printed Furniture conservator/restorer.  Support groups or group counseling.  Text messaging  programs.  Mobile phone apps. Use apps on your mobile phone or tablet that can help you stick to your quit plan. There are many free apps for mobile phones and tablets as well as websites. Examples include Quit Guide from the [employer] and smokefree.gov  What things can I do to make it easier to quit?   Talk to your family and friends. Ask them to support and encourage you.  Call a phone quitline (1-800-QUIT-NOW), reach out to support groups, or work with a Social worker.  Ask people who smoke to not smoke around you.  Avoid places that make you want to smoke, such as: ? Bars. ? Parties. ? Smoke-break areas at work.  Spend time with people who do not smoke.  Lower the stress in your life. Stress can make you want to smoke. Try these things to help your stress: ? Getting regular exercise. ? Doing deep-breathing exercises. ? Doing yoga. ? Meditating. ? Doing a body scan. To do this, close your eyes, focus on one area of your body at a time from head to toe. Notice which parts of your body are tense. Try to relax the muscles in those areas. How will I feel when I quit smoking? Day 1 to 3 weeks Within the first 24 hours, you may start to have some problems that come from quitting tobacco. These problems are very bad 2-3 days after you quit, but they do not often last for more than 2-3 weeks. You may get these symptoms:  Mood swings.  Feeling restless,  nervous, angry, or annoyed.  Trouble concentrating.  Dizziness.  Strong desire for high-sugar foods and nicotine.  Weight gain.  Trouble pooping (constipation).  Feeling like you may vomit (nausea).  Coughing or a sore throat.  Changes in how the medicines that you take for other issues work in your body.  Depression.  Trouble sleeping (insomnia). Week 3 and afterward After the first 2-3 weeks of quitting, you may start to notice more positive results, such as:  Better sense of smell and taste.  Less coughing and sore  throat.  Slower heart rate.  Lower blood pressure.  Clearer skin.  Better breathing.  Fewer sick days. Quitting smoking can be hard. Do not give up if you fail the first time. Some people need to try a few times before they succeed. Do your best to stick to your quit plan, and talk with your doctor if you have any questions or concerns. Summary  Smoking tobacco is the leading cause of preventable death. Quitting smoking can be hard, but it is one of the best things that you can do for your health.  When you decide to quit smoking, make a plan to help you succeed.  Quit smoking right away, not slowly over a period of time.  When you start quitting, seek help from your doctor, family, or friends. This information is not intended to replace advice given to you by your health care provider. Make sure you discuss any questions you have with your health care provider. Document Revised: 02/05/2019 Document Reviewed: 08/01/2018 Elsevier Patient Education  [place] of Pregnancy  The first trimester of pregnancy is from week 1 until the end of week 13 (months 1 through 3). During this time, your baby will begin to develop inside you. At 6-8 weeks, the eyes and face are formed, and the heartbeat can be seen on ultrasound. At the end of 12 weeks, all the baby's organs are formed. Prenatal care is all the medical care you receive before the birth of your baby. Make sure you get good prenatal care and follow all of your doctor's instructions. Follow these instructions at home: Medicines  Take over-the-counter and prescription medicines only as told by your doctor. Some medicines are safe and some medicines are not safe during pregnancy.  Take a prenatal vitamin that contains at least 600 micrograms (mcg) of folic acid.  If you have trouble pooping (constipation), take medicine that will make your stool soft (stool softener) if your doctor approves. Eating and  drinking   Eat regular, healthy meals.  Your doctor will tell you the amount of weight gain that is right for you.  Avoid raw meat and uncooked cheese.  If you feel sick to your stomach (nauseous) or throw up (vomit): ? Eat 4 or 5 small meals a day instead of 3 large meals. ? Try eating a few soda crackers. ? Drink liquids between meals instead of during meals.  To prevent constipation: ? Eat foods that are high in fiber, like fresh fruits and vegetables, whole grains, and beans. ? Drink enough fluids to keep your pee (urine) clear or pale yellow. Activity  Exercise only as told by your doctor. Stop exercising if you have cramps or pain in your lower belly (abdomen) or low back.  Do not exercise if it is too hot, too humid, or if you are in a place of great height (high altitude).  Try to avoid standing for long periods of time. Move  your legs often if you must stand in one place for a long time.  Avoid heavy lifting.  Wear low-heeled shoes. Sit and stand up straight.  You can have sex unless your doctor tells you not to. Relieving pain and discomfort  Wear a good support bra if your breasts are sore.  Take warm water baths (sitz baths) to soothe pain or discomfort caused by hemorrhoids. Use hemorrhoid cream if your doctor says it is okay.  Rest with your legs raised if you have leg cramps or low back pain.  If you have puffy, bulging veins (varicose veins) in your legs: ? Wear support hose or compression stockings as told by your doctor. ? Raise (elevate) your feet for 15 minutes, 3-4 times a day. ? Limit salt in your food. Prenatal care  Schedule your prenatal visits by the twelfth week of pregnancy.  Write down your questions. Take them to your prenatal visits.  Keep all your prenatal visits as told by your doctor. This is important. Safety  Wear your seat belt at all times when driving.  Make a list of emergency phone numbers. The list should include numbers  for family, friends, the hospital, and police and fire departments. General instructions  Ask your doctor for a referral to a local prenatal class. Begin classes no later than at the start of month 6 of your pregnancy.  Ask for help if you need counseling or if you need help with nutrition. Your doctor can give you advice or tell you where to go for help.  Do not use hot tubs, steam rooms, or saunas.  Do not douche or use tampons or scented sanitary pads.  Do not cross your legs for long periods of time.  Avoid all herbs and alcohol. Avoid drugs that are not approved by your doctor.  Do not use any tobacco products, including cigarettes, chewing tobacco, and electronic cigarettes. If you need help quitting, ask your doctor. You may get counseling or other support to help you quit.  Avoid cat litter boxes and soil used by cats. These carry germs that can cause birth defects in the baby and can cause a loss of your baby (miscarriage) or stillbirth.  Visit your dentist. At home, brush your teeth with a soft toothbrush. Be gentle when you floss. Contact a doctor if:  You are dizzy.  You have mild cramps or pressure in your lower belly.  You have a nagging pain in your belly area.  You continue to feel sick to your stomach, you throw up, or you have watery poop (diarrhea).  You have a bad smelling fluid coming from your vagina.  You have pain when you pee (urinate).  You have increased puffiness (swelling) in your face, hands, legs, or ankles. Get help right away if:  You have a fever.  You are leaking fluid from your vagina.  You have spotting or bleeding from your vagina.  You have very bad belly cramping or pain.  You gain or lose weight rapidly.  You throw up blood. It may look like coffee grounds.  You are around people who have [place] measles, fifth disease, or chickenpox.  You have a very bad headache.  You have shortness of breath.  You have any kind of  trauma, such as from a fall or a car accident. Summary  The first trimester of pregnancy is from week 1 until the end of week 13 (months 1 through 3).  To take care of yourself and  your unborn baby, you will need to eat healthy meals, take medicines only if your doctor tells you to do so, and do activities that are safe for you and your baby.  Keep all follow-up visits as told by your doctor. This is important as your doctor will have to ensure that your baby is healthy and growing well. This information is not intended to replace advice given to you by your health care provider. Make sure you discuss any questions you have with your health care provider. Document Revised: 09/03/2018 Document Reviewed: 05/21/2016 Elsevier Patient Education  2020 [employer].

## 2019-06-24 ENCOUNTER — Encounter: Payer: Self-pay | Admitting: General Practice

## 2019-06-24 ENCOUNTER — Other Ambulatory Visit (HOSPITAL_COMMUNITY)
Admission: RE | Admit: 2019-06-24 | Discharge: 2019-06-24 | Disposition: A | Discharge status: Home / Self Care | Payer: Medicaid Other | Source: Ambulatory Visit | Attending: Obstetrics and Gynecology | Admitting: Obstetrics and Gynecology

## 2019-06-24 ENCOUNTER — Other Ambulatory Visit: Payer: Self-pay

## 2019-06-24 ENCOUNTER — Ambulatory Visit (INDEPENDENT_AMBULATORY_CARE_PROVIDER_SITE_OTHER): Payer: Self-pay | Admitting: Obstetrics and Gynecology

## 2019-06-24 VITALS — BP 128/81 | HR 97 | Temp 98.5°F | Wt 181.0 lb

## 2019-06-24 DIAGNOSIS — O10911 Unspecified pre-existing hypertension complicating pregnancy, first trimester: Secondary | ICD-10-CM

## 2019-06-24 DIAGNOSIS — O0990 Supervision of high risk pregnancy, unspecified, unspecified trimester: Secondary | ICD-10-CM

## 2019-06-24 DIAGNOSIS — O0991 Supervision of high risk pregnancy, unspecified, first trimester: Secondary | ICD-10-CM

## 2019-06-24 DIAGNOSIS — O10919 Unspecified pre-existing hypertension complicating pregnancy, unspecified trimester: Secondary | ICD-10-CM

## 2019-06-24 DIAGNOSIS — Z124 Encounter for screening for malignant neoplasm of cervix: Secondary | ICD-10-CM

## 2019-06-24 DIAGNOSIS — Z3A11 11 weeks gestation of pregnancy: Secondary | ICD-10-CM

## 2019-06-24 MED ORDER — ASPIRIN 81 MG PO CHEW: 81.0000 mg | CHEWABLE_TABLET | Freq: Every day | ORAL | 6 refills | Status: DC

## 2019-06-24 MED ORDER — LABETALOL HCL 200 MG PO TABS: 200.0000 mg | ORAL_TABLET | Freq: Two times a day (BID) | ORAL | 6 refills | Status: DC

## 2019-06-25 ENCOUNTER — Telehealth: Payer: Self-pay | Admitting: Obstetrics and Gynecology

## 2019-06-25 ENCOUNTER — Encounter: Payer: Self-pay | Admitting: Obstetrics and Gynecology

## 2019-06-25 DIAGNOSIS — O0990 Supervision of high risk pregnancy, unspecified, unspecified trimester: Secondary | ICD-10-CM

## 2019-06-25 LAB — OBSTETRIC PANEL, INCLUDING HIV
Antibody Screen: NEGATIVE
Basophils Absolute: 0 10*3/uL (ref 0.0–0.2)
Basos: 0 %
EOS (ABSOLUTE): 0 10*3/uL (ref 0.0–0.4)
Eos: 0 %
HIV Screen 4th Generation wRfx: NONREACTIVE
Hematocrit: 35.6 % (ref 34.0–46.6)
Hemoglobin: 11.9 g/dL (ref 11.1–15.9)
Hepatitis B Surface Ag: NEGATIVE
Immature Grans (Abs): 0 10*3/uL (ref 0.0–0.1)
Immature Granulocytes: 0 %
Lymphocytes Absolute: 2 10*3/uL (ref 0.7–3.1)
Lymphs: 28 %
MCH: 30.8 pg (ref 26.6–33.0)
MCHC: 33.4 g/dL (ref 31.5–35.7)
MCV: 92 fL (ref 79–97)
Monocytes Absolute: 0.5 10*3/uL (ref 0.1–0.9)
Monocytes: 8 %
Neutrophils Absolute: 4.5 10*3/uL (ref 1.4–7.0)
Neutrophils: 64 %
Platelets: 248 10*3/uL (ref 150–450)
RBC: 3.86 x10E6/uL (ref 3.77–5.28)
RDW: 12.3 % (ref 11.7–15.4)
RPR Ser Ql: NONREACTIVE
Rh Factor: POSITIVE
Rubella Antibodies, IGG: 22.9 index (ref >0.99)
WBC: 7.1 10*3/uL (ref 3.4–10.8)

## 2019-06-25 LAB — COMPREHENSIVE METABOLIC PANEL
ALT: 14 IU/L (ref 0–32)
AST: 13 IU/L (ref 0–40)
Albumin/Globulin Ratio: 1.4 (ref 1.2–2.2)
Albumin: 4 g/dL (ref 3.8–4.8)
Alkaline Phosphatase: 30 IU/L — ABNORMAL LOW (ref 39–117)
BUN/Creatinine Ratio: 18 (ref 9–23)
BUN: 8 mg/dL (ref 6–20)
Bilirubin Total: <0.2 mg/dL (ref 0.0–1.2)
CO2: 22 mmol/L (ref 20–29)
Calcium: 9.2 mg/dL (ref 8.7–10.2)
Chloride: 104 mmol/L (ref 96–106)
Creatinine, Ser: 0.45 mg/dL — ABNORMAL LOW (ref 0.57–1.00)
GFR calc Af Amer: 151 mL/min/{1.73_m2} (ref >59)
GFR calc non Af Amer: 131 mL/min/{1.73_m2} (ref >59)
Globulin, Total: 2.9 g/dL (ref 1.5–4.5)
Glucose: 68 mg/dL (ref 65–99)
Potassium: 3.9 mmol/L (ref 3.5–5.2)
Sodium: 140 mmol/L (ref 134–144)
Total Protein: 6.9 g/dL (ref 6.0–8.5)

## 2019-06-25 LAB — HEMOGLOBIN A1C
Est. average glucose Bld gHb Est-mCnc: 114 mg/dL
Hgb A1c MFr Bld: 5.6 % (ref 4.8–5.6)

## 2019-06-25 LAB — PROTEIN / CREATININE RATIO, URINE
Creatinine, Urine: 170.8 mg/dL
Protein, Ur: 12.6 mg/dL
Protein/Creat Ratio: 74 mg/g creat (ref 0–200)

## 2019-06-25 MED ORDER — PRENATAL VITAMINS 28-0.8 MG PO TABS: 1.0000 | ORAL_TABLET | Freq: Every day | ORAL | 12 refills | Status: DC

## 2019-06-25 NOTE — Patient Instructions (Addendum)
First Trimester of Pregnancy The first trimester of pregnancy is from week 1 until the end of week 13 (months 1 through 3). A week after a sperm fertilizes an egg, the egg will implant on the wall of the uterus. This embryo will begin to develop into a baby. Genes from you and your partner will form the baby. The female genes will determine whether the baby will be a boy or a girl. At 6-8 weeks, the eyes and face will be formed, and the heartbeat can be seen on ultrasound. At the end of 12 weeks, all the baby's organs will be formed. Now that you are pregnant, you will want to do everything you can to have a healthy baby. Two of the most important things are to get good prenatal care and to follow your health care provider's instructions. Prenatal care is all the medical care you receive before the baby's birth. This care will help prevent, find, and treat any problems during the pregnancy and childbirth. Body changes during your first trimester Your body goes through many changes during pregnancy. The changes vary from woman to woman.  You may gain or lose a couple of pounds at first.  You may feel sick to your stomach (nauseous) and you may throw up (vomit). If the vomiting is uncontrollable, call your health care provider.  You may tire easily.  You may develop headaches that can be relieved by medicines. All medicines should be approved by your health care provider.  You may urinate more often. Painful urination may mean you have a bladder infection.  You may develop heartburn as a result of your pregnancy.  You may develop constipation because certain hormones are causing the muscles that push stool through your intestines to slow down.  You may develop hemorrhoids or swollen veins (varicose veins).  Your breasts may begin to grow larger and become tender. Your nipples may stick out more, and the tissue that surrounds them (areola) may become darker.  Your gums may bleed and may be  sensitive to brushing and flossing.  Dark spots or blotches (chloasma, mask of pregnancy) may develop on your face. This will likely fade after the baby is born.  Your menstrual periods will stop.  You may have a loss of appetite.  You may develop cravings for certain kinds of food.  You may have changes in your emotions from day to day, such as being excited to be pregnant or being concerned that something may go wrong with the pregnancy and baby.  You may have more vivid and strange dreams.  You may have changes in your hair. These can include thickening of your hair, rapid growth, and changes in texture. Some women also have hair loss during or after pregnancy, or hair that feels dry or thin. Your hair will most likely return to normal after your baby is born. What to expect at prenatal visits During a routine prenatal visit:  You will be weighed to make sure you and the baby are growing normally.  Your blood pressure will be taken.  Your abdomen will be measured to track your baby's growth.  The fetal heartbeat will be listened to between weeks 10 and 14 of your pregnancy.  Test results from any previous visits will be discussed. Your health care provider may ask you:  How you are feeling.  If you are feeling the baby move.  If you have had any abnormal symptoms, such as leaking fluid, bleeding, severe headaches, or abdominal   cramping.  If you are using any tobacco products, including cigarettes, chewing tobacco, and electronic cigarettes.  If you have any questions. Other tests that may be performed during your first trimester include:  Blood tests to find your blood type and to check for the presence of any previous infections. The tests will also be used to check for low iron levels (anemia) and protein on red blood cells (Rh antibodies). Depending on your risk factors, or if you previously had diabetes during pregnancy, you may have tests to check for high blood sugar  that affects pregnant women (gestational diabetes).  Urine tests to check for infections, diabetes, or protein in the urine.  An ultrasound to confirm the proper growth and development of the baby.  Fetal screens for spinal cord problems (spina bifida) and Down syndrome.  HIV (human immunodeficiency virus) testing. Routine prenatal testing includes screening for HIV, unless you choose not to have this test.  You may need other tests to make sure you and the baby are doing well. Follow these instructions at home: Medicines  Follow your health care provider's instructions regarding medicine use. Specific medicines may be either safe or unsafe to take during pregnancy.  Take a prenatal vitamin that contains at least 600 micrograms (mcg) of folic acid.  If you develop constipation, try taking a stool softener if your health care provider approves. Eating and drinking   Eat a balanced diet that includes fresh fruits and vegetables, whole grains, good sources of protein such as meat, eggs, or tofu, and low-fat dairy. Your health care provider will help you determine the amount of weight gain that is right for you.  Avoid raw meat and uncooked cheese. These carry germs that can cause birth defects in the baby.  Eating four or five small meals rather than three large meals a day may help relieve nausea and vomiting. If you start to feel nauseous, eating a few soda crackers can be helpful. Drinking liquids between meals, instead of during meals, also seems to help ease nausea and vomiting.  Limit foods that are high in fat and processed sugars, such as fried and sweet foods.  To prevent constipation: ? Eat foods that are high in fiber, such as fresh fruits and vegetables, whole grains, and beans. ? Drink enough fluid to keep your urine clear or pale yellow. Activity  Exercise only as directed by your health care provider. Most women can continue their usual exercise routine during  pregnancy. Try to exercise for 30 minutes at least 5 days a week. Exercising will help you: ? Control your weight. ? Stay in shape. ? Be prepared for labor and delivery.  Experiencing pain or cramping in the lower abdomen or lower back is a good sign that you should stop exercising. Check with your health care provider before continuing with normal exercises.  Try to avoid standing for long periods of time. Move your legs often if you must stand in one place for a long time.  Avoid heavy lifting.  Wear low-heeled shoes and practice good posture.  You may continue to have sex unless your health care provider tells you not to. Relieving pain and discomfort  Wear a good support bra to relieve breast tenderness.  Take warm sitz baths to soothe any pain or discomfort caused by hemorrhoids. Use hemorrhoid cream if your health care provider approves.  Rest with your legs elevated if you have leg cramps or low back pain.  If you develop varicose veins in   your legs, wear support hose. Elevate your feet for 15 minutes, 3-4 times a day. Limit salt in your diet. Prenatal care  Schedule your prenatal visits by the twelfth week of pregnancy. They are usually scheduled monthly at first, then more often in the last 2 months before delivery.  Write down your questions. Take them to your prenatal visits.  Keep all your prenatal visits as told by your health care provider. This is important. Safety  Wear your seat belt at all times when driving.  Make a list of emergency phone numbers, including numbers for family, friends, the hospital, and police and fire departments. General instructions  Ask your health care provider for a referral to a local prenatal education class. Begin classes no later than the beginning of month 6 of your pregnancy.  Ask for help if you have counseling or nutritional needs during pregnancy. Your health care provider can offer advice or refer you to specialists for help  with various needs.  Do not use hot tubs, steam rooms, or saunas.  Do not douche or use tampons or scented sanitary pads.  Do not cross your legs for long periods of time.  Avoid cat litter boxes and soil used by cats. These carry germs that can cause birth defects in the baby and possibly loss of the fetus by miscarriage or stillbirth.  Avoid all smoking, herbs, alcohol, and medicines not prescribed by your health care provider. Chemicals in these products affect the formation and growth of the baby.  Do not use any products that contain nicotine or tobacco, such as cigarettes and e-cigarettes. If you need help quitting, ask your health care provider. You may receive counseling support and other resources to help you quit.  Schedule a dentist appointment. At home, brush your teeth with a soft toothbrush and be gentle when you floss. Contact a health care provider if:  You have dizziness.  You have mild pelvic cramps, pelvic pressure, or nagging pain in the abdominal area.  You have persistent nausea, vomiting, or diarrhea.  You have a bad smelling vaginal discharge.  You have pain when you urinate.  You notice increased swelling in your face, hands, legs, or ankles.  You are exposed to fifth disease or chickenpox.  You are exposed to [place] measles (rubella) and have never had it. Get help right away if:  You have a fever.  You are leaking fluid from your vagina.  You have spotting or bleeding from your vagina.  You have severe abdominal cramping or pain.  You have rapid weight gain or loss.  You vomit blood or material that looks like coffee grounds.  You develop a severe headache.  You have shortness of breath.  You have any kind of trauma, such as from a fall or a car accident. Summary  The first trimester of pregnancy is from week 1 until the end of week 13 (months 1 through 3).  Your body goes through many changes during pregnancy. The changes vary from  woman to woman.  You will have routine prenatal visits. During those visits, your health care provider will examine you, discuss any test results you may have, and talk with you about how you are feeling. This information is not intended to replace advice given to you by your health care provider. Make sure you discuss any questions you have with your health care provider. Document Revised: 04/25/2017 Document Reviewed: 04/24/2016 Elsevier Patient Education  2020 [place] gravdica Hyperemesis Gravidarum  La hiperemesis gravdica es una forma grave de nuseas y vmitos que ocurren durante el Media planner. Es peor que las nuseas matutinas. Puede provocarle [place], [place]. Posiblemente provoque que no coma ni beba la cantidad suficiente de alimentos y lquidos, lo que puede conducir a la deshidratacin, la desnutricin, y la prdida de [place]. Generalmente, ocurre durante la primera mitad (las primeras 20semanas) de Media planner. Suele mejorar en la segunda mitad del [place]. Pero, en algunos casos, contina durante todo el [place]. Cules son las causas? Se desconoce la causa de esta afeccin. Puede estar relacionada con cambios en algunas sustancias qumicas (hormonas) del Contractor, como el nivel elevado de la hormona del embarazo (gonadotropina corinica humana) o un aumento de la hormona sexual femenina (estrgeno). Cules son los signos o los sntomas? Los sntomas de esta afeccin incluyen los siguientes:  Nuseas que no desaparecen.  Vmitos que le impiden [employer].  Prdida de peso.  Prdida de lquidos corporales (deshidratacin).  Falta de deseo de comer ni agrado por los alimentos que antes disfrutaba. Cmo se diagnostica? Esta afeccin se puede diagnosticar en funcin de lo siguiente:  Un examen fsico.  Sus antecedentes mdicos.  Sus sntomas.  Anlisis de [place].  Anlisis de  [place]. Cmo se trata? Esta afeccin se trata controlando los sntomas. Esto puede incluir lo siguiente:  Transport planner. Esto puede ayudar a reducir las nuseas y los vmitos.  Tomar medicamentos recetados. Suelen combinarse un plan de alimentacin y de medicamentos para ayudar a Chief Technology Officer los sntomas. Si los medicamentos no ayudan a [employer] nuseas y los vmitos, es posible que deban administrarle lquidos por va intravenosa en el hospital. Siga estas indicaciones en su casa: Comida y bebida   Evite lo siguiente: ? Beber lquidos con las comidas. Intente no beber nada 65minutos antes y despus de las comidas. ? Beber ms de una taza de lquidos a la vez. ? Consumir alimentos que desencadenan sus sntomas. Estos pueden incluir alimentos condimentados, caf, alimentos con alto contenido graso, alimentos muy dulces y cidos. ? Saltearse comidas. Las nuseas pueden ser ms intensas con el estmago vaco. Si no puede [employer], no se obligue. Intente chupar trozos de hielo u otros productos congelados e ingiera las caloras perdidas ms tarde. ? Recostarse [employer] horas siguientes a comer. ? Exponerse a factores desencadenantes ambientales. Estos pueden incluir [employer], habitaciones con humo, espacios cerrados, habitaciones con [employer] fuertes, lugares clidos o hmedos, habitaciones muy ruidosas y habitaciones con luces mviles o parpadeantes. Intente comer en zonas bien ventiladas y sin olores fuertes. ? Cambios rpidos y repentinos en su movimiento. ? Tomar comprimidos de hierro y multivitaminas que contienen hierro. Si toma comprimidos de hierro recetados, no deje de hacerlo, excepto que el mdico la autorice. ? Preparar comida. El olor de la comida puede quitarle el apetito o Development worker, international aid nuseas.  Para ayudar a aliviar sus sntomas: ? Prstele atencin a su cuerpo. Todas las personas son diferentes y Merchandiser, retail.  Descubra qu funciona mejor para usted. ? Coma y beba lentamente. ? Ingiera 5 o 6comidas pequeas por TEFL teacher de tres grandes. Ingerir comidas pequeas y refrigerios puede ayudarle a Optometrist. ? Por la maana, antes de levantarse de la cama, coma un par de galletitas para evitar moverse con el estmago vaco. ? Pruebe comer alimentos con almidn, ya que suelen [employer]. Los ejemplos incluyen cereales, [place],  pan, papas, pasta, arroz y pretzels. ? Incluya al menos una porcin de protenas en las comidas y [place]. Las opciones de protenas incluyen carnes [place], carne de ave, mariscos, frijoles, frutos secos, mantequillas de frutos secos, [place], queso y Estate agent. ? Trate de consumir un refrigerio rico en protenas antes de acostarse. Los ejemplos de un refrigerio rico en protenas incluyen galletitas y [place] o un sndwich de [place] de man preparado con Ardelia Mems rebanada de pan integral y Ardelia Mems cucharadita (5 g) de [place] de man. ? Coma o succione alimentos que contengan jengibre. Esto puede ayudar a [employer]. Aada  de cucharadita de jengibre molido al t caliente o beba t de jengibre. ? Intente tomar jugo 100% de frutas o una bebida de electrolitos. Una bebida de electrolitos contiene sodio, potasio y cloruro. ? Tome lquidos que estn fros, sean transparentes y carbonatados o agrios. Los ejemplos [place], gaseosa de [place], gaseosa de lima-limn, agua helada y soda. ? Cepllese los dientes o use un enjuague bucal despus de las comidas. ? Hable con su mdico sobre comenzar a tomar un suplemento de vitaminaB6. Indicaciones generales  [employer] de venta libre y los recetados solamente como se lo haya indicado el mdico.  Siga las indicaciones del mdico respecto de las restricciones de comidas o bebidas.  Contine tomando las vitaminas prenatales como se lo haya indicado el mdico. Si tiene problemas para tomas sus  vitaminas prenatales, hable con su mdico [employer].  Concurra a todas las visitas previas al parto (prenatales) y las visitas de seguimiento como se lo haya indicado el mdico. Esto es importante. Comunquese con un mdico si:  Tiene dolor de abdomen.  Tiene un dolor de cabeza intenso.  Tiene problemas de visin.  Pierde peso.  Se siente dbil o mareado. Solicite ayuda de inmediato si:  No puede beber lquidos sin vomitar.  Vomita sangre.  Tiene nuseas y vmitos constantes.  Se siente muy dbil.  Se desmaya.  Tiene fiebre, y los sntomas empeoran repentinamente. Resumen  La hiperemesis gravdica es una forma grave de nuseas y vmitos que ocurren durante el Media planner.  Hacer algunos cambios en los hbitos alimenticios puede ayudar a Public house manager las nuseas y los vmitos.  Esta afeccin puede tratarse con medicamentos.  Si los medicamentos no ayudan a [employer] nuseas y los vmitos, es posible que deban administrarle lquidos por va intravenosa en el hospital. Esta informacin no tiene Marine scientist el consejo del mdico. Asegrese de hacerle al mdico cualquier pregunta que tenga. Document Revised: 07/01/2017 Document Reviewed: 12/23/2012 Elsevier Patient Education  [place].

## 2019-06-25 NOTE — Progress Notes (Signed)
INITIAL OBSTETRICAL VISIT Patient name: Ann Bruce MRN [id number]  Date of birth: 09-25-1985 Chief Complaint:   Initial Prenatal Visit  History of Present Illness:   Ann Bruce is a 34 y.o. G77P1102 African American female at 7w6dby LMP with an Estimated Date of Delivery: 01/08/20 being seen today for her initial obstetrical visit.  Her obstetrical history is significant for cHTN and history of PTB. She was taking Maxide for her cHTN, but took herself off that "about 3 weeks ago." She states "my BP has been doing pretty good since I stopped the medication." Her pregnancy history was reviewed with her. She could not recall details, but stated that she had 2 SVD (13 yrs ago and 11 yrs ago). She reports her first son was born "early" and weighed 5 lbs 7 oz and her second son was born at term and weighed 5 lbs 13 oz. Dr. BGracy Racerdelivered both of her sons (and her and her sister). She is disappointed that he didn't tie her tubes, but since she "didn't get pregnant all of these years, (she) thought they did something to her ovaries that prevented her from getting pregnant again." This is an unplanned pregnancy. She and the father of the baby (FOB) have 2 sons together. She has a support system that consists of family and friends. Today she reports no complaints, but is "really concerned about the cysts seen on her ovaries." She reports that she had to have cysts surgically removed from both ovaries when she was "3 months pregnant and that was no joke!"    Patient's last menstrual period was 04/03/2019. Last pap unknown. Results were: normal Review of Systems:   Pertinent items are noted in HPI Denies cramping/contractions, leakage of fluid, vaginal bleeding, abnormal vaginal discharge w/ itching/odor/irritation, headaches, visual changes, shortness of breath, chest pain, abdominal pain, severe nausea/vomiting, or problems with urination or bowel movements unless otherwise stated above.    Pertinent History Reviewed:  Reviewed past medical,surgical, social, obstetrical and family history.  Reviewed problem list, medications and allergies. OB History  Gravida Para Term Preterm AB Living  _0 0 2  SAB TAB Ectopic Multiple Live Births  0 0 0 0 2    # Outcome Date GA Lbr Len/2nd Weight Sex Delivery Anes PTL Lv  3 Current           2 Term 05/22/08   6 lb (2.722 kg) M Vag-Spont   LIV  1 Preterm 11/18/05   5 lb 7 oz (2.466 kg) M Vag-Spont EPI N LIV   Physical Assessment:   Vitals:   06/24/19 1339  BP: 128/81  Pulse: 97  Temp: 98.5 F (36.9 C)  Weight: 181 lb (82.1 kg)  Body mass index is 27.52 kg/m.       Physical Examination:  General appearance - well appearing, and in no distress  Mental status - alert, oriented to person, place, and time  Psych:  She has a normal mood and affect  Skin - warm and dry, normal color, no suspicious lesions noted  Chest - effort normal, all lung fields clear to auscultation bilaterally  Heart - normal rate and regular rhythm  Abdomen - soft, nontender  Extremities:  No swelling or varicosities noted  Pelvic - VULVA: normal appearing vulva with no masses, tenderness or lesions  VAGINA: normal appearing vagina with normal color and discharge, no lesions.   CERVIX: normal appearing cervix without discharge or lesions, no CMT  Thin prep pap is done with HR HPV co-testing - some cervical irritation and spotting noted immediately following pap collection.  Transvaginal Ultrasound Report from 05/14/2019 MAU visit: CLINICAL DATA:  Abdominal cramping. Estimated gestational age by last menstrual period equals 5 weeks 6 days.  EXAM: OBSTETRIC <14 WK [place] AND TRANSVAGINAL OB US  TECHNIQUE: Both transabdominal and transvaginal ultrasound examinations were performed for complete evaluation of the gestation as well as the maternal uterus, adnexal regions, and pelvic cul-de-sac. Transvaginal technique was performed to assess early  pregnancy.  COMPARISON:  None.  FINDINGS: Intrauterine gestational sac: Present  Yolk sac:  Present  Embryo:  Present  Cardiac Activity: Identified  Heart Rate: 127 bpm  MSD:   mm    w     d  CRL:  5.4 mm   6 w   1 d                  [place] EDC: 01/06/2020  Subchorionic hemorrhage:  Small subchorionic hemorrhage.  Maternal uterus/adnexae: Ovaries normal.  Midline within the pelvis there is a well-circumscribed cystic lesion with fine internal echoes. Mass measures 7.8 x 6.4 x 9.7 cm. On comparison ultrasound there was a complex cystic mass of the RIGHT ovary measuring 9.2 x 6.7 x 9.2 cm and presumed this same lesion mildly contracted. No vascularity by Doppler ultrasound.  Additionally adjacent to the LEFT ovary is a 5.4 x 3.7 x 5.2 cm rounded mass which has a solid echogenicity. This is unchanged from 5.4 x 3.7 x 4.1 cm on ultrasound 10/11/2017. No significant internal blood flow by Doppler imaging.  IMPRESSION: 1. Single intrauterine gestation with embryo and normal cardiac activity.  2. Estimated gestational age by crown rump length equals 6 weeks 1 day.  3. Minimally complex cystic lesion midline presumably corresponds to large RIGHT ovarian cystic lesion present on comparison ultrasound 2009.  4. Solid hyperechoic mass of the LEFT ovary measuring 5.5 cm also unchanged from ultrasound 2009.   Electronically Signed   By: Suzy Bouchard M.D.   On: 05/14/2019 12:03  Results for orders placed or performed in visit on 06/24/19 (from the past 24 hour(s))  Comp Met (CMET)   Collection Time: 06/24/19  2:30 PM  Result Value Ref Range   Glucose 68 65 - 99 mg/dL   BUN 8 6 - 20 mg/dL   Creatinine, Ser 0.45 (L) 0.57 - 1.00 mg/dL   GFR calc non Af Amer 131 >59 mL/min/1.73   GFR calc Af Amer 151 >59 mL/min/1.73   BUN/Creatinine Ratio 18 9 - 23   Sodium 140 134 - 144 mmol/L   Potassium 3.9 3.5 - 5.2 mmol/L   Chloride 104 96 - 106 mmol/L   CO2 22  20 - 29 mmol/L   Calcium 9.2 8.7 - 10.2 mg/dL   Total Protein 6.9 6.0 - 8.5 g/dL   Albumin 4.0 3.8 - 4.8 g/dL   Globulin, Total 2.9 1.5 - 4.5 g/dL   Albumin/Globulin Ratio 1.4 1.2 - 2.2   Bilirubin Total <0.2 0.0 - 1.2 mg/dL   Alkaline Phosphatase 30 (L) 39 - 117 IU/L   AST 13 0 - 40 IU/L   ALT 14 0 - 32 IU/L  Obstetric Panel, Including HIV   Collection Time: 06/24/19  2:30 PM  Result Value Ref Range   Hepatitis B Surface Ag Negative Negative   RPR Ser Ql Non Reactive Non Reactive   Rubella Antibodies, IGG WILL FOLLOW    ABO Grouping O  Rh Factor Positive    Antibody Screen Negative Negative   HIV Screen 4th Generation wRfx Non Reactive Non Reactive   WBC 7.1 3.4 - 10.8 x10E3/uL   RBC 3.86 3.77 - 5.28 x10E6/uL   Hemoglobin 11.9 11.1 - 15.9 g/dL   Hematocrit 35.6 34.0 - 46.6 %   MCV 92 79 - 97 fL   MCH 30.8 26.6 - 33.0 pg   MCHC 33.4 31.5 - 35.7 g/dL   RDW 12.3 11.7 - 15.4 %   Platelets 248 150 - 450 x10E3/uL   Neutrophils 64 Not Estab. %   Lymphs 28 Not Estab. %   Monocytes 8 Not Estab. %   Eos 0 Not Estab. %   Basos 0 Not Estab. %   Neutrophils Absolute 4.5 1.4 - 7.0 x10E3/uL   Lymphocytes Absolute 2.0 0.7 - 3.1 x10E3/uL   Monocytes Absolute 0.5 0.1 - 0.9 x10E3/uL   EOS (ABSOLUTE) 0.0 0.0 - 0.4 x10E3/uL   Basophils Absolute 0.0 0.0 - 0.2 x10E3/uL   Immature Granulocytes 0 Not Estab. %   Immature Grans (Abs) 0.0 0.0 - 0.1 x10E3/uL  Hemoglobin A1c   Collection Time: 06/24/19  2:30 PM  Result Value Ref Range   Hgb A1c MFr Bld 5.6 4.8 - 5.6 %   Est. average glucose Bld gHb Est-mCnc 114 mg/dL    Assessment & Plan:  1) High-Risk Pregnancy [id number] at 50w6dwith an Estimated Date of Delivery: 01/08/20   2) Initial OB visit - Welcomed to practice and introduced self to patient in addition to discussing other advanced practice providers that she may be seeing at this practice - Congratulated patient - Anticipatory guidance on upcoming appointments - Educated on [place] and pregnancy and the integration of virtual appointments  - Educated on babyscripts app- patient reports she has not received email, encouraged to look in spam folder and to call office if she still has not received email - patient verbalizes understanding   3) Supervision of high risk pregnancy, antepartum  - [place]FM OB DETAIL +14 WK,  - Obstetric Panel, Including HIV, - Culture, OB Urine,  - Cervicovaginal ancillary only( [place]),  - Cytology - PAP( [place]),  - Genetic Screening,  - Hemoglobin A1c - Msg sent to Dr. PKennon Roundsto review U/S from 12/18 and advise about management of ovarian findings from that exam  4) Chronic hypertension affecting pregnancy  - Rx for aspirin 81 MG chewable tablet,  - CBC,  - Comp Met (CMET),  - Protein / creatinine ratio, urine,  - Rx for labetalol (NORMODYNE) 200 MG tablet    Meds:  Meds ordered this encounter  Medications  . aspirin 81 MG chewable tablet    Sig: Chew 1 tablet (81 mg total) by mouth daily.    Dispense:  30 tablet    Refill:  6    Order Specific Question:   Supervising Provider    Answer:   PDonnamae Jude[[[id number]] . labetalol (NORMODYNE) 200 MG tablet    Sig: Take 1 tablet (200 mg total) by mouth 2 (two) times daily.    Dispense:  60 tablet    Refill:  6    Order Specific Question:   Supervising Provider    Answer:   PDonnamae Jude[[[id number]]   Initial labs obtained Continue prenatal vitamins Reviewed n/v relief measures and warning s/s to report Reviewed recommended weight gain based on pre-gravid BMI Encouraged well-balanced diet Genetic Screening discussed: ordered Cystic fibrosis, SMA,  Fragile X screening discussed ordered The nature of [facility] with multiple MDs and other Advanced Practice Providers was explained to patient; also emphasized that residents, students are part of our team.  Discussed optimized OB schedule and video visits. Advised can have an in-office visit  whenever she feels she needs to be seen.  Does not have own BP cuff or scale. BP cuff and scale Rx will be faxed when her Medicaid is active. Explained to patient that BP cuff and scale will need to be picked up at [facility]. Check BP weekly, let us know if >140/90. Advised to call during normal business hours and there is an after-hours nurse line available.    Follow-up: No follow-ups on file.   Orders Placed This Encounter  Procedures  . Culture, OB Urine  . [place] MFM OB DETAIL +14 WK  . CBC  . Comp Met (CMET)  . Protein / creatinine ratio, urine  . Obstetric Panel, Including HIV  . Genetic Screening  . Hemoglobin A1c    Laury Deep MSN, CNM 06/24/2019

## 2019-06-25 NOTE — Telephone Encounter (Signed)
Patient request Rx for PNV reporting that gummies make her more nauseated. Rx sent to pharmacy on file. Ok per pt.   Laury Deep, CNM

## 2019-06-26 LAB — URINE CULTURE, OB REFLEX

## 2019-06-26 LAB — CULTURE, OB URINE

## 2019-06-28 ENCOUNTER — Telehealth: Payer: Self-pay | Admitting: General Practice

## 2019-06-28 LAB — CERVICOVAGINAL ANCILLARY ONLY
Bacterial Vaginitis (gardnerella): POSITIVE — AB
Candida Glabrata: NEGATIVE
Candida Vaginitis: NEGATIVE
Chlamydia: NEGATIVE
Comment: NEGATIVE
Comment: NEGATIVE
Comment: NEGATIVE
Comment: NEGATIVE
Comment: NEGATIVE
Comment: NORMAL
Neisseria Gonorrhea: NEGATIVE
Trichomonas: NEGATIVE

## 2019-06-28 LAB — CYTOLOGY - PAP
Comment: NEGATIVE
Diagnosis: NEGATIVE
High risk HPV: POSITIVE — AB

## 2019-06-28 NOTE — Telephone Encounter (Signed)
Patient aware of appt with Dr. Harolyn Rutherford on 07/14/2019 at 3:15pm and verbalized understanding.

## 2019-06-29 ENCOUNTER — Telehealth: Payer: Self-pay | Admitting: *Deleted

## 2019-06-29 DIAGNOSIS — B9689 Other specified bacterial agents as the cause of diseases classified elsewhere: Secondary | ICD-10-CM

## 2019-06-29 MED ORDER — METRONIDAZOLE 500 MG PO TABS: 500.0000 mg | ORAL_TABLET | Freq: Two times a day (BID) | ORAL | 0 refills | Status: DC

## 2019-06-29 NOTE — Telephone Encounter (Signed)
-----   Message from Laury Deep, [place] sent at 06/28/2019  9:15 PM EST ----- Please treat for BV

## 2019-07-05 ENCOUNTER — Encounter: Payer: Self-pay | Admitting: General Practice

## 2019-07-09 ENCOUNTER — Encounter: Payer: Self-pay | Admitting: General Practice

## 2019-07-12 ENCOUNTER — Encounter: Payer: Self-pay | Admitting: Obstetrics & Gynecology

## 2019-07-12 DIAGNOSIS — N83202 Unspecified ovarian cyst, left side: Secondary | ICD-10-CM | POA: Insufficient documentation

## 2019-07-12 DIAGNOSIS — N83201 Unspecified ovarian cyst, right side: Secondary | ICD-10-CM | POA: Insufficient documentation

## 2019-07-14 ENCOUNTER — Ambulatory Visit (INDEPENDENT_AMBULATORY_CARE_PROVIDER_SITE_OTHER): Payer: Medicaid Other | Admitting: Obstetrics & Gynecology

## 2019-07-14 ENCOUNTER — Other Ambulatory Visit: Payer: Self-pay

## 2019-07-14 ENCOUNTER — Encounter: Payer: Self-pay | Admitting: Obstetrics & Gynecology

## 2019-07-14 VITALS — BP 130/74 | HR 88 | Temp 98.4°F | Wt 186.0 lb

## 2019-07-14 DIAGNOSIS — O10912 Unspecified pre-existing hypertension complicating pregnancy, second trimester: Secondary | ICD-10-CM

## 2019-07-14 DIAGNOSIS — O3482 Maternal care for other abnormalities of pelvic organs, second trimester: Secondary | ICD-10-CM

## 2019-07-14 DIAGNOSIS — Z3A14 14 weeks gestation of pregnancy: Secondary | ICD-10-CM

## 2019-07-14 DIAGNOSIS — O10919 Unspecified pre-existing hypertension complicating pregnancy, unspecified trimester: Secondary | ICD-10-CM

## 2019-07-14 DIAGNOSIS — O0991 Supervision of high risk pregnancy, unspecified, first trimester: Secondary | ICD-10-CM

## 2019-07-14 DIAGNOSIS — N83209 Unspecified ovarian cyst, unspecified side: Secondary | ICD-10-CM

## 2019-07-14 NOTE — Progress Notes (Addendum)
PRENATAL VISIT NOTE  Subjective:  Ann Bruce is a 34 y.o. G3P1102 at [redacted]w[redacted]d being seen today for ongoing prenatal care.  She is a transfer from [facility] due to her high risk.  She is currently monitored for the following issues for this high-risk pregnancy and has Supervision of high risk pregnancy, antepartum; Hypertension affecting pregnancy, antepartum; and Bilateral ovarian cysts on their problem list.  Patient reports occasional back pain and mild abdominal cramping. Had ELAP, removal of bilateral large dermoid cysts in 12/28/2007 at [redacted] weeks gestation;  5.4 x 3.7 x 4.1 on the left, 9 x 2 x 9.2 on the right.  Done by Dr. Gracy Racer. Recent ultrasound for her aforementioned symptoms shows the cysts are back in the same sizes as the ones excised in 2009!!!  Patient is concerned that she may need surgery again, no acute/severe symptoms.  Contractions: Not present. Vag. Bleeding: None.  Movement: Present. Denies leaking of fluid.   The following portions of the patient's history were reviewed and updated as appropriate: allergies, current medications, past family history, past medical history, past social history, past surgical history and problem list.   Objective:   Vitals:   07/14/19 1530  BP: 130/74  Pulse: 88  Temp: 98.4 F (36.9 C)  Weight: 186 lb (84.4 kg)    Fetal Status: Fetal Heart Rate (bpm): 155   Movement: Present     General:  Alert, oriented and cooperative. Patient is in no acute distress.  Skin: Skin is warm and dry. No rash noted.   Cardiovascular: Normal heart rate noted  Respiratory: Normal respiratory effort, no problems with respiration noted  Abdomen: Soft, gravid, appropriate for gestational age.  Pain/Pressure: Present     Pelvic: Cervical exam deferred        Extremities: Normal range of motion.  Edema: None  Mental Status: Normal mood and affect. Normal behavior. Normal judgment and thought content.   Imaging: First trimester pain and  bleeding  Result Date: 05/14/2019 CLINICAL DATA:  Abdominal cramping. Estimated gestational age by last menstrual period equals 5 weeks 6 days. EXAM: OBSTETRIC <14 WK [place] AND TRANSVAGINAL OB US TECHNIQUE: Both transabdominal and transvaginal ultrasound examinations were performed for complete evaluation of the gestation as well as the maternal uterus, adnexal regions, and pelvic cul-de-sac. Transvaginal technique was performed to assess early pregnancy. COMPARISON:  None. FINDINGS: Intrauterine gestational sac: Present Yolk sac:  Present Embryo:  Present Cardiac Activity: Identified Heart Rate: 127 bpm MSD:   mm    w     d CRL:  5.4 mm   6 w   1 d                  [place] EDC: 01/06/2020 Subchorionic hemorrhage:  Small subchorionic hemorrhage. Maternal uterus/adnexae: Ovaries normal. Midline within the pelvis there is a well-circumscribed cystic lesion with fine internal echoes. Mass measures 7.8 x 6.4 x 9.7 cm. On comparison ultrasound there was a complex cystic mass of the RIGHT ovary measuring 9.2 x 6.7 x 9.2 cm and presumed this same lesion mildly contracted. No vascularity by Doppler ultrasound. Additionally adjacent to the LEFT ovary is a 5.4 x 3.7 x 5.2 cm rounded mass which has a solid echogenicity. This is unchanged from 5.4 x 3.7 x 4.1 cm on ultrasound 10/11/2017. No significant internal blood flow by Doppler imaging. IMPRESSION: 1. Single intrauterine gestation with embryo and normal cardiac activity. 2. Estimated gestational age by crown rump length equals 6 weeks 1  day. 3. Minimally complex cystic lesion midline presumably corresponds to large RIGHT ovarian cystic lesion present on comparison ultrasound 2009. 4. Solid hyperechoic mass of the LEFT ovary measuring 5.5 cm also unchanged from ultrasound 2009. Electronically Signed   By: Suzy Bouchard M.D.   On: 05/14/2019 12:03    Assessment and Plan:  Pregnancy: G3P1102 at [redacted]w[redacted]d 1. Ovarian cyst affecting pregnancy in second trimester, antepartum Will  get MR for further characterization of these cysts and then decide on management. Pain/torsion precautions advised.  If surgery is needed, this will be done in the new few weeks.   2. Chronic hypertension affecting pregnancy Stable BP. Continue ASA and Labetalol.  3. Supervision of high risk pregnancy in first trimester No other complaints or concerns.  Routine obstetric precautions reviewed.  Please refer to After Visit Summary for other counseling recommendations.   Return in about 4 weeks (around 08/11/2019) for Virtual [facility] Visit.  Future Appointments  Date Time Provider [place]  08/03/2019  2:00 PM [facility] NURSE [facility]  08/03/2019  2:00 PM [place] 3 WH-MFCUS MFC-US  08/11/2019  4:15 PM [name], MD [place]    [name], MD

## 2019-07-14 NOTE — Addendum Note (Signed)
Addended by: [name] A on: 07/14/2019 04:03 PM   Modules accepted: Orders

## 2019-07-14 NOTE — Patient Instructions (Signed)
Return to office for any scheduled appointments. Call the office or go to the MAU at [facility] if:  You begin to have strong, frequent contractions  Your water breaks.  Sometimes it is a big gush of fluid, sometimes it is just a trickle that keeps getting your panties wet or running down your legs  You have vaginal bleeding.  It is normal to have a small amount of spotting if your cervix was checked.   You do not feel your baby moving like normal.  If you do not, get something to eat and drink and lay down and focus on feeling your baby move.   If your baby is still not moving like normal, you should call the office or go to MAU.  Any other obstetric concerns.   

## 2019-07-16 MED ORDER — BLOOD PRESSURE MONITORING DEVI: 1.0000 | 0 refills | Status: DC

## 2019-07-16 NOTE — Addendum Note (Signed)
Addended by: Bethanne Ginger on: 07/16/2019 12:15 PM   Modules accepted: Orders

## 2019-07-19 ENCOUNTER — Telehealth: Payer: Self-pay

## 2019-07-19 NOTE — Telephone Encounter (Signed)
Called pt and informed pt that her MRI has been scheduled for 07/28/19 @ 1130 at [facility].  I advised pt that she can get the address to the location via [place].  Pt verbalized understanding with no further questions.   Mel Almond, RN 07/19/19

## 2019-07-28 ENCOUNTER — Other Ambulatory Visit: Payer: Self-pay

## 2019-07-28 ENCOUNTER — Ambulatory Visit (HOSPITAL_COMMUNITY)
Admission: RE | Admit: 2019-07-28 | Discharge: 2019-07-28 | Disposition: A | Discharge status: Home / Self Care | Payer: Medicaid Other | Source: Ambulatory Visit | Attending: Obstetrics & Gynecology | Admitting: Obstetrics & Gynecology

## 2019-07-28 DIAGNOSIS — N83209 Unspecified ovarian cyst, unspecified side: Secondary | ICD-10-CM | POA: Diagnosis present

## 2019-07-28 DIAGNOSIS — O3482 Maternal care for other abnormalities of pelvic organs, second trimester: Secondary | ICD-10-CM | POA: Diagnosis present

## 2019-07-28 DIAGNOSIS — D271 Benign neoplasm of left ovary: Secondary | ICD-10-CM | POA: Insufficient documentation

## 2019-07-28 DIAGNOSIS — Z3A00 Weeks of gestation of pregnancy not specified: Secondary | ICD-10-CM | POA: Diagnosis not present

## 2019-07-30 NOTE — Progress Notes (Signed)
Symptomatic large ovarian cysts in the second trimester.  Surgical management recommended.  Patient was called, message left for her to check MyChart since she was unavailable. Will send surgical request to our Practice Administrator to schedule her for surgery:  Exploratory laparotomy, bilateral ovarian cystectomy. Will have a preoperative appointment prior to surgery with patient to discuss surgical details and address any concerns. Patient was informed of these results and recommendations via MyChart.  [name], MD, [place] for [employer], [place]

## 2019-08-02 ENCOUNTER — Telehealth: Payer: Self-pay | Admitting: Family Medicine

## 2019-08-02 ENCOUNTER — Encounter (HOSPITAL_COMMUNITY): Payer: Self-pay | Admitting: Obstetrics & Gynecology

## 2019-08-02 ENCOUNTER — Other Ambulatory Visit (HOSPITAL_COMMUNITY)
Admission: RE | Admit: 2019-08-02 | Discharge: 2019-08-02 | Disposition: A | Discharge status: Home / Self Care | Payer: Medicaid Other | Source: Ambulatory Visit | Attending: Obstetrics & Gynecology | Admitting: Obstetrics & Gynecology

## 2019-08-02 ENCOUNTER — Other Ambulatory Visit: Payer: Self-pay

## 2019-08-02 DIAGNOSIS — Z01812 Encounter for preprocedural laboratory examination: Secondary | ICD-10-CM | POA: Diagnosis present

## 2019-08-02 DIAGNOSIS — Z20822 Contact with and (suspected) exposure to covid-19: Secondary | ICD-10-CM | POA: Diagnosis not present

## 2019-08-02 NOTE — Progress Notes (Signed)
Patient denies shortness of breath, fever, cough and chest pain.  PCP - Denies Cardiologist - denies  Chest x-ray - denies EKG - DOS 08/04/19 Stress Test - denies ECHO - denies Cardiac Cath - denies  Aspirin Instructions: Follow your surgeon's instructions on when to stop aspirin prior to surgery,  If no instructions were given by your surgeon then you will need to call the office for those instructions.  ERAS: Clears til 7:30 am DOS, no drink.  Anesthesia review: Yes, [redacted] wks gestation  STOP now taking any Aspirin (unless otherwise instructed by your surgeon), Aleve, Naproxen, Ibuprofen, Motrin, Advil, Goody's, BC's, all herbal medications, fish oil, and all vitamins.   Coronavirus Screening Results pending on Covid test 08/02/19. Have you experienced the following symptoms:  Cough yes/no: No Fever (>100.51F)  yes/no: No Runny nose yes/no: No Sore throat yes/no: No Difficulty breathing/shortness of breath  yes/no: No  Have you traveled in the last 14 days and where? yes/no: No  Patient verbalized understanding of instructions that were given via phone.

## 2019-08-02 NOTE — Telephone Encounter (Signed)
Faculty Practice OB/GYN Physician Phone Call Documentation  I called Ann Bruce to discuss her MRI results and planned surgery.  Imaging results reviewed, patient does desire surgical intervention.  She was told that her surgery was moved up to 08/04/19 given a cancellation, she is willing to do this at this time.  She was told of need for preoperative COVID testing, and was told she will be contacted about this.  Patient just needs to make plans with her work and family, but feels she will be be able to have surgery on this date 08/04/19. She was told to call the Practice Administrator at [phone] if anything else comes up or if she is unable to proceed with this surgical time and date.   Surgical orders placed.  Other preoperative instructions reviewed with patient.    Ann Schneiders, MD, [place] for [employer], [place]

## 2019-08-02 NOTE — Telephone Encounter (Signed)
Patient called to say she has been getting the run around about her appointment for surgery. She has questions, and would like a call back today.

## 2019-08-03 ENCOUNTER — Ambulatory Visit (HOSPITAL_COMMUNITY): Payer: Medicaid Other | Admitting: *Deleted

## 2019-08-03 ENCOUNTER — Other Ambulatory Visit (HOSPITAL_COMMUNITY): Payer: Self-pay | Admitting: *Deleted

## 2019-08-03 ENCOUNTER — Encounter (HOSPITAL_COMMUNITY): Payer: Self-pay | Admitting: *Deleted

## 2019-08-03 ENCOUNTER — Ambulatory Visit (HOSPITAL_BASED_OUTPATIENT_CLINIC_OR_DEPARTMENT_OTHER)
Admission: RE | Admit: 2019-08-03 | Discharge: 2019-08-03 | Disposition: A | Discharge status: Home / Self Care | Payer: Medicaid Other | Source: Ambulatory Visit | Attending: Obstetrics and Gynecology | Admitting: Obstetrics and Gynecology

## 2019-08-03 DIAGNOSIS — O0990 Supervision of high risk pregnancy, unspecified, unspecified trimester: Secondary | ICD-10-CM | POA: Insufficient documentation

## 2019-08-03 DIAGNOSIS — O2690 Pregnancy related conditions, unspecified, unspecified trimester: Secondary | ICD-10-CM | POA: Diagnosis not present

## 2019-08-03 DIAGNOSIS — Z3A17 17 weeks gestation of pregnancy: Secondary | ICD-10-CM

## 2019-08-03 DIAGNOSIS — O10919 Unspecified pre-existing hypertension complicating pregnancy, unspecified trimester: Secondary | ICD-10-CM | POA: Insufficient documentation

## 2019-08-03 DIAGNOSIS — O99212 Obesity complicating pregnancy, second trimester: Secondary | ICD-10-CM

## 2019-08-03 DIAGNOSIS — Z363 Encounter for antenatal screening for malformations: Secondary | ICD-10-CM

## 2019-08-03 DIAGNOSIS — O169 Unspecified maternal hypertension, unspecified trimester: Secondary | ICD-10-CM | POA: Insufficient documentation

## 2019-08-03 DIAGNOSIS — O09522 Supervision of elderly multigravida, second trimester: Secondary | ICD-10-CM

## 2019-08-03 DIAGNOSIS — O10019 Pre-existing essential hypertension complicating pregnancy, unspecified trimester: Secondary | ICD-10-CM

## 2019-08-03 LAB — SARS CORONAVIRUS 2 (TAT 6-24 HRS): SARS Coronavirus 2: NEGATIVE

## 2019-08-03 NOTE — Progress Notes (Signed)
Anesthesia Chart Review:  Case: [id number] Date/Time: 08/04/19 1015   Procedure: LAPAROTOMY WITH BILATERAL OVARIAN CYSTECTOMY (Bilateral ) - Dr. Harolyn Rutherford will check fetal heart tones before and after surgery   Anesthesia type: Choice   Pre-op diagnosis: Bilateral Ovarian Cyst   Location: [place] [facility] [place] OR   Surgeons: Osborne Oman, MD      DISCUSSION: Patient is a 34 year old female scheduled for the above procedure. She is [redacted] weeks pregnant (17 weeks 4 days on 08/04/19, EDD 01/08/20). Other history includes smoking, HTN, dyspnea with pregnancy. She underwent bilateral ovarian cystectomy for bilateral dermoid cysts ~ 12/25/07 when she was [redacted] weeks pregnant.    Per posting, Dr. Tinnie Gens will check fetal heart tones before and after surgery. Plan for admit to [facility] after surgery. Preoperative COVID-19 test was negative on 08/02/19. She is a same day work-up (surgery was just moved up to 08/04/19 on 08/02/19), so she will get labs on arrival.    VS: Ht 5\' 8"  (1.727 m)   Wt 84.4 kg   LMP 04/03/2019   BMI 28.29 kg/m    PROVIDERS: Patient, No Pcp Per   LABS: She is for labs on the day of surgery. As of 06/24/19, Cr 0.45, AST 13, ALT 14, CBC WNL.    IMAGES: MRI Pelvis 07/28/19: IMPRESSION: 1. Single intrauterine pregnancy. 2. 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 3. 11.2 cm cystic lesion in the right adnexa with probably benign characteristics. Continued follow-up is recommended by pelvic MRI without contrast in 8-12 weeks.   EKG: N/A   CV: N/A  Past Medical History:  Diagnosis Date  . Dyspnea    with pregnancy - exertion  . Hypertension   . Ovarian cyst   . UTI (urinary tract infection)     Past Surgical History:  Procedure Laterality Date  . APPENDECTOMY    . OVARIAN CYST SURGERY  2009  . VAGINAL DELIVERY  2009, 2007   x 2    MEDICATIONS: No current facility-administered medications for this encounter.   Marland Kitchen acetaminophen (TYLENOL) 500 MG tablet  . aspirin 81  MG chewable tablet  . Blood Pressure Monitoring DEVI  . labetalol (NORMODYNE) 200 MG tablet  . metroNIDAZOLE (FLAGYL) 500 MG tablet  . Prenatal Vit-Fe Fumarate-FA (PRENATAL VITAMINS) 28-0.8 MG TABS     Myra Gianotti, PA-C Surgical Short Stay/Anesthesiology [facility] Phone [phone] [facility] Phone [phone] 08/03/2019 12:22 PM

## 2019-08-03 NOTE — Anesthesia Preprocedure Evaluation (Addendum)
Anesthesia Evaluation  Patient identified by MRN, date of birth, ID band Patient awake    Reviewed: Allergy & Precautions, NPO status , Patient's Chart, lab work & pertinent test results  History of Anesthesia Complications Negative for: history of anesthetic complications  Airway Mallampati: II  TM Distance: >3 FB Neck ROM: Full    Dental  (+) Teeth Intact   Pulmonary Current Smoker and Patient abstained from smoking.,    Pulmonary exam normal        Cardiovascular hypertension, Normal cardiovascular exam     Neuro/Psych negative neurological ROS  negative psych ROS   GI/Hepatic negative GI ROS, Neg liver ROS,   Endo/Other  negative endocrine ROS  Renal/GU negative Renal ROS  negative genitourinary   Musculoskeletal negative musculoskeletal ROS (+)   Abdominal   Peds  Hematology negative hematology ROS (+)   Anesthesia Other Findings   Reproductive/Obstetrics (+) Pregnancy (17wk)                           Anesthesia Physical Anesthesia Plan  ASA: II  Anesthesia Plan: General   Post-op Pain Management: GA combined w/ Regional for post-op pain   Induction: Intravenous  PONV Risk Score and Plan: 2 and Ondansetron, Dexamethasone, Treatment may vary due to age or medical condition and Midazolam  Airway Management Planned: Oral ETT  Additional Equipment: None  Intra-op Plan:   Post-operative Plan: Extubation in OR  Informed Consent: I have reviewed the patients History and Physical, chart, labs and discussed the procedure including the risks, benefits and alternatives for the proposed anesthesia with the patient or authorized representative who has indicated his/her understanding and acceptance.     Dental advisory given  Plan Discussed with:   Anesthesia Plan Comments: (PAT note written 08/03/2019 by Myra Gianotti, PA-C.  She is [redacted] weeks pregnant (17 weeks 4 days on  08/04/19, EDD 01/08/20). Per posting, Dr. Tinnie Gens will check fetal heart tones before and after surgery. Plan for admit to [facility] after surgery.)      Anesthesia Quick Evaluation

## 2019-08-04 ENCOUNTER — Inpatient Hospital Stay (HOSPITAL_COMMUNITY): Payer: Medicaid Other

## 2019-08-04 ENCOUNTER — Inpatient Hospital Stay (HOSPITAL_COMMUNITY)
Admission: RE | Admit: 2019-08-04 | Discharge: 2019-08-06 | DRG: 818 | Disposition: A | Discharge status: Home / Self Care | Payer: Medicaid Other | Attending: Obstetrics & Gynecology | Admitting: Obstetrics & Gynecology

## 2019-08-04 ENCOUNTER — Encounter (HOSPITAL_COMMUNITY)
Admission: RE | Disposition: A | Discharge status: Home / Self Care | Payer: Self-pay | Source: Home / Self Care | Attending: Obstetrics & Gynecology

## 2019-08-04 ENCOUNTER — Other Ambulatory Visit: Payer: Self-pay

## 2019-08-04 ENCOUNTER — Encounter (HOSPITAL_COMMUNITY): Payer: Self-pay | Admitting: Obstetrics & Gynecology

## 2019-08-04 DIAGNOSIS — O162 Unspecified maternal hypertension, second trimester: Secondary | ICD-10-CM | POA: Diagnosis not present

## 2019-08-04 DIAGNOSIS — D270 Benign neoplasm of right ovary: Secondary | ICD-10-CM | POA: Diagnosis not present

## 2019-08-04 DIAGNOSIS — N83201 Unspecified ovarian cyst, right side: Secondary | ICD-10-CM

## 2019-08-04 DIAGNOSIS — N83202 Unspecified ovarian cyst, left side: Secondary | ICD-10-CM

## 2019-08-04 DIAGNOSIS — O99332 Smoking (tobacco) complicating pregnancy, second trimester: Secondary | ICD-10-CM | POA: Diagnosis present

## 2019-08-04 DIAGNOSIS — O99891 Other specified diseases and conditions complicating pregnancy: Secondary | ICD-10-CM

## 2019-08-04 DIAGNOSIS — O3482 Maternal care for other abnormalities of pelvic organs, second trimester: Secondary | ICD-10-CM | POA: Diagnosis not present

## 2019-08-04 DIAGNOSIS — N83291 Other ovarian cyst, right side: Secondary | ICD-10-CM

## 2019-08-04 DIAGNOSIS — Z20822 Contact with and (suspected) exposure to covid-19: Secondary | ICD-10-CM | POA: Diagnosis not present

## 2019-08-04 DIAGNOSIS — F17210 Nicotine dependence, cigarettes, uncomplicated: Secondary | ICD-10-CM | POA: Diagnosis present

## 2019-08-04 DIAGNOSIS — D271 Benign neoplasm of left ovary: Secondary | ICD-10-CM | POA: Diagnosis not present

## 2019-08-04 DIAGNOSIS — Z3A17 17 weeks gestation of pregnancy: Secondary | ICD-10-CM

## 2019-08-04 DIAGNOSIS — N83209 Unspecified ovarian cyst, unspecified side: Secondary | ICD-10-CM | POA: Diagnosis present

## 2019-08-04 HISTORY — DX: Dyspnea, unspecified: R06.00

## 2019-08-04 HISTORY — PX: LAPAROTOMY: SHX154

## 2019-08-04 LAB — BASIC METABOLIC PANEL
Anion gap: 10 (ref 5–15)
BUN: 6 mg/dL (ref 6–20)
CO2: 20 mmol/L — ABNORMAL LOW (ref 22–32)
Calcium: 8.5 mg/dL — ABNORMAL LOW (ref 8.9–10.3)
Chloride: 106 mmol/L (ref 98–111)
Creatinine, Ser: 0.41 mg/dL — ABNORMAL LOW (ref 0.44–1.00)
GFR calc Af Amer: >60 mL/min (ref >60)
GFR calc non Af Amer: >60 mL/min (ref >60)
Glucose, Bld: 83 mg/dL (ref 70–99)
Potassium: 3.6 mmol/L (ref 3.5–5.1)
Sodium: 136 mmol/L (ref 135–145)

## 2019-08-04 LAB — CBC
HCT: 36 % (ref 36.0–46.0)
Hemoglobin: 11.6 g/dL — ABNORMAL LOW (ref 12.0–15.0)
MCH: 31.1 pg (ref 26.0–34.0)
MCHC: 32.2 g/dL (ref 30.0–36.0)
MCV: 96.5 fL (ref 80.0–100.0)
Platelets: 231 10*3/uL (ref 150–400)
RBC: 3.73 MIL/uL — ABNORMAL LOW (ref 3.87–5.11)
RDW: 12.8 % (ref 11.5–15.5)
WBC: 5.4 10*3/uL (ref 4.0–10.5)
nRBC: 0 % (ref 0.0–0.2)

## 2019-08-04 LAB — TYPE AND SCREEN
ABO/RH(D): O POS
Antibody Screen: NEGATIVE

## 2019-08-04 SURGERY — LAPAROTOMY
Anesthesia: General | Site: Abdomen | Laterality: Bilateral

## 2019-08-04 MED ORDER — PROPOFOL 10 MG/ML IV BOLUS
INTRAVENOUS | Status: DC | PRN
  Administered 2019-08-04: 160 mg via INTRAVENOUS

## 2019-08-04 MED ORDER — FENTANYL CITRATE (PF) 250 MCG/5ML IJ SOLN
INTRAMUSCULAR | Status: AC
  Filled 2019-08-04: qty 5

## 2019-08-04 MED ORDER — OXYCODONE HCL 5 MG PO TABS: 5.0000 mg | ORAL_TABLET | Freq: Once | ORAL | Status: DC | PRN

## 2019-08-04 MED ORDER — KETOROLAC TROMETHAMINE 30 MG/ML IJ SOLN
INTRAMUSCULAR | Status: DC | PRN
  Administered 2019-08-04: 30 mg via INTRAVENOUS

## 2019-08-04 MED ORDER — OXYCODONE HCL 5 MG/5ML PO SOLN: 5.0000 mg | Freq: Once | ORAL | Status: DC | PRN

## 2019-08-04 MED ORDER — ASPIRIN 81 MG PO CHEW
81.0000 mg | CHEWABLE_TABLET | Freq: Every day | ORAL | Status: DC
  Administered 2019-08-05 – 2019-08-06 (×2): 81 mg via ORAL
  Filled 2019-08-04 (×2): qty 1

## 2019-08-04 MED ORDER — OXYCODONE HCL 5 MG PO TABS
5.0000 mg | ORAL_TABLET | ORAL | Status: DC | PRN
  Administered 2019-08-04 – 2019-08-05 (×3): 10 mg via ORAL
  Filled 2019-08-04 (×3): qty 2

## 2019-08-04 MED ORDER — DEXAMETHASONE SODIUM PHOSPHATE 10 MG/ML IJ SOLN
INTRAMUSCULAR | Status: DC | PRN
  Administered 2019-08-04: 5 mg via INTRAVENOUS

## 2019-08-04 MED ORDER — DIPHENHYDRAMINE HCL 25 MG PO CAPS: 25.0000 mg | ORAL_CAPSULE | Freq: Four times a day (QID) | ORAL | Status: DC | PRN

## 2019-08-04 MED ORDER — KETOROLAC TROMETHAMINE 30 MG/ML IJ SOLN
30.0000 mg | Freq: Four times a day (QID) | INTRAMUSCULAR | Status: AC
  Administered 2019-08-04 – 2019-08-05 (×3): 30 mg via INTRAVENOUS
  Filled 2019-08-04 (×3): qty 1

## 2019-08-04 MED ORDER — ESMOLOL HCL 100 MG/10ML IV SOLN
INTRAVENOUS | Status: DC | PRN
  Administered 2019-08-04: 20 mg via INTRAVENOUS

## 2019-08-04 MED ORDER — ENOXAPARIN SODIUM 40 MG/0.4ML ~~LOC~~ SOLN
40.0000 mg | SUBCUTANEOUS | Status: DC
  Administered 2019-08-05: 40 mg via SUBCUTANEOUS
  Filled 2019-08-04: qty 0.4

## 2019-08-04 MED ORDER — FENTANYL CITRATE (PF) 100 MCG/2ML IJ SOLN
INTRAMUSCULAR | Status: AC
  Filled 2019-08-04: qty 2

## 2019-08-04 MED ORDER — MENTHOL 3 MG MT LOZG: 1.0000 | LOZENGE | OROMUCOSAL | Status: DC | PRN

## 2019-08-04 MED ORDER — OXYCODONE-ACETAMINOPHEN 5-325 MG PO TABS
2.0000 | ORAL_TABLET | ORAL | Status: DC | PRN
  Administered 2019-08-05 – 2019-08-06 (×3): 2 via ORAL
  Filled 2019-08-04 (×3): qty 2

## 2019-08-04 MED ORDER — SODIUM CHLORIDE 0.9 % IV SOLN
2.0000 g | INTRAVENOUS | Status: AC
  Administered 2019-08-04: 2 g via INTRAVENOUS

## 2019-08-04 MED ORDER — LABETALOL HCL 200 MG PO TABS
200.0000 mg | ORAL_TABLET | Freq: Two times a day (BID) | ORAL | Status: DC
  Filled 2019-08-04 (×2): qty 1

## 2019-08-04 MED ORDER — ROCURONIUM BROMIDE 100 MG/10ML IV SOLN
INTRAVENOUS | Status: DC | PRN
  Administered 2019-08-04: 10 mg via INTRAVENOUS
  Administered 2019-08-04: 50 mg via INTRAVENOUS

## 2019-08-04 MED ORDER — FENTANYL CITRATE (PF) 100 MCG/2ML IJ SOLN
25.0000 ug | INTRAMUSCULAR | Status: DC | PRN
  Administered 2019-08-04 (×2): 50 ug via INTRAVENOUS

## 2019-08-04 MED ORDER — ACETAMINOPHEN 500 MG PO TABS: 1000.0000 mg | ORAL_TABLET | ORAL | Status: AC

## 2019-08-04 MED ORDER — FENTANYL CITRATE (PF) 100 MCG/2ML IJ SOLN
INTRAMUSCULAR | Status: AC
  Administered 2019-08-04: 100 ug via INTRAVENOUS
  Filled 2019-08-04: qty 2

## 2019-08-04 MED ORDER — SIMETHICONE 80 MG PO CHEW
80.0000 mg | CHEWABLE_TABLET | ORAL | Status: DC
  Administered 2019-08-04 – 2019-08-05 (×2): 80 mg via ORAL
  Filled 2019-08-04 (×2): qty 1

## 2019-08-04 MED ORDER — LACTATED RINGERS IV SOLN: INTRAVENOUS | Status: DC

## 2019-08-04 MED ORDER — FENTANYL CITRATE (PF) 100 MCG/2ML IJ SOLN: 100.0000 ug | Freq: Once | INTRAMUSCULAR | Status: AC

## 2019-08-04 MED ORDER — SODIUM CHLORIDE 0.9 % IV SOLN
INTRAVENOUS | Status: AC
  Filled 2019-08-04: qty 2

## 2019-08-04 MED ORDER — IBUPROFEN 800 MG PO TABS
800.0000 mg | ORAL_TABLET | Freq: Three times a day (TID) | ORAL | Status: AC
  Administered 2019-08-05 – 2019-08-06 (×3): 800 mg via ORAL
  Filled 2019-08-04 (×3): qty 1

## 2019-08-04 MED ORDER — NEOSTIGMINE METHYLSULFATE 10 MG/10ML IV SOLN
INTRAVENOUS | Status: DC | PRN
  Administered 2019-08-04: 4 mg via INTRAVENOUS

## 2019-08-04 MED ORDER — ACETAMINOPHEN 500 MG PO TABS
ORAL_TABLET | ORAL | Status: AC
  Administered 2019-08-04: 1000 mg via ORAL
  Filled 2019-08-04: qty 2

## 2019-08-04 MED ORDER — ONDANSETRON HCL 4 MG/2ML IJ SOLN
INTRAMUSCULAR | Status: DC | PRN
  Administered 2019-08-04 (×2): 4 mg via INTRAVENOUS

## 2019-08-04 MED ORDER — SIMETHICONE 80 MG PO CHEW: 80.0000 mg | CHEWABLE_TABLET | ORAL | Status: DC | PRN

## 2019-08-04 MED ORDER — SENNOSIDES-DOCUSATE SODIUM 8.6-50 MG PO TABS
2.0000 | ORAL_TABLET | ORAL | Status: DC
  Administered 2019-08-04 – 2019-08-05 (×2): 2 via ORAL
  Filled 2019-08-04 (×2): qty 2

## 2019-08-04 MED ORDER — FENTANYL CITRATE (PF) 100 MCG/2ML IJ SOLN
INTRAMUSCULAR | Status: DC | PRN
  Administered 2019-08-04 (×3): 50 ug via INTRAVENOUS
  Administered 2019-08-04: 100 ug via INTRAVENOUS

## 2019-08-04 MED ORDER — SODIUM CHLORIDE 0.9 % IR SOLN
Status: DC | PRN
  Administered 2019-08-04: 2000 mL

## 2019-08-04 MED ORDER — HYDROMORPHONE HCL 1 MG/ML IJ SOLN
1.0000 mg | INTRAMUSCULAR | Status: DC | PRN
  Administered 2019-08-04 (×2): 1 mg via INTRAVENOUS
  Filled 2019-08-04 (×2): qty 1

## 2019-08-04 MED ORDER — BUPIVACAINE HCL (PF) 0.5 % IJ SOLN
INTRAMUSCULAR | Status: AC
  Filled 2019-08-04: qty 30

## 2019-08-04 MED ORDER — IPRATROPIUM-ALBUTEROL 0.5-2.5 (3) MG/3ML IN SOLN
RESPIRATORY_TRACT | Status: AC
  Filled 2019-08-04: qty 3

## 2019-08-04 MED ORDER — ONDANSETRON HCL 4 MG/2ML IJ SOLN: 4.0000 mg | Freq: Once | INTRAMUSCULAR | Status: DC | PRN

## 2019-08-04 MED ORDER — HEMOSTATIC AGENTS (NO CHARGE) OPTIME
TOPICAL | Status: DC | PRN
  Administered 2019-08-04: 1 via TOPICAL

## 2019-08-04 MED ORDER — SUCCINYLCHOLINE CHLORIDE 200 MG/10ML IV SOSY
PREFILLED_SYRINGE | INTRAVENOUS | Status: DC | PRN
  Administered 2019-08-04: 100 mg via INTRAVENOUS

## 2019-08-04 MED ORDER — GLYCOPYRROLATE 0.2 MG/ML IJ SOLN
INTRAMUSCULAR | Status: DC | PRN
  Administered 2019-08-04: .6 mg via INTRAVENOUS

## 2019-08-04 MED ORDER — BUPIVACAINE HCL (PF) 0.25 % IJ SOLN
INTRAMUSCULAR | Status: DC | PRN
  Administered 2019-08-04 (×2): 30 mL via EPIDURAL

## 2019-08-04 MED ORDER — TRAMADOL HCL 50 MG PO TABS: 50.0000 mg | ORAL_TABLET | Freq: Four times a day (QID) | ORAL | Status: DC | PRN

## 2019-08-04 MED ORDER — PRENATAL MULTIVITAMIN CH
1.0000 | ORAL_TABLET | Freq: Every day | ORAL | Status: DC
  Administered 2019-08-05: 1 via ORAL
  Filled 2019-08-04: qty 1

## 2019-08-04 MED ORDER — MIDAZOLAM HCL 2 MG/2ML IJ SOLN: 2.0000 mg | Freq: Once | INTRAMUSCULAR | Status: AC

## 2019-08-04 MED ORDER — PHENYLEPHRINE 40 MCG/ML (10ML) SYRINGE FOR IV PUSH (FOR BLOOD PRESSURE SUPPORT)
PREFILLED_SYRINGE | INTRAVENOUS | Status: DC | PRN
  Administered 2019-08-04: 80 ug via INTRAVENOUS
  Administered 2019-08-04: 40 ug via INTRAVENOUS
  Administered 2019-08-04 (×2): 80 ug via INTRAVENOUS

## 2019-08-04 MED ORDER — PROPOFOL 10 MG/ML IV BOLUS
INTRAVENOUS | Status: AC
  Filled 2019-08-04: qty 20

## 2019-08-04 MED ORDER — SOD CITRATE-CITRIC ACID 500-334 MG/5ML PO SOLN
30.0000 mL | ORAL | Status: DC
  Filled 2019-08-04: qty 30

## 2019-08-04 MED ORDER — MIDAZOLAM HCL 2 MG/2ML IJ SOLN
INTRAMUSCULAR | Status: AC
  Administered 2019-08-04: 2 mg via INTRAVENOUS
  Filled 2019-08-04: qty 2

## 2019-08-04 SURGICAL SUPPLY — 47 items
APL SKNCLS STERI-STRIP NONHPOA (GAUZE/BANDAGES/DRESSINGS) ×1
BENZOIN TINCTURE PRP APPL 2/3 (GAUZE/BANDAGES/DRESSINGS) ×2 IMPLANT
CANISTER SUCT 3000ML PPV (MISCELLANEOUS) ×2 IMPLANT
CELLS DAT CNTRL 66122 CELL SVR (MISCELLANEOUS) IMPLANT
COVER WAND RF STERILE (DRAPES) ×2 IMPLANT
DECANTER SPIKE VIAL GLASS SM (MISCELLANEOUS) IMPLANT
DRAPE WARM FLUID 44X44 (DRAPES) IMPLANT
DRSG OPSITE POSTOP 4X10 (GAUZE/BANDAGES/DRESSINGS) ×2 IMPLANT
DURAPREP 26ML APPLICATOR (WOUND CARE) ×2 IMPLANT
GAUZE 4X4 16PLY RFD (DISPOSABLE) ×2 IMPLANT
GAUZE SPONGE 4X4 12PLY STRL (GAUZE/BANDAGES/DRESSINGS) ×1 IMPLANT
GAUZE SPONGE 4X4 12PLY STRL LF (GAUZE/BANDAGES/DRESSINGS) ×2 IMPLANT
GLOVE BIOGEL PI IND STRL 7.0 (GLOVE) ×3 IMPLANT
GLOVE BIOGEL PI INDICATOR 7.0 (GLOVE) ×3
GLOVE ECLIPSE 7.0 STRL STRAW (GLOVE) ×2 IMPLANT
GOWN STRL REUS W/ TWL LRG LVL3 (GOWN DISPOSABLE) ×3 IMPLANT
GOWN STRL REUS W/TWL LRG LVL3 (GOWN DISPOSABLE) ×6
KIT TURNOVER KIT B (KITS) ×2 IMPLANT
NEEDLE HYPO 22GX1.5 SAFETY (NEEDLE) ×2 IMPLANT
NS IRRIG 1000ML POUR BTL (IV SOLUTION) ×2 IMPLANT
PACK ABDOMINAL GYN (CUSTOM PROCEDURE TRAY) ×2 IMPLANT
PAD ABD 8X10 STRL (GAUZE/BANDAGES/DRESSINGS) ×1 IMPLANT
PAD OB MATERNITY 4.3X12.25 (PERSONAL CARE ITEMS) ×2 IMPLANT
RETRACTOR WND ALEXIS 18 MED (MISCELLANEOUS) IMPLANT
RTRCTR WOUND ALEXIS 18CM MED (MISCELLANEOUS)
RTRCTR WOUND ALEXIS 18CM SML (INSTRUMENTS) ×4
SAVER CELL AAL HAEMONETICS (INSTRUMENTS) IMPLANT
SPECIMEN JAR MEDIUM (MISCELLANEOUS) ×2 IMPLANT
SPONGE LAP 18X18 RF (DISPOSABLE) ×4 IMPLANT
STRIP CLOSURE SKIN 1/2X4 (GAUZE/BANDAGES/DRESSINGS) ×2 IMPLANT
SUT PDS AB 0 CTX 60 (SUTURE) IMPLANT
SUT PLAIN 2 0 (SUTURE)
SUT PLAIN ABS 2-0 CT1 27XMFL (SUTURE) IMPLANT
SUT VIC AB 0 CT1 18XCR BRD8 (SUTURE) IMPLANT
SUT VIC AB 0 CT1 27 (SUTURE) ×2
SUT VIC AB 0 CT1 27XBRD ANBCTR (SUTURE) ×1 IMPLANT
SUT VIC AB 0 CT1 8-18 (SUTURE)
SUT VIC AB 0 CTX 36 (SUTURE)
SUT VIC AB 0 CTX36XBRD ANBCTRL (SUTURE) IMPLANT
SUT VIC AB 3-0 SH 27 (SUTURE) ×2
SUT VIC AB 3-0 SH 27XBRD (SUTURE) IMPLANT
SUT VIC AB 4-0 KS 27 (SUTURE) ×2 IMPLANT
SUT VICRYL 0 TIES 12 18 (SUTURE) IMPLANT
SYR CONTROL 10ML LL (SYRINGE) ×2 IMPLANT
TAPE PAPER 3X10 WHT MICROPORE (GAUZE/BANDAGES/DRESSINGS) ×1 IMPLANT
TOWEL GREEN STERILE FF (TOWEL DISPOSABLE) ×4 IMPLANT
TRAY FOLEY W/BAG SLVR 14FR (SET/KITS/TRAYS/PACK) ×2 IMPLANT

## 2019-08-04 NOTE — Anesthesia Procedure Notes (Signed)
Anesthesia Regional Block: TAP block   Pre-Anesthetic Checklist: ,, timeout performed, Correct Patient, Correct Site, Correct Laterality, Correct Procedure, Correct Position, site marked, Risks and benefits discussed,  Surgical consent,  Pre-op evaluation,  At surgeon's request and post-op pain management  Laterality: Left  Prep: chloraprep       Needles:  Injection technique: Single-shot  Needle Type: Echogenic Stimulator Needle     Needle Length: 10cm  Needle Gauge: 21     Additional Needles:   Procedures:,,,, ultrasound used (permanent image in chart),,,,  Narrative:  Start time: 08/04/2019 10:10 AM End time: 08/04/2019 10:13 AM Injection made incrementally with aspirations every 5 mL.  Performed by: Personally  Anesthesiologist: Lidia Collum, MD  Additional Notes: Monitors applied. Injection made in 5cc increments. No resistance to injection. Good needle visualization. Patient tolerated procedure well.

## 2019-08-04 NOTE — Anesthesia Postprocedure Evaluation (Signed)
Anesthesia Post Note  Patient: Ann Bruce  Procedure(s) Performed: EXPLORATORY LAPAROTOMY WITH BILATERAL OVARIAN CYSTECTOMY (Bilateral Abdomen)     Patient location during evaluation: PACU Anesthesia Type: General Level of consciousness: awake and alert Pain management: pain level controlled Vital Signs Assessment: post-procedure vital signs reviewed and stable Respiratory status: spontaneous breathing, nonlabored ventilation, respiratory function stable and patient connected to nasal cannula oxygen Cardiovascular status: blood pressure returned to baseline and stable Postop Assessment: no apparent nausea or vomiting Anesthetic complications: no    Last Vitals:  Vitals:   08/04/19 1247 08/04/19 1317  BP: 107/66 100/62  Pulse: 66 (!) 57  Resp: 16 11  Temp:    SpO2: 100% 100%    Last Pain:  Vitals:   08/04/19 1026  TempSrc:   PainSc: 0-No pain                 Ann Bruce DAVID

## 2019-08-04 NOTE — Transfer of Care (Signed)
Immediate Anesthesia Transfer of Care Note  Patient: Ann Bruce  Procedure(s) Performed: EXPLORATORY LAPAROTOMY WITH BILATERAL OVARIAN CYSTECTOMY (Bilateral Abdomen)  Patient Location: PACU  Anesthesia Type:General and Regional  Level of Consciousness: drowsy and patient cooperative  Airway & Oxygen Therapy: Patient Spontanous Breathing and Patient connected to face mask oxygen  Post-op Assessment: Report given to RN and Post -op Vital signs reviewed and stable  Post vital signs: Reviewed and stable  Last Vitals:  Vitals Value Taken Time  BP 113/74 08/04/19 1218  Temp    Pulse 75 08/04/19 1221  Resp 16 08/04/19 1221  SpO2 96 % 08/04/19 1221  Vitals shown include unvalidated device data.  Last Pain:  Vitals:   08/04/19 1026  TempSrc:   PainSc: 0-No pain      Patients Stated Pain Goal: 0 ([id number])  Complications: No apparent anesthesia complications

## 2019-08-04 NOTE — Progress Notes (Signed)
Went to obtain FHT's after procedure but pt is tachypnea and having difficulty breathing.  Pt is sitting straight up in high fowler's and Anesthesia is at bedside for assessment.   Abdominal dressing is obscuring area to monitor. Will return when pt is feeling better.

## 2019-08-04 NOTE — Anesthesia Procedure Notes (Signed)
Anesthesia Regional Block: TAP block   Pre-Anesthetic Checklist: ,, timeout performed, Correct Patient, Correct Site, Correct Laterality, Correct Procedure, Correct Position, site marked, Risks and benefits discussed,  Surgical consent,  Pre-op evaluation,  At surgeon's request and post-op pain management  Laterality: Right  Prep: chloraprep       Needles:  Injection technique: Single-shot  Needle Type: Echogenic Stimulator Needle     Needle Length: 10cm  Needle Gauge: 21     Additional Needles:   Procedures:,,,, ultrasound used (permanent image in chart),,,,  Narrative:  Start time: 08/04/2019 10:13 AM End time: 08/04/2019 10:15 AM Injection made incrementally with aspirations every 5 mL.  Performed by: Personally  Anesthesiologist: Lidia Collum, MD  Additional Notes: Monitors applied. Injection made in 5cc increments. No resistance to injection. Good needle visualization. Patient tolerated procedure well.

## 2019-08-04 NOTE — Discharge Instructions (Signed)
Exploratory Laparotomy, Adult, Care After °This sheet gives you information about how to care for yourself after your procedure. Your health care provider may also give you more specific instructions. If you have problems or questions, contact your health care provider. °What can I expect after the procedure? °After the procedure, it is common to have: °· Abdominal soreness. °· Fatigue. °· A sore throat from the tube in your throat. °· A lack of appetite. °Follow these instructions at home: °Medicines °· Take over-the-counter and prescription medicines only as told by your health care provider. °· If you were prescribed an antibiotic medicine, take it as told by your health care provider. Do not stop taking the antibiotic even if you start to feel better. °· Do not drive or operate heavy machinery while taking pain medicine. °· If you are taking prescription pain medicine, take actions to prevent or treat constipation. Your health care provider may recommend that you: °? Drink enough fluid to keep your urine pale yellow. °? Eat foods that are high in fiber, such as fresh fruits and vegetables, whole grains, and beans. °? Limit foods that are high in fat and processed sugars, such as fried or sweet foods. °? Take an over-the-counter or prescription medicine for constipation. Undergoing surgery and taking pain medicines can make constipation worse. °Incision care ° °· Follow instructions from your health care provider about how to take care of your incision. Make sure you: °? Wash your hands with soap and water before you change your bandage (dressing). If soap and water are not available, use hand sanitizer. °? Change your dressing as told by your health care provider. °? Leave stitches (sutures), skin glue, or adhesive strips in place. These skin closures may need to stay in place for 2 weeks or longer. If adhesive strip edges start to loosen and curl up, you may trim the loose edges. Do not remove adhesive strips  completely unless your health care provider tells you to do that. °· If you were sent home with a drain, follow instructions from your health care provider about how to care for it. °· Check your incision area every day for signs of infection. Check for: °? Redness, swelling, or pain. °? Fluid or blood. °? Warmth. °? Pus or a bad smell. °Activity ° °· Rest as told by your health care provider. °? Avoid sitting for a long time without moving. Get up to take short walks every 1-2 hours. This is important to improve blood flow and breathing. Ask for help if you feel weak or unsteady. °· Do not lift anything that is heavier than 5 lb (2.2 kg), or the limit that your health care provider tells you, until he or she says that it is safe. °· Ask your health care provider when you can start to do your usual activities again, such as driving, going back to work, and having sex. °Eating and drinking °· You may eat what you usually eat. Include lots of whole grains, fruits, and vegetables in your diet. This will help to prevent constipation. °· Drink enough fluid to keep your urine pale yellow. °Bathing °· Keep your incision clean and dry. Clean it as often as told by your health care provider: °? Gently wash the incision with soap and water. °? Rinse the incision with water to remove all soap. °? Pat the incision dry with a clean towel. Do not rub the incision. °· You may take showers after 48 hours. °· Do not take baths,   swim, or use a hot tub until your health care provider says it is okay to do so. °General instructions °· Do not use any products that contain nicotine or tobacco, such as cigarettes and e-cigarettes. These can delay healing after surgery. If you need help quitting, ask your health care provider. °· Wear compression stockings as told by your health care provider. These stockings help to prevent blood clots and reduce swelling in your legs. °· Keep all follow-up visits as told by your health care provider.  This is important. °Contact a health care provider if: °· You have a fever. °· You have chills. °· Your pain medicine is not helping. °· You have constipation or diarrhea. °· You have nausea or vomiting. °· You have drainage, redness, swelling, or pain at your incision site. °Get help right away if: °· Your pain is getting worse. °· You have not had a bowel movement for more than 3 days. °· You have ongoing (persistent) vomiting. °· The edges of your incision open up. °· You have warmth, tenderness, and swelling in your calf. °· You have trouble breathing. °· You have chest pain. °These symptoms may represent a serious problem that is an emergency. Do not wait to see if the symptoms will go away. Get medical help right away. Call your local emergency services (911 in the [place]). Do not drive yourself to the hospital. °Summary °· Abdominal soreness is common after exploratory laparotomy. Take over-the-counter and prescription pain medicines only as told by your health care provider. °· Follow instructions from your health care provider about how to take care of your incision. Do not take baths, swim, or use a hot tub until your health care provider says it is okay to do so. °· Watch for signs and symptoms of infection after surgery, including fever, chills, drainage from your incision, and worsening abdominal pain. °This information is not intended to replace advice given to you by your health care provider. Make sure you discuss any questions you have with your health care provider. °Document Revised: 07/06/2018 Document Reviewed: 05/23/2017 °Elsevier Patient Education © 2020 [employer] ° °

## 2019-08-04 NOTE — Anesthesia Procedure Notes (Signed)
Procedure Name: Intubation Date/Time: 08/04/2019 10:37 AM Performed by: Gwyndolyn Saxon, CRNA Pre-anesthesia Checklist: Patient identified, Emergency Drugs available, Suction available and Patient being monitored Patient Re-evaluated:Patient Re-evaluated prior to induction Oxygen Delivery Method: Circle system utilized Preoxygenation: Pre-oxygenation with 100% oxygen Induction Type: IV induction, Rapid sequence and Cricoid Pressure applied Laryngoscope Size: Miller and 2 Grade View: Grade I Tube type: Oral Tube size: 7.0 mm Number of attempts: 1 Airway Equipment and Method: Patient positioned with wedge pillow and Stylet Placement Confirmation: ETT inserted through vocal cords under direct vision,  positive ETCO2 and breath sounds checked- equal and bilateral Secured at: 21 cm Tube secured with: Tape Dental Injury: Teeth and Oropharynx as per pre-operative assessment

## 2019-08-04 NOTE — Op Note (Signed)
PROCEDURE DATE:08/04/2019  PREOPERATIVE DIAGNOSES:  Symptomatic large bilateral ovarian cysts, [redacted] weeks pregnant POSTOPERATIVE DIAGNOSES:  The same OPERATION:  Exploratory laparotomy,  Bilateral Ovarian Cystectomy SURGEON:   [name], M.D. ASSISTANT: Arlina Robes, M.D. An experienced assistant was required given the standard of surgical care given the complexity of the case.  This assistant was needed for exposure, dissection, suctioning, retraction, instrument exchange, and for overall help during the procedure. ANESTHESIA:  General endotracheal and TAP block. ANESTHESIOLOGY TEAM: Anesthesiologist: Lillia Abed, MD; Lidia Collum, MD CRNA: Gwyndolyn Saxon, CRNA  INDICATIONS: The patient is a 34 y.o. [phone] at [redacted]w[redacted]d with symptomatic bilateral large ovarian cysts who desired surgical management. On the preoperative visit, the risks, benefits, indications, and alternatives of the procedure were reviewed with the patient.  On the day of surgery, the risks of surgery were again discussed with the patient including but not limited to: bleeding which may require transfusion or reoperation; infection which may require antibiotics; injury to bowel, bladder, ureters or other surrounding organs; need for additional procedures; thromboembolic phenomenon, incisional problems and other postoperative/anesthesia complications. Written informed consent was obtained.    OPERATIVE FINDINGS: A 18 week size uterus with normal bilateral fallopian tubes. Large bilateral ovarian cysts seen; about 11 cm simple appearing cyst on right and 6 cm dermoid cyst on left. A smaller 2 cm dermoid cyst also seen on left.  No other anomalies noted in pelvis.    ESTIMATED BLOOD LOSS: 50 ml SPECIMENS:  Right and left ovarian cysts sent to pathology COMPLICATIONS:  None immediate.  DESCRIPTION OF PROCEDURE:  The patient received intravenous antibiotics and had sequential compression devices applied to her lower  extremities while in the preoperative area.   She was taken to the operating room and placed under general anesthesia without difficulty.The abdomen and perineum were prepped and draped in a sterile manner, and she was placed in a dorsal supine position.  A Foley catheter was inserted into the bladder and attached to constant drainage. After an adequate timeout was performed, a small (6 cm) Pfannensteil skin incision was made. This incision was taken down to the fascia using electrocautery with care given to maintain good hemostasis. The fascia was incised in the midline and the fascial incision was then extended bilaterally using electrocautery without difficulty. The fascia was then dissected off the underlying rectus muscles using blunt and sharp dissection. The rectus muscles were split bluntly in the midline and the peritoneum entered sharply without complication. This peritoneal incision was then extended superiorly and inferiorly with care given to prevent bowel or bladder injury.  Attention was then turned to the pelvis. A small Alexis retractor was placed. The right ovary at this point was noted to be mobilized and was delivered up out of the abdomen. An incison was made over the right ovarian cyst, and it was drained for copious amount of clear fluid. The cyst wall was then separated from the ovarian tissue with sharp and blunt dissection; entire cyst was successfully removed. There was mild amount of bleeding at the base, controlled with electrocautery.  The right ovarian tissue was then reapproximated with 3-0 Vicryl stitches. The ovary was then wrapped in Surgicel and returned to abdomen.  Attention was then turned to the left ovary which was also delivered to the surface. Similarly, an incision was made over the large dermoid cyst and this was excised in the same manner from the ovary.  A smaller dermoid cyst was noted at the base, and it was  removed in a similar fashion. There was no spillage of  dermoid cyst content into abdomen or pelvis. There was more bleeding from the base of the cyst on the left controlled with electrocautery, and the ovarian tissue was then reapproximated with 3-0 Vicryl stitches.  It was then wrapped in Surgicel and returned to abdomen. The pelvis was irrigated and hemostasis was reconfirmed at all pedicles.  The retractor, all sponges and instruments were removed from the abdomen. The peritoneum was closed with a 3-0 Vicryl running stitch, and the fascia was also closed in a running fashion with 0 Vicryl suture. The skin was closed with a 4-0 Vicryl subcuticular stitch. Sponge, lap, needle, and instrument counts were correct times three. The patient was taken to the recovery area awake, extubated and in stable condition. Of note, there were reassuring FHR checks before and after surgery,   [name], MD, [place], [facility] for [employer], [place]

## 2019-08-04 NOTE — H&P (Signed)
Preoperative History and Physical  Ann Bruce is a 34 y.o. [id number] at [redacted]w[redacted]d here for surgical management of symptomatic large bilateral ovarian cysts.   No significant preoperative concerns.  Proposed surgery: Exploratory Laparotomy, Bilateral Ovarian Cystectomy  Past Medical History:  Diagnosis Date  . Dyspnea    with pregnancy - exertion  . Hypertension   . Ovarian cyst   . UTI (urinary tract infection)    Past Surgical History:  Procedure Laterality Date  . APPENDECTOMY    . OVARIAN CYST SURGERY  2009  . VAGINAL DELIVERY  2009, 2007   x 2   OB History  Gravida Para Term Preterm AB Living  3 2 1 1  0 2  SAB TAB Ectopic Multiple Live Births  0 0 0 0 2    # Outcome Date GA Lbr Len/2nd Weight Sex Delivery Anes PTL Lv  3 Current           2 Term 05/22/08   2722 g M Vag-Spont   LIV  1 Preterm 11/18/05   2466 g M Vag-Spont EPI N LIV  Patient denies any other pertinent gynecologic issues.   No current facility-administered medications on file prior to encounter.   Current Outpatient Medications on File Prior to Encounter  Medication Sig Dispense Refill  . acetaminophen (TYLENOL) 500 MG tablet Take 1,000 mg by mouth every 6 (six) hours as needed for mild pain or headache.    Marland Kitchen aspirin 81 MG chewable tablet Chew 1 tablet (81 mg total) by mouth daily. 30 tablet 6  . labetalol (NORMODYNE) 200 MG tablet Take 1 tablet (200 mg total) by mouth 2 (two) times daily. 60 tablet 6  . Prenatal Vit-Fe Fumarate-FA (PRENATAL VITAMINS) 28-0.8 MG TABS Take 1 tablet by mouth daily. 30 tablet 12  . Blood Pressure Monitoring DEVI 1 Device by Does not apply route once a week. 1 Device 0   No Known Allergies  Social History:   reports that she has been smoking cigarettes. She has a 3.75 pack-year smoking history. She has never used smokeless tobacco. She reports previous alcohol use. She reports that she does not use drugs.  Family History  Problem Relation Age of Onset  . Hypertension Mother    . Hypertension Father     Review of Systems: Pertinent items noted in HPI and remainder of comprehensive ROS otherwise negative.  PHYSICAL EXAM: Blood pressure 120/70, pulse 69, temperature 97.9 F (36.6 C), temperature source Oral, resp. rate 18, height 5\' 8"  (1.727 m), weight 81.6 kg, last menstrual period 04/03/2019, SpO2 100 %. FHR: 150 bpm CONSTITUTIONAL: Well-developed, well-nourished female in no acute distress.  HENT:  Normocephalic, atraumatic, External right and left ear normal. Oropharynx is clear and moist EYES: Conjunctivae and EOM are normal. Pupils are equal, round, and reactive to light. No scleral icterus.  NECK: Normal range of motion, supple, no masses SKIN: Skin is warm and dry. No rash noted. Not diaphoretic. No erythema. No pallor. NEUROLOGIC: Alert and oriented to person, place, and time. Normal reflexes, muscle tone coordination. No cranial nerve deficit noted. PSYCHIATRIC: Normal mood and affect. Normal behavior. Normal judgment and thought content. CARDIOVASCULAR: Normal heart rate noted, regular rhythm RESPIRATORY: Effort and breath sounds normal, no problems with respiration noted ABDOMEN: Soft, nontender, nondistended. Well healed vertical scar in lower abdomen. PELVIC: Deferred MUSCULOSKELETAL: Normal range of motion. No edema and no tenderness. 2+ distal pulses.  Labs: Results for orders placed or performed during the hospital encounter of 08/02/19 (from the  past 336 hour(s))  SARS CORONAVIRUS 2 (TAT 6-24 HRS) Nasopharyngeal Nasopharyngeal Swab   Collection Time: 08/02/19  2:56 PM   Specimen: Nasopharyngeal Swab  Result Value Ref Range   SARS Coronavirus 2 NEGATIVE NEGATIVE    Imaging Studies: MR PELVIS WO CONTRAST  Result Date: 07/28/2019 CLINICAL DATA:  Bilateral adnexal masses. Previous surgical resection of ovarian dermoids. Second trimester pregnancy. EXAM: MRI PELVIS WITHOUT CONTRAST TECHNIQUE: Multiplanar multisequence MR imaging of the pelvis  was performed. No intravenous contrast was administered. COMPARISON:  Ultrasound on 05/14/2019 FINDINGS: Lower Urinary Tract: No urinary bladder or urethral abnormality identified. Bowel: Unremarkable appearance of rectum and other pelvic bowel loops. Vascular/Lymphatic: Unremarkable. No pathologically enlarged pelvic lymph nodes identified. Reproductive: -- Uterus: A single intrauterine pregnancy is seen. Fetus is in breech presentation. Cervix is long and closed. -- Right ovary: A cystic lesion is seen in the right adnexa which measures 11.2 x 8.1 cm, and has a thin internal partial septation or fold. No thickened septations, mural nodularity or soft tissue component identified. -- Left ovary: A lobular soft tissue mass is seen in the left adnexa which measures 5.9 x 4.3 cm and has homogeneous fat signal intensity. This consistent with a benign ovarian dermoid. Other: No peritoneal thickening or abnormal free fluid. Musculoskeletal:  Unremarkable. IMPRESSION: 1. Single intrauterine pregnancy. 2. 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 3. 11.2 cm cystic lesion in the right adnexa with probably benign characteristics. Continued follow-up is recommended by pelvic MRI without contrast in 8-12 weeks. Electronically Signed   By: Marlaine Hind M.D.   On: 07/28/2019 14:34   [place] MFM OB DETAIL +14 WK  Result Date: 08/03/2019 ----------------------------------------------------------------------  OBSTETRICS REPORT                       (Signed Final 08/03/2019 03:20 pm) ---------------------------------------------------------------------- Patient Info  ID #:       [id number]                          D.O.B.:  11/16/1985 (33 yrs)  Name:       Ann Bruce                  Visit Date: 08/03/2019 02:02 pm ---------------------------------------------------------------------- Performed By  Performed By:     Valda Favia          Referred By:      [facility]                    RDMS  Attending:        Johnell Comings MD          [facility] ---------------------------------------------------------------------- Orders   #  Description                          Code         Ordered By   1  [place] MFM OB DETAIL +14 [place]  [id number]     ROLITTA DAWSON  ----------------------------------------------------------------------   #  Order #                    Accession #                 Episode #   1  [id number]                  [id number]                  [id number]  ---------------------------------------------------------------------- Indications   Advanced maternal age multigravida 74+,        O15.522   second trimester   [redacted] weeks gestation of pregnancy                Z3A.17   Encounter for antenatal screening for          Z36.3   malformations   Low risk NIPS   Obesity complicating pregnancy, second         O99.212   trimester   Medical complication of pregnancy (bilateral   O26.90   ovarian cyst surgery 08/04/19)   Hypertension - Chronic/Pre-existing (ASA)      O10.019  ---------------------------------------------------------------------- Fetal Evaluation  Num Of Fetuses:         1  Fetal Heart Rate(bpm):  151  Cardiac Activity:       Observed  Presentation:           Cephalic  Placenta:               Anterior  P. Cord Insertion:      Visualized, central  Amniotic Fluid  AFI FV:      Within normal limits                              Largest Pocket(cm)                              7.0 ---------------------------------------------------------------------- Biometry  BPD:      40.5  mm     G. Age:  18w 2d         85  %    CI:        76.14   %    70 - 86                                                          FL/HC:      18.4   %    14.6 - 17.6  HC:      147.1  mm     G. Age:  17w 6d         64  %    HC/AC:      1.13        1.07 - 1.29  AC:      130.1  mm     G. Age:  18w 4d         83  %    FL/BPD:     66.9   %  FL:       27.1  mm     G. Age:  5w 2d  76  %    FL/AC:      20.8   %    20 - 24  HUM:      26.8  mm     G. Age:  18w 4d         85  %  CER:      18.5  mm     G. Age:  18w 2d         70  %  NFT:       3.8  mm  CM:          5  mm  Est. FW:     236  gm      0 lb 8 oz     93  % ---------------------------------------------------------------------- OB History  Gravidity:    3         Term:   1        Prem:   1        SAB:   0  TOP:          0       Ectopic:  0        Living: 2 ---------------------------------------------------------------------- Gestational Age  LMP:           17w 3d        Date:  04/03/19                 EDD:   01/08/20  U/S Today:     18w 2d                                        EDD:   01/02/20  Best:          17w 3d     Det. By:  LMP  (04/03/19)          EDD:   01/08/20 ---------------------------------------------------------------------- Anatomy  Cranium:               Appears normal         Aortic Arch:            Not well visualized  Cavum:                 Appears normal         Ductal Arch:            Not well visualized  Ventricles:            Appears normal         Diaphragm:              Appears normal  Choroid Plexus:        Appears normal         Stomach:                Appears normal, left                                                                        sided  Cerebellum:            Appears normal  Abdomen:                Appears normal  Posterior Fossa:       Appears normal         Abdominal Wall:         Appears nml (cord                                                                        insert, abd wall)  Nuchal Fold:           Appears normal         Cord Vessels:           Appears normal (3                                                                        vessel cord)  Face:                  Appears normal         Kidneys:                Appear normal                         (orbits and profile)  Lips:                  Appears normal         Bladder:                Appears normal  Thoracic:               Appears normal         Spine:                  Not well visualized  Heart:                 Not well visualized    Upper Extremities:      Appears normal  RVOT:                  Not well visualized    Lower Extremities:      Appears normal  LVOT:                  Not well visualized  Other:  Technically difficult due to early gestational age. Technically difficult          due to maternal habitus and fetal position. Heels and 5th digit          visualized. ---------------------------------------------------------------------- Cervix Uterus Adnexa  Cervix  Length:            3.4  cm.  Normal appearance by transabdominal scan.  Uterus  No abnormality visualized.  Left Ovary  No adnexal mass visualized.  Right Ovary  No adnexal mass visualized.  Cul De Sac  No free fluid seen.  Adnexa  No abnormality visualized. ----------------------------------------------------------------------  Comments  This patient was seen for a detailed fetal anatomy scan due  to advanced maternal age and history of chronic  hypertension currently treated with labetalol.  The patient  reports that she has an 11 cm adnexal cyst that was noted on  a prior MRI exam.  She is scheduled for surgery tomorrow for  removal of this adnexal cyst.  She denies any other significant past medical history and  denies any problems in her current pregnancy.  She had a cell free DNA test earlier in her pregnancy which  indicated a low risk for trisomy 109, 58, and 13. A female fetus is  predicted.  She was informed that the fetal growth and amniotic fluid  level were appropriate for her gestational age.  The views of the fetal anatomy were limited today due to her  early gestational age.  The patient was informed that anomalies may be missed due  to technical limitations. If the fetus is in a suboptimal position  or maternal habitus is increased, visualization of the fetus in  the maternal uterus may be impaired.  An 11 cm simple appearing adnexal cyst with  a single  septation most likely located on the right side was noted  today.  The implications and management of chronic hypertension in  pregnancy was discussed. The patient was advised that  should her blood pressures continue to be elevated, the  dosage of her antihypertensive medications may need to be  increased.  The increased risk of superimposed  preeclampsia, an indicated preterm delivery, and possible  fetal growth restriction due to chronic hypertension in  pregnancy was discussed. The patient was advised that we  will continue to follow her closely throughout her pregnancy.  We will continue to follow her with monthly growth scans.  Weekly fetal testing should be started at around 32 weeks.  To decrease her risk of superimposed preeclampsia, she  should continue taking a daily baby aspirin (81 mg daily) for  preeclampsia prophylaxis.  The increased risk of fetal aneuploidy due to advanced  maternal age was discussed. Due to advanced maternal age,  the patient was offered and declined an amniocentesis today  for definitive diagnosis of fetal aneuploidy.  A follow-up exam was scheduled in 4 weeks to complete the  views of the fetal anatomy. ----------------------------------------------------------------------                   Johnell Comings, MD Electronically Signed Final Report   08/03/2019 03:20 pm ----------------------------------------------------------------------   Assessment: Patient Active Problem List   Diagnosis Date Noted  . Ovarian cyst affecting pregnancy in second trimester, antepartum 08/04/2019  . Bilateral ovarian cysts 07/12/2019  . Supervision of high risk pregnancy, antepartum 06/15/2019  . Hypertension affecting pregnancy, antepartum 06/15/2019    Plan: Patient will undergo surgical management with ELAP, bilateral ovarian cystectomy in second trimester of pregnancy.  She was told that the incision will be low transverse on the skin, this will heal faster than her previous  vertical scar.  The risks of surgery were discussed in detail with the patient including but not limited to: elevated risk of miscarriage; bleeding which may require transfusion or reoperation; infection which may require antibiotics; injury to surrounding organs which may involve bowel, bladder, ureters; need for additional procedures; thromboembolic phenomenon, surgical site problems and other postoperative/anesthesia complications. Likelihood of success in alleviating the patient's condition was discussed. Routine postoperative instructions will be reviewed with the patient and her family in detail after surgery.  The patient concurred with the proposed plan, giving informed written consent for the surgery.  Patient has been NPO since last night and she will remain NPO for procedure.  Anesthesia and OR aware.  Preoperative prophylactic antibiotics and SCDs ordered on call to the OR.  To OR when ready.    [name], MD, [place] for [employer], [place]

## 2019-08-05 LAB — COMPREHENSIVE METABOLIC PANEL
ALT: 17 U/L (ref 0–44)
AST: 19 U/L (ref 15–41)
Albumin: 2.6 g/dL — ABNORMAL LOW (ref 3.5–5.0)
Alkaline Phosphatase: 25 U/L — ABNORMAL LOW (ref 38–126)
Anion gap: 10 (ref 5–15)
BUN: <5 mg/dL — ABNORMAL LOW (ref 6–20)
CO2: 22 mmol/L (ref 22–32)
Calcium: 8.4 mg/dL — ABNORMAL LOW (ref 8.9–10.3)
Chloride: 104 mmol/L (ref 98–111)
Creatinine, Ser: 0.5 mg/dL (ref 0.44–1.00)
GFR calc Af Amer: >60 mL/min (ref >60)
GFR calc non Af Amer: >60 mL/min (ref >60)
Glucose, Bld: 98 mg/dL (ref 70–99)
Potassium: 3.9 mmol/L (ref 3.5–5.1)
Sodium: 136 mmol/L (ref 135–145)
Total Bilirubin: 0.5 mg/dL (ref 0.3–1.2)
Total Protein: 5.2 g/dL — ABNORMAL LOW (ref 6.5–8.1)

## 2019-08-05 LAB — CBC
HCT: 29.6 % — ABNORMAL LOW (ref 36.0–46.0)
Hemoglobin: 10 g/dL — ABNORMAL LOW (ref 12.0–15.0)
MCH: 31.1 pg (ref 26.0–34.0)
MCHC: 33.8 g/dL (ref 30.0–36.0)
MCV: 91.9 fL (ref 80.0–100.0)
Platelets: 216 10*3/uL (ref 150–400)
RBC: 3.22 MIL/uL — ABNORMAL LOW (ref 3.87–5.11)
RDW: 12.9 % (ref 11.5–15.5)
WBC: 10.1 10*3/uL (ref 4.0–10.5)
nRBC: 0 % (ref 0.0–0.2)

## 2019-08-05 LAB — SURGICAL PATHOLOGY

## 2019-08-05 MED ORDER — SENNOSIDES-DOCUSATE SODIUM 8.6-50 MG PO TABS: 2.0000 | ORAL_TABLET | Freq: Every evening | ORAL | 2 refills | Status: DC | PRN

## 2019-08-05 MED ORDER — OXYCODONE HCL 5 MG PO TABS: 5.0000 mg | ORAL_TABLET | Freq: Four times a day (QID) | ORAL | 0 refills | Status: DC | PRN

## 2019-08-05 MED ORDER — CYCLOBENZAPRINE HCL 10 MG PO TABS: 10.0000 mg | ORAL_TABLET | Freq: Three times a day (TID) | ORAL | 2 refills | Status: DC | PRN

## 2019-08-05 NOTE — Progress Notes (Signed)
1 Day Post-Op Procedure(s) (LRB): EXPLORATORY LAPAROTOMY WITH BILATERAL OVARIAN CYSTECTOMY (Bilateral)  Subjective: Patient reports tolerating PO.  She has just started ambulating.  No flatus yet.  No bleeding.  Not yet voiding.  No concerns at this time.  Objective: I have reviewed patient's vital signs, medications and labs.  General: alert, cooperative, appears stated age and no distress Resp: no shortness of breath, no wheezing Cardio: regular rate, no JVD GI: soft, appropriately tender; dressing in place and dry Extremities: extremities normal, atraumatic, no cyanosis or edema Vaginal Bleeding: none  Assessment: s/p Procedure(s) with comments: EXPLORATORY LAPAROTOMY WITH BILATERAL OVARIAN CYSTECTOMY (Bilateral) - Dr. Harolyn Rutherford will check fetal heart tones before and after surgery: stable, progressing well and tolerating diet  Plan: Advance diet Encourage ambulation Anticipate discharge tomorrow  LOS: 1 day    Debbrah Alar 08/05/2019, 9:32 AM

## 2019-08-05 NOTE — Plan of Care (Signed)
  Problem: Education: Goal: Knowledge of General Education information will improve Description: Including pain rating scale, medication(s)/side effects and non-pharmacologic comfort measures Outcome: Progressing   Problem: Health Behavior/Discharge Planning: Goal: Ability to manage health-related needs will improve Outcome: Progressing   Problem: Clinical Measurements: Goal: Ability to maintain clinical measurements within normal limits will improve Outcome: Progressing Goal: Will remain free from infection Outcome: Progressing Goal: Diagnostic test results will improve Outcome: Progressing   Problem: Clinical Measurements: Goal: Will remain free from infection Outcome: Progressing   

## 2019-08-05 NOTE — Progress Notes (Signed)
Phone Encounter  1 Day Post-Op Procedure(s) (LRB): EXPLORATORY LAPAROTOMY WITH BILATERAL OVARIAN CYSTECTOMY (Bilateral)  Subjective: Patient reports incisional pain and tolerating PO.  Awaiting spontaneous voiding. No flatus yet.   Objective: I have reviewed patient's vital signs, intake and output, medications and labs. Physical exam not done due to nature of phone encounter, will be done later by Dr. Vevelyn Francois.  CBC Latest Ref Rng & Units 08/05/2019 08/04/2019 06/24/2019  WBC 4.0 - 10.5 K/uL 10.1 5.4 7.1  Hemoglobin 12.0 - 15.0 g/dL 10.0(L) 11.6(L) 11.9  Hematocrit 36.0 - 46.0 % 29.6(L) 36.0 35.6  Platelets 150 - 400 K/uL 216 231 248   CMP Latest Ref Rng & Units 08/05/2019 08/04/2019 06/24/2019  Glucose 70 - 99 mg/dL 98 83 68  BUN 6 - 20 mg/dL <5(L) 6 8  Creatinine 0.44 - 1.00 mg/dL 0.50 0.41(L) 0.45(L)  Sodium 135 - 145 mmol/L 136 136 140  Potassium 3.5 - 5.1 mmol/L 3.9 3.6 3.9  Chloride 98 - 111 mmol/L 104 106 104  CO2 22 - 32 mmol/L 22 20(L) 22  Calcium 8.9 - 10.3 mg/dL 8.4(L) 8.5(L) 9.2  Total Protein 6.5 - 8.1 g/dL 5.2(L) - 6.9  Total Bilirubin 0.3 - 1.2 mg/dL 0.5 - <0.2  Alkaline Phos 38 - 126 U/L 25(L) - 30(L)  AST 15 - 41 U/L 19 - 13  ALT 0 - 44 U/L 17 - 14   Assessment: s/p Procedure(s) with comments: EXPLORATORY LAPAROTOMY WITH BILATERAL OVARIAN CYSTECTOMY (Bilateral): stable, progressing well and tolerating diet  Plan: Discussed details of surgery with patient, all questions answered.  Advance diet Encourage ambulation Advance to PO medication Discontinue IV fluids Discharge home tomorrow if stable.   LOS: 1 day   [name], MD 08/05/2019, 9:18 AM

## 2019-08-06 MED ORDER — BISACODYL 10 MG RE SUPP
10.0000 mg | Freq: Once | RECTAL | Status: AC
  Administered 2019-08-06: 10 mg via RECTAL
  Filled 2019-08-06: qty 1

## 2019-08-06 NOTE — Discharge Summary (Signed)
Antenatal Physician Postoperative Discharge Summary  Patient ID: Ann Bruce MRN: [id number] DOB/AGE: 12-03-1985 34 y.o.  Admit date: 08/04/2019 Discharge date: 08/06/2019  Preoperative Diagnoses: Bilateral large symptomatic ovarian cysts in second trimester of pregnancy  Procedures: Procedure(s) (LRB): EXPLORATORY LAPAROTOMY WITH BILATERAL OVARIAN CYSTECTOMY (Bilateral)  Hospital Course:  Ann Bruce is a 34 y.o. [id number] at [redacted]w[redacted]d admitted for scheduled surgery.  She underwent the procedure as mentioned above, her operation was uncomplicated. For further details about surgery, please refer to the operative report. Patient had an uncomplicated postoperative course. By time of discharge on POD#2, her pain was controlled on oral pain medications; she was ambulating, voiding without difficulty, tolerating regular diet and passing flatus. Reassuring FHR checks during admission, no signs/symptoms of threatened miscarriage.  Reviewed benign pathology results with patient.   She was deemed stable for discharge to home.   Significant Labs and Imaging: 08/04/19 Surgical Pathology A. OVARIAN CYST, RIGHT:  - Mature teratoma.  - No malignancy identified.   B. OVARIAN CYST, LEFT:  - Mature teratoma.  - No malignancy identified.  CBC Latest Ref Rng & Units 08/05/2019 08/04/2019 06/24/2019  WBC 4.0 - 10.5 K/uL 10.1 5.4 7.1  Hemoglobin 12.0 - 15.0 g/dL 10.0(L) 11.6(L) 11.9  Hematocrit 36.0 - 46.0 % 29.6(L) 36.0 35.6  Platelets 150 - 400 K/uL 216 231 248   CMP Latest Ref Rng & Units 08/05/2019 08/04/2019 06/24/2019  Glucose 70 - 99 mg/dL 98 83 68  BUN 6 - 20 mg/dL <5(L) 6 8  Creatinine 0.44 - 1.00 mg/dL 0.50 0.41(L) 0.45(L)  Sodium 135 - 145 mmol/L 136 136 140  Potassium 3.5 - 5.1 mmol/L 3.9 3.6 3.9  Chloride 98 - 111 mmol/L 104 106 104  CO2 22 - 32 mmol/L 22 20(L) 22  Calcium 8.9 - 10.3 mg/dL 8.4(L) 8.5(L) 9.2  Total Protein 6.5 - 8.1 g/dL 5.2(L) - 6.9  Total Bilirubin 0.3 - 1.2 mg/dL 0.5 - <0.2   Alkaline Phos 38 - 126 U/L 25(L) - 30(L)  AST 15 - 41 U/L 19 - 13  ALT 0 - 44 U/L 17 - 14   MR PELVIS WO CONTRAST  Result Date: 07/28/2019 CLINICAL DATA:  Bilateral adnexal masses. Previous surgical resection of ovarian dermoids. Second trimester pregnancy. EXAM: MRI PELVIS WITHOUT CONTRAST TECHNIQUE: Multiplanar multisequence MR imaging of the pelvis was performed. No intravenous contrast was administered. COMPARISON:  Ultrasound on 05/14/2019 FINDINGS: Lower Urinary Tract: No urinary bladder or urethral abnormality identified. Bowel: Unremarkable appearance of rectum and other pelvic bowel loops. Vascular/Lymphatic: Unremarkable. No pathologically enlarged pelvic lymph nodes identified. Reproductive: -- Uterus: A single intrauterine pregnancy is seen. Fetus is in breech presentation. Cervix is long and closed. -- Right ovary: A cystic lesion is seen in the right adnexa which measures 11.2 x 8.1 cm, and has a thin internal partial septation or fold. No thickened septations, mural nodularity or soft tissue component identified. -- Left ovary: A lobular soft tissue mass is seen in the left adnexa which measures 5.9 x 4.3 cm and has homogeneous fat signal intensity. This consistent with a benign ovarian dermoid. Other: No peritoneal thickening or abnormal free fluid. Musculoskeletal:  Unremarkable. IMPRESSION: 1. Single intrauterine pregnancy. 2. 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 3. 11.2 cm cystic lesion in the right adnexa with probably benign characteristics. Continued follow-up is recommended by pelvic MRI without contrast in 8-12 weeks. Electronically Signed   By: Marlaine Hind M.D.   On: 07/28/2019 14:34   [place] MFM OB DETAIL +  85 WK  Result Date: 08/03/2019 ----------------------------------------------------------------------  OBSTETRICS REPORT                       (Signed Final 08/03/2019 03:20 pm) ---------------------------------------------------------------------- Patient Info  ID #:        [id number]                          D.O.B.:  [birth year] (34 yrs)  Name:       Ann Bruce                  Visit Date: 08/03/2019 02:02 pm ---------------------------------------------------------------------- Performed By  Performed By:     Valda Favia          Referred By:      [facility]                    RDMS  Attending:        Johnell Comings MD         [facility] ---------------------------------------------------------------------- Orders   #  Description                          Code         Ordered By   1  [place] MFM OB DETAIL +14 WK              76811.01     ROLITTA DAWSON  ----------------------------------------------------------------------   #  Order #                    Accession #                 Episode #   1  [id number]                  [id number]                  [id number]  ---------------------------------------------------------------------- Indications   Advanced maternal age multigravida 54+,        O71.522   second trimester   [redacted] weeks gestation of pregnancy                Z3A.17   Encounter for antenatal screening for          Z36.3   malformations   Low risk NIPS   Obesity complicating pregnancy, second         O99.212   trimester   Medical complication of pregnancy (bilateral   O26.90   ovarian cyst surgery 08/04/19)   Hypertension - Chronic/Pre-existing (ASA)      O10.019  ---------------------------------------------------------------------- Fetal Evaluation  Num Of Fetuses:         1  Fetal Heart Rate(bpm):  151  Cardiac Activity:       Observed  Presentation:           Cephalic  Placenta:               Anterior  P.  Cord Insertion:      Visualized, central  Amniotic Fluid  AFI FV:      Within normal limits                              Largest Pocket(cm)                              7.0 ---------------------------------------------------------------------- Biometry  BPD:      40.5  mm     G.  Age:  18w 2d         85  %    CI:        76.14   %    70 - 86                                                          FL/HC:      18.4   %    14.6 - 17.6  HC:      147.1  mm     G. Age:  17w 6d         64  %    HC/AC:      1.13        1.07 - 1.29  AC:      130.1  mm     G. Age:  18w 4d         83  %    FL/BPD:     66.9   %  FL:       27.1  mm     G. Age:  18w 2d         76  %    FL/AC:      20.8   %    20 - 24  HUM:      26.8  mm     G. Age:  18w 4d         85  %  CER:      18.5  mm     G. Age:  18w 2d         70  %  NFT:       3.8  mm  CM:          5  mm  Est. FW:     236  gm      0 lb 8 oz     93  % ---------------------------------------------------------------------- OB History  Gravidity:    3         Term:   1        Prem:   1        SAB:   0  TOP:          0       Ectopic:  0        Living: 2 ---------------------------------------------------------------------- Gestational Age  LMP:           17w 3d        Date:  04/03/19                 EDD:   01/08/20  U/S Today:     18w 2d  EDD:   01/02/20  Best:          Isaac Bliss 3d     Det. By:  LMP  (04/03/19)          EDD:   01/08/20 ---------------------------------------------------------------------- Anatomy  Cranium:               Appears normal         Aortic Arch:            Not well visualized  Cavum:                 Appears normal         Ductal Arch:            Not well visualized  Ventricles:            Appears normal         Diaphragm:              Appears normal  Choroid Plexus:        Appears normal         Stomach:                Appears normal, left                                                                        sided  Cerebellum:            Appears normal         Abdomen:                Appears normal  Posterior Fossa:       Appears normal         Abdominal Wall:         Appears nml (cord                                                                        insert, abd wall)  Nuchal Fold:           Appears  normal         Cord Vessels:           Appears normal (3                                                                        vessel cord)  Face:                  Appears normal         Kidneys:                Appear normal                         (  orbits and profile)  Lips:                  Appears normal         Bladder:                Appears normal  Thoracic:              Appears normal         Spine:                  Not well visualized  Heart:                 Not well visualized    Upper Extremities:      Appears normal  RVOT:                  Not well visualized    Lower Extremities:      Appears normal  LVOT:                  Not well visualized  Other:  Technically difficult due to early gestational age. Technically difficult          due to maternal habitus and fetal position. Heels and 5th digit          visualized. ---------------------------------------------------------------------- Cervix Uterus Adnexa  Cervix  Length:            3.4  cm.  Normal appearance by transabdominal scan.  Uterus  No abnormality visualized.  Left Ovary  No adnexal mass visualized.  Right Ovary  No adnexal mass visualized.  Cul De Sac  No free fluid seen.  Adnexa  No abnormality visualized. ---------------------------------------------------------------------- Comments  This patient was seen for a detailed fetal anatomy scan due  to advanced maternal age and history of chronic  hypertension currently treated with labetalol.  The patient  reports that she has an 11 cm adnexal cyst that was noted on  a prior MRI exam.  She is scheduled for surgery tomorrow for  removal of this adnexal cyst.  She denies any other significant past medical history and  denies any problems in her current pregnancy.  She had a cell free DNA test earlier in her pregnancy which  indicated a low risk for trisomy 9, 53, and 13. A female fetus is  predicted.  She was informed that the fetal growth and amniotic fluid  level were appropriate for her  gestational age.  The views of the fetal anatomy were limited today due to her  early gestational age.  The patient was informed that anomalies may be missed due  to technical limitations. If the fetus is in a suboptimal position  or maternal habitus is increased, visualization of the fetus in  the maternal uterus may be impaired.  An 11 cm simple appearing adnexal cyst with a single  septation most likely located on the right side was noted  today.  The implications and management of chronic hypertension in  pregnancy was discussed. The patient was advised that  should her blood pressures continue to be elevated, the  dosage of her antihypertensive medications may need to be  increased.  The increased risk of superimposed  preeclampsia, an indicated preterm delivery, and possible  fetal growth restriction due to chronic hypertension in  pregnancy was discussed. The patient was advised that we  will continue to follow her closely throughout her pregnancy.  We will continue to follow her with monthly growth  scans.  Weekly fetal testing should be started at around 32 weeks.  To decrease her risk of superimposed preeclampsia, she  should continue taking a daily baby aspirin (81 mg daily) for  preeclampsia prophylaxis.  The increased risk of fetal aneuploidy due to advanced  maternal age was discussed. Due to advanced maternal age,  the patient was offered and declined an amniocentesis today  for definitive diagnosis of fetal aneuploidy.  A follow-up exam was scheduled in 4 weeks to complete the  views of the fetal anatomy. ----------------------------------------------------------------------                   Johnell Comings, MD Electronically Signed Final Report   08/03/2019 03:20 pm ----------------------------------------------------------------------   Discharge Exam: Blood pressure 117/64, pulse 85, temperature 98.4 F (36.9 C), temperature source Oral, resp. rate 18, height 5\' 8"  (1.727 m), weight 81.6 kg, last  menstrual period 04/03/2019, SpO2 99 %. General appearance: alert and no distress  Resp: clear to auscultation bilaterally  Cardio: regular rate and rhythm  GI: soft, non-tender; gravid fundus, mild distention, bowel sounds normal; no masses, no organomegaly.  Incision: C/D/I, no erythema, no drainage noted Pelvic: deferred  Extremities: extremities normal, atraumatic, no cyanosis or edema and Homans sign is negative, no sign of DVT  Discharged Condition: Stable   Discharge disposition: 01-Home or Self Care   Discharge Instructions    Discharge diet:  No restrictions   Complete by: As directed    Discharge wound care:   Complete by: As directed    No sexual activity restrictions   Complete by: As directed    Notify physician for a general feeling that "something is not right"   Complete by: As directed    Notify physician for increase or change in vaginal discharge   Complete by: As directed    Notify physician for vaginal bleeding   Complete by: As directed      Allergies as of 08/06/2019   No Known Allergies     Medication List    TAKE these medications   acetaminophen 500 MG tablet Commonly known as: TYLENOL Take 1,000 mg by mouth every 6 (six) hours as needed for mild pain or headache.   aspirin 81 MG chewable tablet Chew 1 tablet (81 mg total) by mouth daily.   Blood Pressure Monitoring Devi 1 Device by Does not apply route once a week.   cyclobenzaprine 10 MG tablet Commonly known as: FLEXERIL Take 1 tablet (10 mg total) by mouth 3 (three) times daily as needed for muscle spasms.   labetalol 200 MG tablet Commonly known as: NORMODYNE Take 1 tablet (200 mg total) by mouth 2 (two) times daily.   oxyCODONE 5 MG immediate release tablet Commonly known as: Oxy IR/ROXICODONE Take 1 tablet (5 mg total) by mouth every 6 (six) hours as needed for moderate pain or severe pain.   Prenatal Vitamins 28-0.8 MG Tabs Take 1 tablet by mouth daily.   senna-docusate  8.6-50 MG tablet Commonly known as: Senokot-S Take 2 tablets by mouth at bedtime as needed for mild constipation or moderate constipation.            Discharge Care Instructions  (From admission, onward)         Start     Ordered   08/06/19 0000  Discharge wound care:     08/06/19 0817         Future Appointments  Date Time Provider [place]  08/13/2019  8:35 AM Leya Paige,  Sallyanne Havers, MD [facility]  08/31/2019  3:00 PM [place] NURSE [place] [facility]  08/31/2019  3:00 PM [place] 3 [facility]     Signed: [name] M.D. 08/06/2019, 8:17 AM

## 2019-08-06 NOTE — Plan of Care (Signed)
Patient discharged home with printed instructions. No concerns noted. [place]

## 2019-08-11 ENCOUNTER — Telehealth: Payer: Medicaid Other | Admitting: Obstetrics and Gynecology

## 2019-08-13 ENCOUNTER — Other Ambulatory Visit: Payer: Self-pay

## 2019-08-13 ENCOUNTER — Ambulatory Visit (INDEPENDENT_AMBULATORY_CARE_PROVIDER_SITE_OTHER): Payer: Medicaid Other | Admitting: Obstetrics & Gynecology

## 2019-08-13 VITALS — BP 121/68 | HR 98 | Wt 187.2 lb

## 2019-08-13 DIAGNOSIS — N83209 Unspecified ovarian cyst, unspecified side: Secondary | ICD-10-CM

## 2019-08-13 DIAGNOSIS — O0990 Supervision of high risk pregnancy, unspecified, unspecified trimester: Secondary | ICD-10-CM

## 2019-08-13 DIAGNOSIS — O169 Unspecified maternal hypertension, unspecified trimester: Secondary | ICD-10-CM

## 2019-08-13 DIAGNOSIS — O3482 Maternal care for other abnormalities of pelvic organs, second trimester: Secondary | ICD-10-CM

## 2019-08-13 NOTE — Progress Notes (Signed)
   PRENATAL VISIT NOTE  Subjective:  Ann Bruce is a 34 y.o. G3P1102 at [redacted]w[redacted]d being seen today for ongoing prenatal care.  She is currently monitored for the following issues for this high-risk pregnancy and has Supervision of high risk pregnancy, antepartum; Hypertension affecting pregnancy, antepartum; Bilateral ovarian cysts; and Ovarian cyst affecting pregnancy in second trimester, antepartum on their problem list.  Patient underwent ELAP, bilateral ovarian cystectomy on 08/04/19 for bilateral dermoid cysts.  She reports some incisional pain and LLQ pain, otherwise doing well.  Contractions: Not present. Vag. Bleeding: None.  Movement: Present. Denies leaking of fluid.   The following portions of the patient's history were reviewed and updated as appropriate: allergies, current medications, past family history, past medical history, past social history, past surgical history and problem list.   Objective:   Vitals:   08/13/19 0848  BP: 121/68  Pulse: 98  Weight: 187 lb 3.2 oz (84.9 kg)    Fetal Status: Fetal Heart Rate (bpm): 150 Fundal Height: 19 cm Movement: Present     General:  Alert, oriented and cooperative. Patient is in no acute distress.  Skin: Skin is warm and dry. No rash noted.   Cardiovascular: Normal heart rate noted  Respiratory: Normal respiratory effort, no problems with respiration noted  Abdomen: Soft, gravid, appropriate for gestational age.  Pain/Pressure: Present     Pelvic: Cervical exam deferred        Extremities: Normal range of motion.  Edema: None  Mental Status: Normal mood and affect. Normal behavior. Normal judgment and thought content.   Assessment and Plan:  Pregnancy: G3P1102 at [redacted]w[redacted]d 1. Hypertension affecting pregnancy, antepartum Stable BP on Labetalol.    2. Ovarian cyst affecting pregnancy in second trimester, antepartum S/p ELAP, bilateral cystectomy 08/04/19>>pathology showed bilateral dermoids. Doing well postoperatively.   3.  Supervision of high risk pregnancy, antepartum Anatomy scan ordered on 08/31/19.  Low risk NIPS, offered AFP and she agreed to do this.  - AFP, Serum, Open Spina Bifida - Hepatitis C antibody No other complaints or concerns.  Routine obstetric precautions reviewed.  Please refer to After Visit Summary for other counseling recommendations.   Return in about 3 weeks (around 09/03/2019) for [facility] Visit.  Future Appointments  Date Time Provider [place]  08/31/2019  3:00 PM [place] [facility]  08/31/2019  3:00 PM [place] 3 [facility]  09/03/2019  9:15 AM [place], Vickii Chafe, MD [facility]    [name], MD

## 2019-08-13 NOTE — Patient Instructions (Signed)
Return to office for any scheduled appointments. Call the office or go to the MAU at [facility] if:  You begin to have strong, frequent contractions  Your water breaks.  Sometimes it is a big gush of fluid, sometimes it is just a trickle that keeps getting your panties wet or running down your legs  You have vaginal bleeding.  It is normal to have a small amount of spotting if your cervix was checked.   You do not feel your baby moving like normal.  If you do not, get something to eat and drink and lay down and focus on feeling your baby move.   If your baby is still not moving like normal, you should call the office or go to MAU.  Any other obstetric concerns.   

## 2019-08-13 NOTE — Progress Notes (Signed)
[place] to verify pt's BP cuff is ready. Pt states she would like the cuff delivered to her home. Requested cuff be delivered to pt; verified address and phone number with Christy Sartorius who states the cuff will be delivered to the pt's address today.   Apolonio Schneiders RN 08/13/19

## 2019-08-13 NOTE — Progress Notes (Signed)
Here for ob care. Did answer has thoughts of better off dead; but declines plan to end life. Offered to see [facility] which she declined.  Advised to go [facility] ER if has thoughts of suicide. Informed Dr.Anyanwu Shaylea Ucci,RN

## 2019-08-15 LAB — AFP, SERUM, OPEN SPINA BIFIDA
AFP MoM: 0.76
AFP Value: 35.7 ng/mL
Gest. Age on Collection Date: 18.9 weeks
Maternal Age At EDD: 35 yr
OSBR Risk 1 IN: 10000
Test Results:: NEGATIVE
Weight: 187 [lb_av]

## 2019-08-15 LAB — HEPATITIS C ANTIBODY: Hep C Virus Ab: <0.1 s/co ratio (ref 0.0–0.9)

## 2019-08-31 ENCOUNTER — Ambulatory Visit (HOSPITAL_COMMUNITY)
Admission: RE | Admit: 2019-08-31 | Discharge: 2019-08-31 | Disposition: A | Discharge status: Home / Self Care | Payer: Medicaid Other | Source: Ambulatory Visit | Attending: Obstetrics | Admitting: Obstetrics

## 2019-08-31 ENCOUNTER — Encounter (HOSPITAL_COMMUNITY): Payer: Self-pay | Admitting: *Deleted

## 2019-08-31 ENCOUNTER — Ambulatory Visit (HOSPITAL_COMMUNITY): Payer: Medicaid Other | Admitting: *Deleted

## 2019-08-31 ENCOUNTER — Other Ambulatory Visit: Payer: Self-pay

## 2019-08-31 DIAGNOSIS — Z362 Encounter for other antenatal screening follow-up: Secondary | ICD-10-CM

## 2019-08-31 DIAGNOSIS — O10012 Pre-existing essential hypertension complicating pregnancy, second trimester: Secondary | ICD-10-CM

## 2019-08-31 DIAGNOSIS — Z3A21 21 weeks gestation of pregnancy: Secondary | ICD-10-CM

## 2019-08-31 DIAGNOSIS — O09522 Supervision of elderly multigravida, second trimester: Secondary | ICD-10-CM | POA: Insufficient documentation

## 2019-08-31 DIAGNOSIS — O99212 Obesity complicating pregnancy, second trimester: Secondary | ICD-10-CM | POA: Diagnosis not present

## 2019-08-31 DIAGNOSIS — O0990 Supervision of high risk pregnancy, unspecified, unspecified trimester: Secondary | ICD-10-CM | POA: Insufficient documentation

## 2019-08-31 DIAGNOSIS — O169 Unspecified maternal hypertension, unspecified trimester: Secondary | ICD-10-CM

## 2019-08-31 DIAGNOSIS — O2690 Pregnancy related conditions, unspecified, unspecified trimester: Secondary | ICD-10-CM | POA: Diagnosis not present

## 2019-09-01 ENCOUNTER — Other Ambulatory Visit (HOSPITAL_COMMUNITY): Payer: Self-pay | Admitting: *Deleted

## 2019-09-01 DIAGNOSIS — O10912 Unspecified pre-existing hypertension complicating pregnancy, second trimester: Secondary | ICD-10-CM

## 2019-09-03 ENCOUNTER — Telehealth (INDEPENDENT_AMBULATORY_CARE_PROVIDER_SITE_OTHER): Payer: Medicaid Other | Admitting: Obstetrics and Gynecology

## 2019-09-03 ENCOUNTER — Encounter: Payer: Self-pay | Admitting: Obstetrics and Gynecology

## 2019-09-03 VITALS — BP 127/74 | HR 84

## 2019-09-03 DIAGNOSIS — O0990 Supervision of high risk pregnancy, unspecified, unspecified trimester: Secondary | ICD-10-CM

## 2019-09-03 DIAGNOSIS — O162 Unspecified maternal hypertension, second trimester: Secondary | ICD-10-CM

## 2019-09-03 DIAGNOSIS — O169 Unspecified maternal hypertension, unspecified trimester: Secondary | ICD-10-CM

## 2019-09-03 DIAGNOSIS — O3482 Maternal care for other abnormalities of pelvic organs, second trimester: Secondary | ICD-10-CM

## 2019-09-03 DIAGNOSIS — N83201 Unspecified ovarian cyst, right side: Secondary | ICD-10-CM

## 2019-09-03 DIAGNOSIS — N83209 Unspecified ovarian cyst, unspecified side: Secondary | ICD-10-CM

## 2019-09-03 DIAGNOSIS — Z3A21 21 weeks gestation of pregnancy: Secondary | ICD-10-CM | POA: Diagnosis not present

## 2019-09-03 MED ORDER — SENNOSIDES-DOCUSATE SODIUM 8.6-50 MG PO TABS: 2.0000 | ORAL_TABLET | Freq: Every evening | ORAL | 2 refills | Status: DC | PRN

## 2019-09-03 MED ORDER — ASPIRIN EC 81 MG PO TBEC: 81.0000 mg | DELAYED_RELEASE_TABLET | Freq: Every day | ORAL | 2 refills | Status: DC

## 2019-09-03 NOTE — Progress Notes (Signed)
I connected with  Ann Bruce on 09/03/19 at 0921 by telephone and verified that I am speaking with the correct person using two identifiers.   I discussed the limitations, risks, security and privacy concerns of performing an evaluation and management service by telephone and the availability of in person appointments. I also discussed with the patient that there may be a patient responsible charge related to this service. The patient expressed understanding and agreed to proceed.  [name], RN 09/03/2019  9:18 AM

## 2019-09-03 NOTE — Progress Notes (Signed)
OBSTETRICS PRENATAL VIRTUAL VISIT ENCOUNTER NOTE  Provider location: [facility] [place]   I connected with Glo Herring on 09/03/19 at  9:15 AM EDT by MyChart Video Encounter at home and verified that I am speaking with the correct person using two identifiers.   I discussed the limitations, risks, security and privacy concerns of performing an evaluation and management service virtually and the availability of in person appointments. I also discussed with the patient that there may be a patient responsible charge related to this service. The patient expressed understanding and agreed to proceed. Subjective:  Ann Bruce is a 34 y.o. G3P1102 at [redacted]w[redacted]d being seen today for ongoing prenatal care.  She is currently monitored for the following issues for this high-risk pregnancy and has Supervision of high risk pregnancy, antepartum; Hypertension affecting pregnancy, antepartum; Bilateral ovarian cysts; and Ovarian cyst affecting pregnancy in second trimester, antepartum on their problem list.  Patient reports no complaints.  Contractions: Irritability. Vag. Bleeding: None.  Movement: Present. Denies any leaking of fluid.   The following portions of the patient's history were reviewed and updated as appropriate: allergies, current medications, past family history, past medical history, past social history, past surgical history and problem list.   Objective:   Vitals:   09/03/19 0921  BP: 133/80    Fetal Status:     Movement: Present     General:  Alert, oriented and cooperative. Patient is in no acute distress.  Respiratory: Normal respiratory effort, no problems with respiration noted  Mental Status: Normal mood and affect. Normal behavior. Normal judgment and thought content.  Rest of physical exam deferred due to type of encounter  Imaging: [place] MFM OB FOLLOW UP  Result Date: 08/31/2019 ----------------------------------------------------------------------   OBSTETRICS REPORT                       (Signed Final 08/31/2019 04:53 pm) ---------------------------------------------------------------------- Patient Info  ID #:       [id number]                          D.O.B.:  [birth year] (33 yrs)  Name:       Ann Bruce                  Visit Date: 08/31/2019 03:13 pm ---------------------------------------------------------------------- Performed By  Performed By:     Hubert Azure          Referred By:      [facility]                    RDMS  Attending:        Johnell Comings MD         [facility] ---------------------------------------------------------------------- Orders   #  Description                          Code         Ordered By   1  [place]  MFM OB FOLLOW UP                  [id number]     Peterson Ao  ----------------------------------------------------------------------   #  Order #                    Accession #                 Episode #   [id number]  [id number]                  [id number]                  [id number]  ---------------------------------------------------------------------- Indications   Advanced maternal age multigravida 42+,        O83.522   second trimester   [redacted] weeks gestation of pregnancy                Z3A.21   Low risk NIPS   Obesity complicating pregnancy, second         O99.212   trimester   Medical complication of pregnancy (bilateral   O26.90   ovarian cyst surgery 08/04/19)   Hypertension - Chronic/Pre-existing (ASA)      O10.019   Encounter for other antenatal screening        Z36.2   follow-up  ---------------------------------------------------------------------- Fetal Evaluation  Num Of Fetuses:         1  Fetal Heart Rate(bpm):  146  Cardiac Activity:       Observed  Presentation:           Cephalic  Placenta:               Anterior  P. Cord Insertion:      Visualized, central  Amniotic Fluid  AFI FV:      Within normal limits  ---------------------------------------------------------------------- Biometry  BPD:      52.7  mm     G. Age:  22w 0d         71  %    CI:        78.45   %    70 - 86                                                          FL/HC:      21.3   %    15.9 - 20.3  HC:      188.2  mm     G. Age:  21w 1d         27  %    HC/AC:      1.01        1.06 - 1.25  AC:      185.7  mm     G. Age:  23w 3d         93  %    FL/BPD:     76.1   %  FL:       40.1  mm     G. Age:  23w 0d         86  %    FL/AC:      21.6   %    20 - 24  HUM:      38.5  mm  G. Age:  23w 5d       > 95  %  Est. FW:     546  gm      1 lb 3 oz     98  % ---------------------------------------------------------------------- OB History  Gravidity:    3         Term:   1        Prem:   1        SAB:   0  TOP:          0       Ectopic:  0        Living: 2 ---------------------------------------------------------------------- Gestational Age  LMP:           21w 3d        Date:  04/03/19                 EDD:   01/08/20  U/S Today:     22w 3d                                        EDD:   01/01/20  Best:          Audrea Muscat 3d     Det. By:  LMP  (04/03/19)          EDD:   01/08/20 ---------------------------------------------------------------------- Anatomy  Cranium:               Appears normal         Aortic Arch:            Appears normal  Cavum:                 Appears normal         Ductal Arch:            Not well visualized  Ventricles:            Previously seen        Diaphragm:              Appears normal  Choroid Plexus:        Previously seen        Stomach:                Appears normal, left                                                                        sided  Cerebellum:            Previously seen        Abdomen:                Appears normal  Posterior Fossa:       Previously seen        Abdominal Wall:         Previously seen  Nuchal Fold:           Previously seen        Cord Vessels:           Appears normal (3  vessel cord)  Face:                  Orbits and profile     Kidneys:                Appear normal                         previously seen  Lips:                  Previously seen        Bladder:                Appears normal  Thoracic:              Appears normal         Spine:                  Limited views                                                                        appear normal  Heart:                 Appears normal         Upper Extremities:      Previously seen                         (4CH, axis, and                         situs)  RVOT:                  Appears normal         Lower Extremities:      Previously seen  LVOT:                  Appears normal  Other:  Technically difficult due to maternal habitus and fetal position. Heels          and 5th digit visualized. ---------------------------------------------------------------------- Comments  This patient was seen for a follow up growth scan due to a  history of chronic hypertension currently treated with  labetalol.  The patient underwent an exploratory laparotomy  with bilateral ovarian cystectomy about 3 weeks ago.  She  reports that she has been doing well since the surgery.  She  denies any problems since her last exam.  She was informed that the fetal growth continues to measure  large for her gestational age.  There was normal amniotic  fluid noted.  The views of the fetal anatomy continue to remain limited due  to the fetal position.  A follow up exam was scheduled in 4 weeks. ----------------------------------------------------------------------                   Johnell Comings, MD Electronically Signed Final Report   08/31/2019 04:53 pm ----------------------------------------------------------------------   Assessment and Plan:  Pregnancy: [id number] at [redacted]w[redacted]d 1. Supervision of high risk pregnancy, antepartum Patient is doing well without complaints  2. Hypertension affecting pregnancy,  antepartum Continue labetalol daily. Patient is only taking it at bedtime Patient  misplaced ASA and plans to replace it  3. Ovarian cyst affecting pregnancy in second trimester, antepartum S/p ex-lap  Preterm labor symptoms and general obstetric precautions including but not limited to vaginal bleeding, contractions, leaking of fluid and fetal movement were reviewed in detail with the patient. I discussed the assessment and treatment plan with the patient. The patient was provided an opportunity to ask questions and all were answered. The patient agreed with the plan and demonstrated an understanding of the instructions. The patient was advised to call back or seek an in-person office evaluation/go to MAU at [facility] for any urgent or concerning symptoms. Please refer to After Visit Summary for other counseling recommendations.   I provided 11 minutes of face-to-face time during this encounter.  Return in about 4 weeks (around 10/01/2019) for Virtual, ROB, High risk.  Future Appointments  Date Time Provider [place]  09/29/2019 10:45 AM [place] [facility]-US  09/29/2019 10:45 AM [facility]-MFC [place] 2 WH-MFCUS MFC-US    Mora Bellman, MD [facility] [employer], [place]

## 2019-09-09 ENCOUNTER — Telehealth: Payer: Self-pay | Admitting: Obstetrics & Gynecology

## 2019-09-09 NOTE — Telephone Encounter (Signed)
Returned call to pt and discussed her concerns at length. She stated that she returned to work yesterday after having surgery on 3/10. 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 She has been having shortness of breath, swelling of feet and ankles, contractions, feeling lightheaded and pain on her Lt side. She was not able to work an entire 8 hr shift today due to these issues and left work early. Pt reports that due to staffing shortages she is not able to follow the pregnancy restrictions as stated in the letter from Dr. Harolyn Rutherford on 3/8. She was on her feet for more than 3 consecutive hours today without a break and was required to assist with lifting a patient. She is wanting to know what she should do about work and is concerned about the pain she is having. Pt was advised that compression socks may help with the swelling of lower extremities. In regards to her other symptoms, I advised that I will send a message to Dr. Harolyn Rutherford and ask her advice. A staff member will contact her once a response is received.  Pt voiced understanding.

## 2019-09-09 NOTE — Telephone Encounter (Signed)
Patient is requesting a call back today. She has questions about her surgery she had.

## 2019-09-10 NOTE — Telephone Encounter (Signed)
Patient had a laparotomy for remove the ovarian cysts.  She may still have some pain as it has been about 4 weeks since surgery. If she feels she has a lot of pain and desires to be out of work for longer, she can be given an additional 4 weeks (total 8 weeks after surgery).  She is not cleared for lifting patients etc until 8 weeks after surgery.   Compression stockings can help with swelling in legs.  Please call to inform patient of recommendations.  [name], MD, [place] [facility] [employer], [place]

## 2019-09-13 NOTE — Telephone Encounter (Addendum)
Called pt and informed her of the response received from Dr. Harolyn Rutherford. Multiple options were discussed regarding work which would be approved by Dr. Harolyn Rutherford - such as:  1. Not working at all until 8 wks after surgery (5/5)   2. Decreasing the amount of hours she works in a particular shift to 4 or 6 hrs w/no lifting of pts until 5/5 3. Decreasing number of days she works in a week w/no lifting of pts until 5/5  Pt stated that she has some financial concerns. She was advised that she can decide what will work best for her physically and financially and let us know tomorrow. A new letter for her employer will then be composed. Pt also stated that she is having problems w/hemorrhoids. I advised her of acceptable OTC medications. Pt voiced understanding of all information and instructions and stated she will call back tomorrow.   4/20  1700 Spoke w/pt following a VM she left earlier today. She stated that she has decided to remain out of work for 8 weeks as offered by Dr. Harolyn Rutherford. I advised that I will compose an updated letter stating that she can return to work on 09/30/19. This letter will be placed in her MyChart account. Pt was advised that she may want to try using a Maternity support belt when she returns to work. She stated that she has ordered one and intends to try it. Pt voiced understanding of all information and instructions given. Marland Kitchen

## 2019-09-14 ENCOUNTER — Encounter: Payer: Self-pay | Admitting: Obstetrics & Gynecology

## 2019-09-25 ENCOUNTER — Inpatient Hospital Stay (HOSPITAL_COMMUNITY)
Admission: AD | Admit: 2019-09-25 | Discharge: 2019-09-25 | Disposition: A | Discharge status: Home / Self Care | Payer: Medicaid Other | Attending: Obstetrics and Gynecology | Admitting: Obstetrics and Gynecology

## 2019-09-25 ENCOUNTER — Encounter (HOSPITAL_COMMUNITY): Payer: Self-pay | Admitting: Obstetrics and Gynecology

## 2019-09-25 ENCOUNTER — Other Ambulatory Visit: Payer: Self-pay

## 2019-09-25 DIAGNOSIS — K5900 Constipation, unspecified: Secondary | ICD-10-CM | POA: Insufficient documentation

## 2019-09-25 DIAGNOSIS — F17210 Nicotine dependence, cigarettes, uncomplicated: Secondary | ICD-10-CM | POA: Insufficient documentation

## 2019-09-25 DIAGNOSIS — Z79899 Other long term (current) drug therapy: Secondary | ICD-10-CM | POA: Insufficient documentation

## 2019-09-25 DIAGNOSIS — O99612 Diseases of the digestive system complicating pregnancy, second trimester: Secondary | ICD-10-CM | POA: Insufficient documentation

## 2019-09-25 DIAGNOSIS — Z3A25 25 weeks gestation of pregnancy: Secondary | ICD-10-CM | POA: Diagnosis not present

## 2019-09-25 DIAGNOSIS — O10912 Unspecified pre-existing hypertension complicating pregnancy, second trimester: Secondary | ICD-10-CM | POA: Insufficient documentation

## 2019-09-25 DIAGNOSIS — Z3A34 34 weeks gestation of pregnancy: Secondary | ICD-10-CM

## 2019-09-25 DIAGNOSIS — O2242 Hemorrhoids in pregnancy, second trimester: Secondary | ICD-10-CM | POA: Diagnosis not present

## 2019-09-25 DIAGNOSIS — O99332 Smoking (tobacco) complicating pregnancy, second trimester: Secondary | ICD-10-CM | POA: Diagnosis not present

## 2019-09-25 DIAGNOSIS — Z7982 Long term (current) use of aspirin: Secondary | ICD-10-CM | POA: Insufficient documentation

## 2019-09-25 DIAGNOSIS — O2243 Hemorrhoids in pregnancy, third trimester: Secondary | ICD-10-CM | POA: Insufficient documentation

## 2019-09-25 MED ORDER — WITCH HAZEL-GLYCERIN EX PADS: 1.0000 "application " | MEDICATED_PAD | CUTANEOUS | 1 refills | Status: DC | PRN

## 2019-09-25 MED ORDER — LIDOCAINE HCL URETHRAL/MUCOSAL 2 % EX GEL
1.0000 "application " | Freq: Once | CUTANEOUS | Status: AC
  Administered 2019-09-25: 1
  Filled 2019-09-25: qty 5

## 2019-09-25 MED ORDER — HYDROCORT-PRAMOXINE (PERIANAL) 1-1 % EX FOAM: 1.0000 | Freq: Two times a day (BID) | CUTANEOUS | 1 refills | Status: DC

## 2019-09-25 MED ORDER — FLEET ENEMA 7-19 GM/118ML RE ENEM: 1.0000 | ENEMA | Freq: Once | RECTAL | Status: DC

## 2019-09-25 MED ORDER — MAGNESIUM CITRATE PO SOLN
0.5000 | Freq: Once | ORAL | Status: AC
  Administered 2019-09-25: 0.5 via ORAL
  Filled 2019-09-25: qty 296

## 2019-09-25 MED ORDER — POLYETHYLENE GLYCOL 3350 17 GM/SCOOP PO POWD: ORAL | 0 refills | Status: DC

## 2019-09-25 NOTE — MAU Note (Addendum)
Pt reports to MAU c/o hemorrhoid pain that started today. Pt reports cramping that is a 10/10. No vaginal bleeding. Pt reports some hot flashes. No FM today. Pt reports "sore legs on the back"

## 2019-09-25 NOTE — Discharge Instructions (Signed)

## 2019-09-25 NOTE — MAU Provider Note (Signed)
History     CSN: [id number]  Arrival date and time: 09/25/19 2035   First Provider Initiated Contact with Patient 09/25/19 2213      Chief Complaint  Patient presents with  . Hemorrhoids   Ann Bruce is a 34 y.o. [id number] at [redacted]w[redacted]d who receives care at [facility].  She presents today for Hemorrhoids.  She reports that she is having some intermittent cramping and feels that it is related to her bowels.  She states she has not had a bowel movement in about 6 days, but is eating regularly and has had no nausea or vomiting.  Patient reports that she also has hemorrhoids that she noticed today when trying to use the bathroom.  She states she takes stool softeners nightly and has drank prune juice without relief.       OB History    Gravida  3   Para  2   Term  1   Preterm  1   AB  0   Living  2     SAB  0   TAB  0   Ectopic  0   Multiple  0   Live Births  2           Past Medical History:  Diagnosis Date  . Dyspnea    with pregnancy - exertion  . Hypertension   . Ovarian cyst   . UTI (urinary tract infection)     Past Surgical History:  Procedure Laterality Date  . APPENDECTOMY    . LAPAROTOMY Bilateral 08/04/2019   Procedure: EXPLORATORY LAPAROTOMY WITH BILATERAL OVARIAN CYSTECTOMY;  Surgeon: Osborne Oman, MD;  Location: [facility];  Service: Gynecology;  Laterality: Bilateral;  Dr. Harolyn Rutherford will check fetal heart tones before and after surgery  . OVARIAN CYST SURGERY  2009  . VAGINAL DELIVERY  2009, 2007   x 2    Family History  Problem Relation Age of Onset  . Hypertension Mother   . Hypertension Father     Social History   Tobacco Use  . Smoking status: Current Every Day Smoker    Packs/day: 0.25    Years: 15.00    Pack years: 3.75    Types: Cigarettes  . Smokeless tobacco: Never Used  . Tobacco comment: smokes every 3 days  or when stresses, trying to quit  Substance Use Topics  . Alcohol use: Not Currently    Comment: occasional wine    . Drug use: Never    Allergies: No Known Allergies  Medications Prior to Admission  Medication Sig Dispense Refill Last Dose  . aspirin EC 81 MG tablet Take 1 tablet (81 mg total) by mouth daily. Take after 12 weeks for prevention of preeclampsia later in pregnancy 300 tablet 2 09/24/2019 at Unknown time  . labetalol (NORMODYNE) 200 MG tablet Take 1 tablet (200 mg total) by mouth 2 (two) times daily. 60 tablet 6 09/24/2019 at Unknown time  . Prenatal Vit-Fe Fumarate-FA (PRENATAL VITAMINS) 28-0.8 MG TABS Take 1 tablet by mouth daily. 30 tablet 12 09/24/2019 at Unknown time  . acetaminophen (TYLENOL) 500 MG tablet Take 1,000 mg by mouth every 6 (six) hours as needed for mild pain or headache.   Unknown at Unknown time  . aspirin 81 MG chewable tablet Chew 1 tablet (81 mg total) by mouth daily. (Patient not taking: Reported on 08/31/2019) 30 tablet 6   . Blood Pressure Monitoring DEVI 1 Device by Does not apply route once a week. 1 Device  0 Unknown at Unknown time  . cyclobenzaprine (FLEXERIL) 10 MG tablet Take 1 tablet (10 mg total) by mouth 3 (three) times daily as needed for muscle spasms. (Patient not taking: Reported on 08/31/2019) 30 tablet 2 Unknown at Unknown time  . senna-docusate (SENOKOT-S) 8.6-50 MG tablet Take 2 tablets by mouth at bedtime as needed for mild constipation or moderate constipation. 30 tablet 2 Unknown at Unknown time    Review of Systems  Constitutional: Negative for chills and fever.  Respiratory: Negative for cough and shortness of breath.   Gastrointestinal: Positive for abdominal pain (Cramping) and constipation. Negative for diarrhea, nausea and vomiting.  Genitourinary: Negative for dysuria, vaginal bleeding and vaginal discharge.  Neurological: Negative for dizziness, light-headedness and headaches.   Physical Exam   Blood pressure (!) 145/72, pulse 86, temperature 98.4 F (36.9 C), temperature source Oral, resp. rate 19, last menstrual period  04/03/2019.  Physical Exam  Constitutional: She is oriented to person, place, and time. She appears well-developed and well-nourished.  HENT:  Head: Normocephalic and atraumatic.  Eyes: Conjunctivae are normal.  Cardiovascular: Normal rate.  Respiratory: Effort normal.  GI: Soft.  Genitourinary: Rectum:     Tenderness and external hemorrhoid present.     Genitourinary Comments: Two hemorrhoids that are engorged, but without thrombosis or active bleeding. Nontender to touch. Good sphinchter tone.    Musculoskeletal:        General: Normal range of motion.     Cervical back: Normal range of motion.  Neurological: She is alert and oriented to person, place, and time.  Skin: Skin is warm and dry.  Psychiatric: She has a normal mood and affect. Her behavior is normal.    Fetal Assessment 140 bpm, Mod Var, -Decels, +10x10 Accels Toco: None  MAU Course  No results found for this or any previous visit (from the past 24 hour(s)). No results found.  MDM PE Labs: EFM  Assessment and Plan  34 year old [id number]  SIUP at 25weeks Cat I FT Hemorrhoids Constipation  -FHT reassuring for GA-EFM discontinued -POC reviewed -Exam performed and findings discussed. -Patient informed that due to extent of hemorrhoids she would likely not tolerate fleets enema. -Discussed with H. Sparacino who recommends Magnesium Citrate for administration her f/b miralax usage until stools are soft or for 2 weeks.  Also suggest possible general surgery consult for removal of recurrent hemorrhoids after delivery.  -Plan of care discussed with patient who is agreeable. Informed that GS consult would be determined by recurrence and extent of hemorrhoids, but would monitor.  Sticky note placed.  -Extensive education given on hemorrhoids and why they occur.  Discussed treatment at home.  -Will give lidocaine jelly now for usage at home. -Discussed home usage of Miralax as well as proctofoam and tucks pads for  relief of hemorrhoids.  -Will give half dose of Mag Citrate now and discharge to home.  -Informed that medication will take 4-8 hours to work and some abdominal cramping will occur.  -Patient encouraged to call nurse line for any questions or concerns that may arise. -Keep appts as scheduled which was reviewed with patient. -Encouraged to call or return to MAU if symptoms worsen or with the onset of new symptoms. -Discharged to home in stable condition.  Maryann Conners MSN, CNM 09/25/2019, 10:14 PM

## 2019-09-26 DIAGNOSIS — O2242 Hemorrhoids in pregnancy, second trimester: Secondary | ICD-10-CM | POA: Insufficient documentation

## 2019-09-29 ENCOUNTER — Ambulatory Visit (HOSPITAL_COMMUNITY): Payer: Medicaid Other | Attending: Obstetrics and Gynecology

## 2019-09-29 ENCOUNTER — Other Ambulatory Visit: Payer: Self-pay | Admitting: *Deleted

## 2019-09-29 ENCOUNTER — Other Ambulatory Visit: Payer: Self-pay

## 2019-09-29 ENCOUNTER — Ambulatory Visit: Payer: Medicaid Other | Admitting: *Deleted

## 2019-09-29 DIAGNOSIS — O10912 Unspecified pre-existing hypertension complicating pregnancy, second trimester: Secondary | ICD-10-CM | POA: Insufficient documentation

## 2019-09-29 DIAGNOSIS — E669 Obesity, unspecified: Secondary | ICD-10-CM

## 2019-09-29 DIAGNOSIS — O2692 Pregnancy related conditions, unspecified, second trimester: Secondary | ICD-10-CM

## 2019-09-29 DIAGNOSIS — O09522 Supervision of elderly multigravida, second trimester: Secondary | ICD-10-CM

## 2019-09-29 DIAGNOSIS — O169 Unspecified maternal hypertension, unspecified trimester: Secondary | ICD-10-CM | POA: Diagnosis present

## 2019-09-29 DIAGNOSIS — O99212 Obesity complicating pregnancy, second trimester: Secondary | ICD-10-CM

## 2019-09-29 DIAGNOSIS — Z362 Encounter for other antenatal screening follow-up: Secondary | ICD-10-CM | POA: Diagnosis not present

## 2019-09-29 DIAGNOSIS — O0990 Supervision of high risk pregnancy, unspecified, unspecified trimester: Secondary | ICD-10-CM | POA: Insufficient documentation

## 2019-09-29 DIAGNOSIS — O10012 Pre-existing essential hypertension complicating pregnancy, second trimester: Secondary | ICD-10-CM | POA: Diagnosis not present

## 2019-09-29 DIAGNOSIS — O10919 Unspecified pre-existing hypertension complicating pregnancy, unspecified trimester: Secondary | ICD-10-CM

## 2019-09-29 DIAGNOSIS — Z3A25 25 weeks gestation of pregnancy: Secondary | ICD-10-CM

## 2019-10-06 ENCOUNTER — Other Ambulatory Visit: Payer: Self-pay

## 2019-10-06 ENCOUNTER — Encounter: Payer: Self-pay | Admitting: Obstetrics and Gynecology

## 2019-10-06 ENCOUNTER — Telehealth (INDEPENDENT_AMBULATORY_CARE_PROVIDER_SITE_OTHER): Payer: Medicaid Other | Admitting: Obstetrics and Gynecology

## 2019-10-06 DIAGNOSIS — O162 Unspecified maternal hypertension, second trimester: Secondary | ICD-10-CM | POA: Diagnosis not present

## 2019-10-06 DIAGNOSIS — Z3A26 26 weeks gestation of pregnancy: Secondary | ICD-10-CM | POA: Diagnosis not present

## 2019-10-06 DIAGNOSIS — O0990 Supervision of high risk pregnancy, unspecified, unspecified trimester: Secondary | ICD-10-CM

## 2019-10-06 DIAGNOSIS — O169 Unspecified maternal hypertension, unspecified trimester: Secondary | ICD-10-CM

## 2019-10-06 NOTE — Progress Notes (Signed)
I connected with  Ann Bruce on 10/06/19 at  8:55 AM EDT by telephone and verified that I am speaking with the correct person using two identifiers.   I discussed the limitations, risks, security and privacy concerns of performing an evaluation and management service by telephone and the availability of in person appointments. I also discussed with the patient that there may be a patient responsible charge related to this service. The patient expressed understanding and agreed to proceed.  [name], RN 10/06/2019  8:54 AM

## 2019-10-06 NOTE — Progress Notes (Signed)
   TELEHEALTH VIRTUAL OBSTETRICS VISIT ENCOUNTER NOTE  I connected with Ann Bruce on 10/06/19 at  8:55 AM EDT by telephone at home and verified that I am speaking with the correct person using two identifiers.   I discussed the limitations, risks, security and privacy concerns of performing an evaluation and management service by telephone and the availability of in person appointments. I also discussed with the patient that there may be a patient responsible charge related to this service. The patient expressed understanding and agreed to proceed.  Subjective:  Ann Bruce is a 34 y.o. G3P1102 at [redacted]w[redacted]d being followed for ongoing prenatal care.  She is currently monitored for the following issues for this high-risk pregnancy and has Supervision of high risk pregnancy, antepartum; Hypertension affecting pregnancy, antepartum; and Hemorrhoids during pregnancy in second trimester on their problem list.  Patient reports general discomforts of pregnancy. Reports fetal movement. Denies any contractions, bleeding or leaking of fluid.   The following portions of the patient's history were reviewed and updated as appropriate: allergies, current medications, past family history, past medical history, past social history, past surgical history and problem list.   Objective:   General:  Alert, oriented and cooperative.   Mental Status: Normal mood and affect perceived. Normal judgment and thought content.  Rest of physical exam deferred due to type of encounter  Assessment and Plan:  Pregnancy: G3P1102 at [redacted]w[redacted]d 1. Hypertension affecting pregnancy, antepartum Stable Pt reports BP's at home 120's/80's Only taking Labetalol at night time U/S 09/29/19, f/u growth scheduled Antenatal testing reviewed with pt Note to front office to schedule weekly BPP/NST starting at 32 weeks  2. Supervision of high risk pregnancy, antepartum Stable Schedule appt for glucoala  Preterm labor symptoms and  general obstetric precautions including but not limited to vaginal bleeding, contractions, leaking of fluid and fetal movement were reviewed in detail with the patient.  I discussed the assessment and treatment plan with the patient. The patient was provided an opportunity to ask questions and all were answered. The patient agreed with the plan and demonstrated an understanding of the instructions. The patient was advised to call back or seek an in-person office evaluation/go to MAU at [facility] for any urgent or concerning symptoms. Please refer to After Visit Summary for other counseling recommendations.   I provided 8 minutes of non-face-to-face time during this encounter.  Return in about 2 weeks (around 10/20/2019) for OB visit, face to face, MD provider.  Future Appointments  Date Time Provider [place]  10/28/2019 10:45 AM [facility] NURSE [facility]  10/28/2019 10:45 AM [facility] US4 [facility]US [place]    [name], [place] for [place], [place]

## 2019-10-15 ENCOUNTER — Telehealth: Payer: Self-pay | Admitting: *Deleted

## 2019-10-15 ENCOUNTER — Other Ambulatory Visit: Payer: Medicaid Other

## 2019-10-15 ENCOUNTER — Other Ambulatory Visit: Payer: Self-pay | Admitting: General Practice

## 2019-10-15 DIAGNOSIS — O0990 Supervision of high risk pregnancy, unspecified, unspecified trimester: Secondary | ICD-10-CM

## 2019-10-15 NOTE — Telephone Encounter (Signed)
Pt left VM stating she has some questions related to work. Please call back

## 2019-10-19 NOTE — Telephone Encounter (Signed)
Returned patients call. LM for patient to call the office at her earliest convenience. Will send My Chart message.

## 2019-10-20 ENCOUNTER — Other Ambulatory Visit: Payer: Self-pay

## 2019-10-20 ENCOUNTER — Ambulatory Visit (INDEPENDENT_AMBULATORY_CARE_PROVIDER_SITE_OTHER): Payer: Medicaid Other | Admitting: Family Medicine

## 2019-10-20 VITALS — BP 125/76 | HR 90

## 2019-10-20 DIAGNOSIS — Z23 Encounter for immunization: Secondary | ICD-10-CM

## 2019-10-20 DIAGNOSIS — O169 Unspecified maternal hypertension, unspecified trimester: Secondary | ICD-10-CM

## 2019-10-20 DIAGNOSIS — Z3A28 28 weeks gestation of pregnancy: Secondary | ICD-10-CM

## 2019-10-20 DIAGNOSIS — O163 Unspecified maternal hypertension, third trimester: Secondary | ICD-10-CM

## 2019-10-20 DIAGNOSIS — O0993 Supervision of high risk pregnancy, unspecified, third trimester: Secondary | ICD-10-CM

## 2019-10-20 DIAGNOSIS — O0990 Supervision of high risk pregnancy, unspecified, unspecified trimester: Secondary | ICD-10-CM

## 2019-10-20 LAB — POCT URINALYSIS DIP (DEVICE)
Bilirubin Urine: NEGATIVE
Glucose, UA: 100 mg/dL — AB
Hgb urine dipstick: NEGATIVE
Ketones, ur: NEGATIVE mg/dL
Nitrite: NEGATIVE
Protein, ur: NEGATIVE mg/dL
Specific Gravity, Urine: 1.015 (ref 1.005–1.030)
Urobilinogen, UA: 0.2 mg/dL (ref 0.0–1.0)
pH: 6.5 (ref 5.0–8.0)

## 2019-10-20 NOTE — Patient Instructions (Signed)
Contraception Choices Contraception, also called birth control, refers to methods or devices that prevent pregnancy. Hormonal methods Contraceptive implant  A contraceptive implant is a thin, plastic tube that contains a hormone. It is inserted into the upper part of the arm. It can remain in place for up to 3 years. Progestin-only injections Progestin-only injections are injections of progestin, a synthetic form of the hormone progesterone. They are given every 3 months by a health care provider. Birth control pills  Birth control pills are pills that contain hormones that prevent pregnancy. They must be taken once a day, preferably at the same time each day. Birth control patch  The birth control patch contains hormones that prevent pregnancy. It is placed on the skin and must be changed once a week for three weeks and removed on the fourth week. A prescription is needed to use this method of contraception. Vaginal ring  A vaginal ring contains hormones that prevent pregnancy. It is placed in the vagina for three weeks and removed on the fourth week. After that, the process is repeated with a new ring. A prescription is needed to use this method of contraception. Emergency contraceptive Emergency contraceptives prevent pregnancy after unprotected sex. They come in pill form and can be taken up to 5 days after sex. They work best the sooner they are taken after having sex. Most emergency contraceptives are available without a prescription. This method should not be used as your only form of birth control. Barrier methods Female condom  A female condom is a thin sheath that is worn over the penis during sex. Condoms keep sperm from going inside a woman's body. They can be used with a spermicide to increase their effectiveness. They should be disposed after a single use. Female condom  A female condom is a soft, loose-fitting sheath that is put into the vagina before sex. The condom keeps  sperm from going inside a woman's body. They should be disposed after a single use. Diaphragm  A diaphragm is a soft, dome-shaped barrier. It is inserted into the vagina before sex, along with a spermicide. The diaphragm blocks sperm from entering the uterus, and the spermicide kills sperm. A diaphragm should be left in the vagina for 6-8 hours after sex and removed within 24 hours. A diaphragm is prescribed and fitted by a health care provider. A diaphragm should be replaced every 1-2 years, after giving birth, after gaining more than 15 lb (6.8 kg), and after pelvic surgery. Cervical cap  A cervical cap is a round, soft latex or plastic cup that fits over the cervix. It is inserted into the vagina before sex, along with spermicide. It blocks sperm from entering the uterus. The cap should be left in place for 6-8 hours after sex and removed within 48 hours. A cervical cap must be prescribed and fitted by a health care provider. It should be replaced every 2 years. Sponge  A sponge is a soft, circular piece of polyurethane foam with spermicide on it. The sponge helps block sperm from entering the uterus, and the spermicide kills sperm. To use it, you make it wet and then insert it into the vagina. It should be inserted before sex, left in for at least 6 hours after sex, and removed and thrown away within 30 hours. Spermicides Spermicides are chemicals that kill or block sperm from entering the cervix and uterus. They can come as a cream, jelly, suppository, foam, or tablet. A spermicide should be inserted into  the vagina with an applicator at least 10-15 minutes before sex to allow time for it to work. The process must be repeated every time you have sex. Spermicides do not require a prescription. Intrauterine contraception Intrauterine device (IUD) An IUD is a T-shaped device that is put in a woman's uterus. There are two types:  Hormone IUD.This type contains progestin, a synthetic form of the  hormone progesterone. This type can stay in place for 3-5 years.  Copper IUD.This type is wrapped in copper wire. It can stay in place for 10 years.  Permanent methods of contraception Female tubal ligation In this method, a woman's fallopian tubes are sealed, tied, or blocked during surgery to prevent eggs from traveling to the uterus. Hysteroscopic sterilization In this method, a small, flexible insert is placed into each fallopian tube. The inserts cause scar tissue to form in the fallopian tubes and block them, so sperm cannot reach an egg. The procedure takes about 3 months to be effective. Another form of birth control must be used during those 3 months. Female sterilization This is a procedure to tie off the tubes that carry sperm (vasectomy). After the procedure, the man can still ejaculate fluid (semen). Natural planning methods Natural family planning In this method, a couple does not have sex on days when the woman could become pregnant. Calendar method This means keeping track of the length of each menstrual cycle, identifying the days when pregnancy can happen, and not having sex on those days. Ovulation method In this method, a couple avoids sex during ovulation. Symptothermal method This method involves not having sex during ovulation. The woman typically checks for ovulation by watching changes in her temperature and in the consistency of cervical mucus. Post-ovulation method In this method, a couple waits to have sex until after ovulation. Summary  Contraception, also called birth control, means methods or devices that prevent pregnancy.  Hormonal methods of contraception include implants, injections, pills, patches, vaginal rings, and emergency contraceptives.  Barrier methods of contraception can include female condoms, female condoms, diaphragms, cervical caps, sponges, and spermicides.  There are two types of IUDs (intrauterine devices). An IUD can be put in a woman's  uterus to prevent pregnancy for 3-5 years.  Permanent sterilization can be done through a procedure for males, females, or both.  Natural family planning methods involve not having sex on days when the woman could become pregnant. This information is not intended to replace advice given to you by your health care provider. Make sure you discuss any questions you have with your health care provider. Document Revised: 05/15/2017 Document Reviewed: 06/15/2016 Elsevier Patient Education  2020 [employer]   Breastfeeding  Choosing to breastfeed is one of the best decisions you can make for yourself and your baby. A change in hormones during pregnancy causes your breasts to make breast milk in your milk-producing glands. Hormones prevent breast milk from being released before your baby is born. They also prompt milk flow after birth. Once breastfeeding has begun, thoughts of your baby, as well as his or her sucking or crying, can stimulate the release of milk from your milk-producing glands. Benefits of breastfeeding Research shows that breastfeeding offers many health benefits for infants and mothers. It also offers a cost-free and convenient way to feed your baby. For your baby  Your first milk (colostrum) helps your baby's digestive system to function better.  Special cells in your milk (antibodies) help your baby to fight off infections.  Breastfed babies are   less likely to develop asthma, allergies, obesity, or type 2 diabetes. They are also at lower risk for sudden infant death syndrome (SIDS).  Nutrients in breast milk are better able to meet your baby's needs compared to infant formula.  Breast milk improves your baby's brain development. For you  Breastfeeding helps to create a very special bond between you and your baby.  Breastfeeding is convenient. Breast milk costs nothing and is always available at the correct temperature.  Breastfeeding helps to burn calories. It helps you  to lose the weight that you gained during pregnancy.  Breastfeeding makes your uterus return faster to its size before pregnancy. It also slows bleeding (lochia) after you give birth.  Breastfeeding helps to lower your risk of developing type 2 diabetes, osteoporosis, rheumatoid arthritis, cardiovascular disease, and breast, ovarian, uterine, and endometrial cancer later in life. Breastfeeding basics Starting breastfeeding  Find a comfortable place to sit or lie down, with your neck and back well-supported.  Place a pillow or a rolled-up blanket under your baby to bring him or her to the level of your breast (if you are seated). Nursing pillows are specially designed to help support your arms and your baby while you breastfeed.  Make sure that your baby's tummy (abdomen) is facing your abdomen.  Gently massage your breast. With your fingertips, massage from the outer edges of your breast inward toward the nipple. This encourages milk flow. If your milk flows slowly, you may need to continue this action during the feeding.  Support your breast with 4 fingers underneath and your thumb above your nipple (make the letter "C" with your hand). Make sure your fingers are well away from your nipple and your baby's mouth.  Stroke your baby's lips gently with your finger or nipple.  When your baby's mouth is open wide enough, quickly bring your baby to your breast, placing your entire nipple and as much of the areola as possible into your baby's mouth. The areola is the colored area around your nipple. ? More areola should be visible above your baby's upper lip than below the lower lip. ? Your baby's lips should be opened and extended outward (flanged) to ensure an adequate, comfortable latch. ? Your baby's tongue should be between his or her lower gum and your breast.  Make sure that your baby's mouth is correctly positioned around your nipple (latched). Your baby's lips should create a seal on your  breast and be turned out (everted).  It is common for your baby to suck about 2-3 minutes in order to start the flow of breast milk. Latching Teaching your baby how to latch onto your breast properly is very important. An improper latch can cause nipple pain, decreased milk supply, and poor weight gain in your baby. Also, if your baby is not latched onto your nipple properly, he or she may swallow some air during feeding. This can make your baby fussy. Burping your baby when you switch breasts during the feeding can help to get rid of the air. However, teaching your baby to latch on properly is still the best way to prevent fussiness from swallowing air while breastfeeding. Signs that your baby has successfully latched onto your nipple  Silent tugging or silent sucking, without causing you pain. Infant's lips should be extended outward (flanged).  Swallowing heard between every 3-4 sucks once your milk has started to flow (after your let-down milk reflex occurs).  Muscle movement above and in front of his or her   ears while sucking. Signs that your baby has not successfully latched onto your nipple  Sucking sounds or smacking sounds from your baby while breastfeeding.  Nipple pain. If you think your baby has not latched on correctly, slip your finger into the corner of your baby's mouth to break the suction and place it between your baby's gums. Attempt to start breastfeeding again. Signs of successful breastfeeding Signs from your baby  Your baby will gradually decrease the number of sucks or will completely stop sucking.  Your baby will fall asleep.  Your baby's body will relax.  Your baby will retain a small amount of milk in his or her mouth.  Your baby will let go of your breast by himself or herself. Signs from you  Breasts that have increased in firmness, weight, and size 1-3 hours after feeding.  Breasts that are softer immediately after breastfeeding.  Increased milk  volume, as well as a change in milk consistency and color by the fifth day of breastfeeding.  Nipples that are not sore, cracked, or bleeding. Signs that your baby is getting enough milk  Wetting at least 1-2 diapers during the first 24 hours after birth.  Wetting at least 5-6 diapers every 24 hours for the first week after birth. The urine should be clear or pale yellow by the age of 5 days.  Wetting 6-8 diapers every 24 hours as your baby continues to grow and develop.  At least 3 stools in a 24-hour period by the age of 5 days. The stool should be soft and yellow.  At least 3 stools in a 24-hour period by the age of 7 days. The stool should be seedy and yellow.  No loss of weight greater than 10% of birth weight during the first 3 days of life.  Average weight gain of 4-7 oz (113-198 g) per week after the age of 4 days.  Consistent daily weight gain by the age of 5 days, without weight loss after the age of 2 weeks. After a feeding, your baby may spit up a small amount of milk. This is normal. Breastfeeding frequency and duration Frequent feeding will help you make more milk and can prevent sore nipples and extremely full breasts (breast engorgement). Breastfeed when you feel the need to reduce the fullness of your breasts or when your baby shows signs of hunger. This is called "breastfeeding on demand." Signs that your baby is hungry include:  Increased alertness, activity, or restlessness.  Movement of the head from side to side.  Opening of the mouth when the corner of the mouth or cheek is stroked (rooting).  Increased sucking sounds, smacking lips, cooing, sighing, or squeaking.  Hand-to-mouth movements and sucking on fingers or hands.  Fussing or crying. Avoid introducing a pacifier to your baby in the first 4-6 weeks after your baby is born. After this time, you may choose to use a pacifier. Research has shown that pacifier use during the first year of a baby's life  decreases the risk of sudden infant death syndrome (SIDS). Allow your baby to feed on each breast as long as he or she wants. When your baby unlatches or falls asleep while feeding from the first breast, offer the second breast. Because newborns are often sleepy in the first few weeks of life, you may need to awaken your baby to get him or her to feed. Breastfeeding times will vary from baby to baby. However, the following rules can serve as a guide to   help you make sure that your baby is properly fed:  Newborns (babies 4 weeks of age or younger) may breastfeed every 1-3 hours.  Newborns should not go without breastfeeding for longer than 3 hours during the day or 5 hours during the night.  You should breastfeed your baby a minimum of 8 times in a 24-hour period. Breast milk pumping     Pumping and storing breast milk allows you to make sure that your baby is exclusively fed your breast milk, even at times when you are unable to breastfeed. This is especially important if you go back to work while you are still breastfeeding, or if you are not able to be present during feedings. Your lactation consultant can help you find a method of pumping that works best for you and give you guidelines about how long it is safe to store breast milk. Caring for your breasts while you breastfeed Nipples can become dry, cracked, and sore while breastfeeding. The following recommendations can help keep your breasts moisturized and healthy:  Avoid using soap on your nipples.  Wear a supportive bra designed especially for nursing. Avoid wearing underwire-style bras or extremely tight bras (sports bras).  Air-dry your nipples for 3-4 minutes after each feeding.  Use only cotton bra pads to absorb leaked breast milk. Leaking of breast milk between feedings is normal.  Use lanolin on your nipples after breastfeeding. Lanolin helps to maintain your skin's normal moisture barrier. Pure lanolin is not harmful (not  toxic) to your baby. You may also hand express a few drops of breast milk and gently massage that milk into your nipples and allow the milk to air-dry. In the first few weeks after giving birth, some women experience breast engorgement. Engorgement can make your breasts feel heavy, warm, and tender to the touch. Engorgement peaks within 3-5 days after you give birth. The following recommendations can help to ease engorgement:  Completely empty your breasts while breastfeeding or pumping. You may want to start by applying warm, moist heat (in the shower or with warm, water-soaked hand towels) just before feeding or pumping. This increases circulation and helps the milk flow. If your baby does not completely empty your breasts while breastfeeding, pump any extra milk after he or she is finished.  Apply ice packs to your breasts immediately after breastfeeding or pumping, unless this is too uncomfortable for you. To do this: ? Put ice in a plastic bag. ? Place a towel between your skin and the bag. ? Leave the ice on for 20 minutes, 2-3 times a day.  Make sure that your baby is latched on and positioned properly while breastfeeding. If engorgement persists after 48 hours of following these recommendations, contact your health care provider or a lactation consultant. Overall health care recommendations while breastfeeding  Eat 3 healthy meals and 3 snacks every day. Well-nourished mothers who are breastfeeding need an additional 450-500 calories a day. You can meet this requirement by increasing the amount of a balanced diet that you eat.  Drink enough water to keep your urine pale yellow or clear.  Rest often, relax, and continue to take your prenatal vitamins to prevent fatigue, stress, and low vitamin and mineral levels in your body (nutrient deficiencies).  Do not use any products that contain nicotine or tobacco, such as cigarettes and e-cigarettes. Your baby may be harmed by chemicals from  cigarettes that pass into breast milk and exposure to secondhand smoke. If you need help quitting, ask your   health care provider.  Avoid alcohol.  Do not use illegal drugs or marijuana.  Talk with your health care provider before taking any medicines. These include over-the-counter and prescription medicines as well as vitamins and herbal supplements. Some medicines that may be harmful to your baby can pass through breast milk.  It is possible to become pregnant while breastfeeding. If birth control is desired, ask your health care provider about options that will be safe while breastfeeding your baby. Where to find more information: [employer]: www.llli.org Contact a health care provider if:  You feel like you want to stop breastfeeding or have become frustrated with breastfeeding.  Your nipples are cracked or bleeding.  Your breasts are red, tender, or warm.  You have: ? Painful breasts or nipples. ? A swollen area on either breast. ? A fever or chills. ? Nausea or vomiting. ? Drainage other than breast milk from your nipples.  Your breasts do not become full before feedings by the fifth day after you give birth.  You feel sad and depressed.  Your baby is: ? Too sleepy to eat well. ? Having trouble sleeping. ? More than 1 week old and wetting fewer than 6 diapers in a 24-hour period. ? Not gaining weight by 5 days of age.  Your baby has fewer than 3 stools in a 24-hour period.  Your baby's skin or the white parts of his or her eyes become yellow. Get help right away if:  Your baby is overly tired (lethargic) and does not want to wake up and feed.  Your baby develops an unexplained fever. Summary  Breastfeeding offers many health benefits for infant and mothers.  Try to breastfeed your infant when he or she shows early signs of hunger.  Gently tickle or stroke your baby's lips with your finger or nipple to allow the baby to open his or her mouth.  Bring the baby to your breast. Make sure that much of the areola is in your baby's mouth. Offer one side and burp the baby before you offer the other side.  Talk with your health care provider or lactation consultant if you have questions or you face problems as you breastfeed. This information is not intended to replace advice given to you by your health care provider. Make sure you discuss any questions you have with your health care provider. Document Revised: 08/07/2017 Document Reviewed: 06/14/2016 Elsevier Patient Education  2020 [employer] Inc.  

## 2019-10-20 NOTE — Progress Notes (Signed)
Subjective:  Ann Bruce is a 34 y.o. G3P1102 at [redacted]w[redacted]d being seen today for ongoing prenatal care.  She is currently monitored for the following issues for this high-risk pregnancy and has Supervision of high risk pregnancy, antepartum; Hypertension affecting pregnancy, antepartum; and Hemorrhoids during pregnancy in second trimester on their problem list.  Patient reports no complaints.  Contractions: Not present. Vag. Bleeding: None.  Movement: Present. Denies leaking of fluid.   The following portions of the patient's history were reviewed and updated as appropriate: allergies, current medications, past family history, past medical history, past social history, past surgical history and problem list. Problem list updated.  Objective:   Vitals:   10/20/19 1346  BP: 125/76  Pulse: 90    Fetal Status: Fetal Heart Rate (bpm): 154   Movement: Present     General:  Alert, oriented and cooperative. Patient is in no acute distress.  Skin: Skin is warm and dry. No rash noted.   Cardiovascular: Normal heart rate noted  Respiratory: Normal respiratory effort, no problems with respiration noted  Abdomen: Soft, gravid, appropriate for gestational age. Pain/Pressure: Absent     Pelvic: Vag. Bleeding: None     Cervical exam deferred        Extremities: Normal range of motion.  Edema: None  Mental Status: Normal mood and affect. Normal behavior. Normal judgment and thought content.   Urinalysis:      Assessment and Plan:  Pregnancy: G3P1102 at [redacted]w[redacted]d  1. Supervision of high risk pregnancy, antepartum Stable 28wk labs ordered but for some reason not collected, has appt next week for antenatal testing, draw at that time Very pressed for time due to son being let out early from school, did not have time to discuss BTL in detail Will discuss at next visit w Dr. 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 Hypertension affecting pregnancy, antepartum BP's well controlled, asymptomatic Reports not taking labatelol as prescribed, only taking 200mg  once daily in the AM Encouraged adherence to usual regimen  Preterm labor symptoms and general obstetric precautions including but not limited to vaginal bleeding, contractions, leaking of fluid and fetal movement were reviewed in detail with the patient. Please refer to After Visit Summary for other counseling recommendations.  Return in 2 weeks (on 11/03/2019) for [facility], needs MD.   Clarnce Flock, MD

## 2019-10-21 ENCOUNTER — Encounter: Payer: Self-pay | Admitting: *Deleted

## 2019-10-28 ENCOUNTER — Ambulatory Visit: Payer: Medicaid Other | Admitting: *Deleted

## 2019-10-28 ENCOUNTER — Other Ambulatory Visit: Payer: Self-pay | Admitting: *Deleted

## 2019-10-28 ENCOUNTER — Other Ambulatory Visit: Payer: Medicaid Other

## 2019-10-28 ENCOUNTER — Ambulatory Visit: Payer: Medicaid Other | Attending: Obstetrics and Gynecology

## 2019-10-28 ENCOUNTER — Other Ambulatory Visit: Payer: Self-pay

## 2019-10-28 DIAGNOSIS — O10919 Unspecified pre-existing hypertension complicating pregnancy, unspecified trimester: Secondary | ICD-10-CM | POA: Insufficient documentation

## 2019-10-28 DIAGNOSIS — O10013 Pre-existing essential hypertension complicating pregnancy, third trimester: Secondary | ICD-10-CM | POA: Diagnosis not present

## 2019-10-28 DIAGNOSIS — O09523 Supervision of elderly multigravida, third trimester: Secondary | ICD-10-CM

## 2019-10-28 DIAGNOSIS — E669 Obesity, unspecified: Secondary | ICD-10-CM

## 2019-10-28 DIAGNOSIS — Z362 Encounter for other antenatal screening follow-up: Secondary | ICD-10-CM

## 2019-10-28 DIAGNOSIS — Z3A29 29 weeks gestation of pregnancy: Secondary | ICD-10-CM

## 2019-10-28 DIAGNOSIS — O2693 Pregnancy related conditions, unspecified, third trimester: Secondary | ICD-10-CM | POA: Diagnosis not present

## 2019-10-28 DIAGNOSIS — O0990 Supervision of high risk pregnancy, unspecified, unspecified trimester: Secondary | ICD-10-CM | POA: Diagnosis present

## 2019-10-28 DIAGNOSIS — O169 Unspecified maternal hypertension, unspecified trimester: Secondary | ICD-10-CM | POA: Diagnosis present

## 2019-10-28 DIAGNOSIS — O99213 Obesity complicating pregnancy, third trimester: Secondary | ICD-10-CM

## 2019-10-29 LAB — CBC
Hematocrit: 32 % — ABNORMAL LOW (ref 34.0–46.6)
Hemoglobin: 11.2 g/dL (ref 11.1–15.9)
MCH: 31 pg (ref 26.6–33.0)
MCHC: 35 g/dL (ref 31.5–35.7)
MCV: 89 fL (ref 79–97)
Platelets: 238 10*3/uL (ref 150–450)
RBC: 3.61 x10E6/uL — ABNORMAL LOW (ref 3.77–5.28)
RDW: 12.2 % (ref 11.7–15.4)
WBC: 5.8 10*3/uL (ref 3.4–10.8)

## 2019-10-29 LAB — GLUCOSE TOLERANCE, 2 HOURS W/ 1HR
Glucose, 1 hour: 134 mg/dL (ref 65–179)
Glucose, 2 hour: 115 mg/dL (ref 65–152)
Glucose, Fasting: 75 mg/dL (ref 65–91)

## 2019-10-29 LAB — HIV ANTIBODY (ROUTINE TESTING W REFLEX): HIV Screen 4th Generation wRfx: NONREACTIVE

## 2019-10-29 LAB — RPR: RPR Ser Ql: NONREACTIVE

## 2019-11-11 ENCOUNTER — Ambulatory Visit (HOSPITAL_COMMUNITY): Payer: Medicaid Other

## 2019-11-11 ENCOUNTER — Inpatient Hospital Stay (HOSPITAL_COMMUNITY)
Admission: AD | Admit: 2019-11-11 | Discharge: 2019-11-11 | Disposition: A | Discharge status: Home / Self Care | Payer: Medicaid Other | Attending: Obstetrics & Gynecology | Admitting: Obstetrics & Gynecology

## 2019-11-11 ENCOUNTER — Encounter (HOSPITAL_COMMUNITY): Payer: Self-pay | Admitting: Obstetrics & Gynecology

## 2019-11-11 ENCOUNTER — Inpatient Hospital Stay (HOSPITAL_BASED_OUTPATIENT_CLINIC_OR_DEPARTMENT_OTHER): Payer: Medicaid Other

## 2019-11-11 ENCOUNTER — Other Ambulatory Visit: Payer: Self-pay

## 2019-11-11 DIAGNOSIS — M79609 Pain in unspecified limb: Secondary | ICD-10-CM

## 2019-11-11 DIAGNOSIS — O99891 Other specified diseases and conditions complicating pregnancy: Secondary | ICD-10-CM | POA: Diagnosis not present

## 2019-11-11 DIAGNOSIS — O368130 Decreased fetal movements, third trimester, not applicable or unspecified: Secondary | ICD-10-CM

## 2019-11-11 DIAGNOSIS — O163 Unspecified maternal hypertension, third trimester: Secondary | ICD-10-CM

## 2019-11-11 DIAGNOSIS — O368190 Decreased fetal movements, unspecified trimester, not applicable or unspecified: Secondary | ICD-10-CM

## 2019-11-11 DIAGNOSIS — M79604 Pain in right leg: Secondary | ICD-10-CM

## 2019-11-11 DIAGNOSIS — O169 Unspecified maternal hypertension, unspecified trimester: Secondary | ICD-10-CM

## 2019-11-11 DIAGNOSIS — Z3A31 31 weeks gestation of pregnancy: Secondary | ICD-10-CM | POA: Diagnosis not present

## 2019-11-11 DIAGNOSIS — M79605 Pain in left leg: Secondary | ICD-10-CM | POA: Diagnosis not present

## 2019-11-11 DIAGNOSIS — Z3689 Encounter for other specified antenatal screening: Secondary | ICD-10-CM

## 2019-11-11 LAB — COMPREHENSIVE METABOLIC PANEL
ALT: 29 U/L (ref 0–44)
AST: 101 U/L — ABNORMAL HIGH (ref 15–41)
Albumin: 2.8 g/dL — ABNORMAL LOW (ref 3.5–5.0)
Alkaline Phosphatase: 53 U/L (ref 38–126)
Anion gap: 8 (ref 5–15)
BUN: 5 mg/dL — ABNORMAL LOW (ref 6–20)
CO2: 24 mmol/L (ref 22–32)
Calcium: 8.5 mg/dL — ABNORMAL LOW (ref 8.9–10.3)
Chloride: 105 mmol/L (ref 98–111)
Creatinine, Ser: 0.55 mg/dL (ref 0.44–1.00)
GFR calc Af Amer: >60 mL/min (ref >60)
GFR calc non Af Amer: >60 mL/min (ref >60)
Glucose, Bld: 102 mg/dL — ABNORMAL HIGH (ref 70–99)
Potassium: 3.4 mmol/L — ABNORMAL LOW (ref 3.5–5.1)
Sodium: 137 mmol/L (ref 135–145)
Total Bilirubin: 0.4 mg/dL (ref 0.3–1.2)
Total Protein: 5.8 g/dL — ABNORMAL LOW (ref 6.5–8.1)

## 2019-11-11 LAB — URINALYSIS, ROUTINE W REFLEX MICROSCOPIC
Bilirubin Urine: NEGATIVE
Glucose, UA: NEGATIVE mg/dL
Hgb urine dipstick: NEGATIVE
Ketones, ur: NEGATIVE mg/dL
Nitrite: NEGATIVE
Protein, ur: NEGATIVE mg/dL
Specific Gravity, Urine: 1.017 (ref 1.005–1.030)
pH: 6 (ref 5.0–8.0)

## 2019-11-11 LAB — CBC
HCT: 31.9 % — ABNORMAL LOW (ref 36.0–46.0)
Hemoglobin: 10.6 g/dL — ABNORMAL LOW (ref 12.0–15.0)
MCH: 30.7 pg (ref 26.0–34.0)
MCHC: 33.2 g/dL (ref 30.0–36.0)
MCV: 92.5 fL (ref 80.0–100.0)
Platelets: 221 10*3/uL (ref 150–400)
RBC: 3.45 MIL/uL — ABNORMAL LOW (ref 3.87–5.11)
RDW: 13.4 % (ref 11.5–15.5)
WBC: 5.1 10*3/uL (ref 4.0–10.5)
nRBC: 0 % (ref 0.0–0.2)

## 2019-11-11 LAB — PROTEIN / CREATININE RATIO, URINE
Creatinine, Urine: 149.64 mg/dL
Protein Creatinine Ratio: 0.17 mg/mg{Cre} — ABNORMAL HIGH (ref 0.00–0.15)
Total Protein, Urine: 26 mg/dL

## 2019-11-11 MED ORDER — ACETAMINOPHEN 500 MG PO TABS
1000.0000 mg | ORAL_TABLET | Freq: Once | ORAL | Status: AC
  Administered 2019-11-11: 1000 mg via ORAL
  Filled 2019-11-11: qty 2

## 2019-11-11 NOTE — Discharge Instructions (Signed)
LEG CRAMPS -- Leg cramps are common, usually occurring during the latter half of pregnancy. The cramps are due to painful muscle contractions and are generally experienced in the calves at night. They are thought to be secondary to a buildup of lactic and pyruvic acids leading to involuntary contraction of the affected muscles, but the exact etiology is unknown.  A Cochrane review found that the only placebo-controlled trial of calcium treatment showed no evidence of benefit in the treatment of leg cramps, but placebo controlled trials of magnesium supplementation suggested a possible benefit (odds ratio 0.18, 95 percent CI 0.05 to 0.60). This was a small trial of 69 pregnant women with persistent leg cramps. The preparation used was magnesium lactate or citrate 5 mmol in the morning and 10 mmol in the evening.    Stretching exercises may be an effective preventive measure. These can be performed in the weight-bearing position; they are held for 20 seconds and repeated three times in succession, four times daily for one week, then twice daily thereafter.  If a cramp occurs, calf stretches (toe raises), walking, or leg jiggling followed by leg elevation may be helpful. Other nonpharmacologic remedies include: ?A hot shower or warm tub bath ?Ice massage ?Regular exercise for conditioning, calf strengthening and stretching ?Increased hydration ?Use of long-countered shoes and other proper foot gear.   

## 2019-11-11 NOTE — Progress Notes (Signed)
Venous duplex       has been completed. Preliminary results can be found under CV proc through chart review. Hosteen Kienast, BS, RDMS, RVT   

## 2019-11-11 NOTE — MAU Note (Signed)
All last night, was having pain in her legs, "like she worked out", still going on .  Talked to a nurse, she told her to come in. Hasn't felt baby since last night.

## 2019-11-11 NOTE — MAU Note (Signed)
Pt complains of bilateral leg pain in thighs and side, starting last night. Pt complains of DFM from 2200 6/16. Denies LOF or bleeding. Denies contractions

## 2019-11-11 NOTE — MAU Provider Note (Signed)
History     CSN: [id number]  Arrival date and time: 11/11/19 1402   None     Chief Complaint  Patient presents with  . Decreased Fetal Movement  . Leg Pain   HPI   [name] is a 34 y.o. Female [id number] @ [redacted]w[redacted]d here with bilateral leg pain. The pain is worse in the right leg and near the thigh area. She feels like yesterday she over did her activity. States the pain kept her awake last night d/t the pain. The pain feels like a soreness or tightness. The pain is now a 7/10. History of CHTN; taking labetalol, unsure whether she is taking 100 mg or 200 mg PO. No headaches or changes in vision. She has not taken any pain medication for the pain.   OB History    Gravida  3   Para  2   Term  1   Preterm  1   AB  0   Living  2     SAB  0   TAB  0   Ectopic  0   Multiple  0   Live Births  2           Past Medical History:  Diagnosis Date  . Dyspnea    with pregnancy - exertion  . Hypertension   . Ovarian cyst   . UTI (urinary tract infection)     Past Surgical History:  Procedure Laterality Date  . APPENDECTOMY    . LAPAROTOMY Bilateral 08/04/2019   Procedure: EXPLORATORY LAPAROTOMY WITH BILATERAL OVARIAN CYSTECTOMY;  Surgeon: Osborne Oman, MD;  Location: [facility];  Service: Gynecology;  Laterality: Bilateral;  Dr. Harolyn Rutherford will check fetal heart tones before and after surgery  . OVARIAN CYST SURGERY  2009  . VAGINAL DELIVERY  2009, 2007   x 2    Family History  Problem Relation Age of Onset  . Hypertension Mother   . Hypertension Father     Social History   Tobacco Use  . Smoking status: Former Smoker    Packs/day: 0.25    Years: 15.00    Pack years: 3.75    Types: Cigarettes  . Smokeless tobacco: Never Used  . Tobacco comment: smokes every 3 days  or when stresses, trying to quit  Vaping Use  . Vaping Use: Never used  Substance Use Topics  . Alcohol use: Not Currently    Comment: occasional wine   . Drug use: Never     Allergies: No Known Allergies  No medications prior to admission.   Results for orders placed or performed during the hospital encounter of 11/11/19 (from the past 48 hour(s))  Urinalysis, Routine w reflex microscopic     Status: Abnormal   Collection Time: 11/11/19  3:05 PM  Result Value Ref Range   Color, Urine YELLOW YELLOW   APPearance HAZY (A) CLEAR   Specific Gravity, Urine 1.017 1.005 - 1.030   pH 6.0 5.0 - 8.0   Glucose, UA NEGATIVE NEGATIVE mg/dL   Hgb urine dipstick NEGATIVE NEGATIVE   Bilirubin Urine NEGATIVE NEGATIVE   Ketones, ur NEGATIVE NEGATIVE mg/dL   Protein, ur NEGATIVE NEGATIVE mg/dL   Nitrite NEGATIVE NEGATIVE   Leukocytes,Ua TRACE (A) NEGATIVE   RBC / HPF 0-5 0 - 5 RBC/hpf   WBC, UA 0-5 0 - 5 WBC/hpf   Bacteria, UA RARE (A) NONE SEEN   Squamous Epithelial / LPF 11-20 0 - 5   Mucus PRESENT  Comment: Performed at [facility], [place] [address], [place], [place] [address]  OB Urine Culture     Status: Abnormal (Preliminary result)   Collection Time: 11/11/19  3:05 PM   Specimen: OB Clean Catch; Urine  Result Value Ref Range   Specimen Description OB CLEAN CATCH    Special Requests NONE    Culture (A)     >=100,000 COLONIES/mL ENTEROCOCCUS FAECALIS CULTURE REINCUBATED FOR BETTER GROWTH SUSCEPTIBILITIES TO FOLLOW Performed at [facility], [address], [place], [place] [address]    Report Status PENDING   CBC     Status: Abnormal   Collection Time: 11/11/19  4:10 PM  Result Value Ref Range   WBC 5.1 4.0 - 10.5 K/uL   RBC 3.45 (L) 3.87 - 5.11 MIL/uL   Hemoglobin 10.6 (L) 12.0 - 15.0 g/dL   HCT 31.9 (L) 36 - 46 %   MCV 92.5 80.0 - 100.0 fL   MCH 30.7 26.0 - 34.0 pg   MCHC 33.2 30.0 - 36.0 g/dL   RDW 13.4 11.5 - 15.5 %   Platelets 221 150 - 400 K/uL   nRBC 0.0 0.0 - 0.2 %    Comment: Performed at [facility], [place] [address], [place], [place] [address]  Comprehensive metabolic panel     Status: Abnormal   Collection Time:  11/11/19  4:10 PM  Result Value Ref Range   Sodium 137 135 - 145 mmol/L   Potassium 3.4 (L) 3.5 - 5.1 mmol/L   Chloride 105 98 - 111 mmol/L   CO2 24 22 - 32 mmol/L   Glucose, Bld 102 (H) 70 - 99 mg/dL    Comment: Glucose reference range applies only to samples taken after fasting for at least 8 hours.   BUN 5 (L) 6 - 20 mg/dL   Creatinine, Ser 0.55 0.44 - 1.00 mg/dL   Calcium 8.5 (L) 8.9 - 10.3 mg/dL   Total Protein 5.8 (L) 6.5 - 8.1 g/dL   Albumin 2.8 (L) 3.5 - 5.0 g/dL   AST 101 (H) 15 - 41 U/L   ALT 29 0 - 44 U/L   Alkaline Phosphatase 53 38 - 126 U/L   Total Bilirubin 0.4 0.3 - 1.2 mg/dL   GFR calc non Af Amer >60 >60 mL/min   GFR calc Af Amer >60 >60 mL/min   Anion gap 8 5 - 15    Comment: Performed at [facility] [address] [place], [place] [address]  Protein / creatinine ratio, urine     Status: Abnormal   Collection Time: 11/11/19  4:30 PM  Result Value Ref Range   Creatinine, Urine 149.64 mg/dL   Total Protein, Urine 26 mg/dL    Comment: NO NORMAL RANGE ESTABLISHED FOR THIS TEST   Protein Creatinine Ratio 0.17 (H) 0.00 - 0.15 mg/mg[Cre]    Comment: Performed at [facility] [address], [place], [place] [address]   Review of Systems  Respiratory: Negative for chest tightness and shortness of breath.   Cardiovascular: Negative for chest pain.  Musculoskeletal: Positive for arthralgias.   Physical Exam   Blood pressure 129/76, pulse 87, temperature 98.8 F (37.1 C), temperature source Oral, resp. rate 18, height 5\' 8"  (1.727 m), weight 88.9 kg, last menstrual period 04/03/2019, SpO2 100 %.   No data found.   No data found.  Physical Exam  Constitutional:  Non-toxic appearance. She does not appear ill. No distress.  HENT:  Head: Normocephalic.  Cardiovascular: Normal rate.  Respiratory: Effort normal.  GI: Normal appearance.  Musculoskeletal:     Cervical back: Normal range of motion.     Right lower leg: Normal. No swelling or  tenderness. No edema.     Left lower leg: Normal. No swelling or tenderness. No edema.  Neurological: She is alert.  Skin: She is not diaphoretic.  Psychiatric: Her behavior is normal. Mood normal.   Fetal Tracing: Baseline: 130 bpm Variability: moderate  Accelerations: 15x15 Decelerations: None Toco: UI  MAU Course  Procedures  None  MDM  PIH labs collected Labile BP's- not consistently taking BP as prescribed. BP normal at discharge. She is without symptoms. She has follow up early next week in the office and will have BP check.  Vascular US study negative for DVT- she is without SOB.   Assessment and Plan   A:  1. [redacted] weeks gestation of pregnancy   2. Hypertension affecting pregnancy, antepartum   3. Leg pain, bilateral   4. Decreased fetal movement during pregnancy, antepartum, single or unspecified fetus   5. NST (non-stress test) reactive      P  Discharge home in stable condition Kick counts- reactive NST, patient reassured Return to MAU if symptoms worsen Keep your appt next week for BP check Continue ASA Take your BP medications as prescribed Leg cramp info given to patient.    Noni Saupe I, NP 11/13/2019 8:22 AM

## 2019-11-13 LAB — CULTURE, OB URINE: Culture: >=100000 — AB

## 2019-11-14 ENCOUNTER — Other Ambulatory Visit: Payer: Self-pay | Admitting: Advanced Practice Midwife

## 2019-11-14 DIAGNOSIS — O2343 Unspecified infection of urinary tract in pregnancy, third trimester: Secondary | ICD-10-CM

## 2019-11-14 DIAGNOSIS — N390 Urinary tract infection, site not specified: Secondary | ICD-10-CM

## 2019-11-14 MED ORDER — AMOXICILLIN 500 MG PO CAPS: 500.0000 mg | ORAL_CAPSULE | Freq: Three times a day (TID) | ORAL | 0 refills | Status: DC

## 2019-11-14 NOTE — Progress Notes (Signed)
MyChart message sent to pt regarding positive urine culture from MAU visit 11/11/19.  Rx for Amoxicillin 500 mg TID x 7 days sent to pharmacy.

## 2019-11-17 ENCOUNTER — Ambulatory Visit (INDEPENDENT_AMBULATORY_CARE_PROVIDER_SITE_OTHER): Payer: Medicaid Other | Admitting: Family Medicine

## 2019-11-17 ENCOUNTER — Ambulatory Visit: Payer: Self-pay

## 2019-11-17 ENCOUNTER — Other Ambulatory Visit: Payer: Self-pay

## 2019-11-17 ENCOUNTER — Ambulatory Visit (INDEPENDENT_AMBULATORY_CARE_PROVIDER_SITE_OTHER): Payer: Medicaid Other | Admitting: General Practice

## 2019-11-17 VITALS — BP 115/75 | HR 90 | Wt 194.7 lb

## 2019-11-17 DIAGNOSIS — O169 Unspecified maternal hypertension, unspecified trimester: Secondary | ICD-10-CM

## 2019-11-17 DIAGNOSIS — O0993 Supervision of high risk pregnancy, unspecified, third trimester: Secondary | ICD-10-CM

## 2019-11-17 DIAGNOSIS — O163 Unspecified maternal hypertension, third trimester: Secondary | ICD-10-CM

## 2019-11-17 DIAGNOSIS — O10913 Unspecified pre-existing hypertension complicating pregnancy, third trimester: Secondary | ICD-10-CM | POA: Diagnosis not present

## 2019-11-17 DIAGNOSIS — O0990 Supervision of high risk pregnancy, unspecified, unspecified trimester: Secondary | ICD-10-CM

## 2019-11-17 DIAGNOSIS — Z3A32 32 weeks gestation of pregnancy: Secondary | ICD-10-CM

## 2019-11-17 LAB — POCT URINALYSIS DIP (DEVICE)
Bilirubin Urine: NEGATIVE
Bilirubin Urine: NEGATIVE
Glucose, UA: NEGATIVE mg/dL
Glucose, UA: NEGATIVE mg/dL
Hgb urine dipstick: NEGATIVE
Hgb urine dipstick: NEGATIVE
Ketones, ur: 15 mg/dL — AB
Nitrite: NEGATIVE
Nitrite: NEGATIVE
Protein, ur: 30 mg/dL — AB
Protein, ur: NEGATIVE mg/dL
Specific Gravity, Urine: 1.025 (ref 1.005–1.030)
Specific Gravity, Urine: 1.025 (ref 1.005–1.030)
Urobilinogen, UA: 1 mg/dL (ref 0.0–1.0)
Urobilinogen, UA: 1 mg/dL (ref 0.0–1.0)
pH: 6.5 (ref 5.0–8.0)
pH: 6.5 (ref 5.0–8.0)

## 2019-11-17 MED ORDER — AMOXICILLIN 875 MG PO TABS
875.0000 mg | ORAL_TABLET | Freq: Two times a day (BID) | ORAL | 0 refills | Status: DC
Start: 2019-11-17 — End: 2019-12-06

## 2019-11-17 NOTE — Progress Notes (Signed)
Pt informed that the ultrasound is considered a limited OB ultrasound and is not intended to be a complete ultrasound exam.  Patient also informed that the ultrasound is not being completed with the intent of assessing for fetal or placental anomalies or any pelvic abnormalities.  Explained that the purpose of today's ultrasound is to assess for  BPP, presentation and AFI.  Patient acknowledges the purpose of the exam and the limitations of the study.     Koren Bound RN BSN 11/17/19

## 2019-11-17 NOTE — Progress Notes (Signed)
   PRENATAL VISIT NOTE  Subjective:  Ann Bruce is a 34 y.o. G3P1102 at [redacted]w[redacted]d being seen today for ongoing prenatal care.  She is currently monitored for the following issues for this high-risk pregnancy and has Supervision of high risk pregnancy, antepartum; Hypertension affecting pregnancy, antepartum; and Hemorrhoids during pregnancy in second trimester on their problem list.  Patient reports no complaints.  Contractions: Not present. Vag. Bleeding: None.  Movement: Present. Denies leaking of fluid.   The following portions of the patient's history were reviewed and updated as appropriate: allergies, current medications, past family history, past medical history, past social history, past surgical history and problem list.   Objective:   Vitals:   11/17/19 0944  BP: 115/75  Pulse: 90  Weight: 194 lb 11.2 oz (88.3 kg)    Fetal Status: Fetal Heart Rate (bpm): 145 Fundal Height: 32 cm Movement: Present     General:  Alert, oriented and cooperative. Patient is in no acute distress.  Skin: Skin is warm and dry. No rash noted.   Cardiovascular: Normal heart rate noted  Respiratory: Normal respiratory effort, no problems with respiration noted  Abdomen: Soft, gravid, appropriate for gestational age.  Pain/Pressure: Present     Pelvic: Cervical exam deferred        Extremities: Normal range of motion.  Edema: None  Mental Status: Normal mood and affect. Normal behavior. Normal judgment and thought content.   Assessment and Plan:  Pregnancy: G3P1102 at [redacted]w[redacted]d 1. Supervision of high risk pregnancy, antepartum FHT and FH normal  2. Hypertension affecting pregnancy, antepartum BP controlled BPP today Growth next week On ASA 81mg  daily  Preterm labor symptoms and general obstetric precautions including but not limited to vaginal bleeding, contractions, leaking of fluid and fetal movement were reviewed in detail with the patient. Please refer to After Visit Summary for other  counseling recommendations.   Return in about 2 weeks (around 12/01/2019) for OB f/u.  Future Appointments  Date Time Provider [place]  11/17/2019 10:15 AM [facility] NST [place] [facility]  11/17/2019 11:15 AM [facility] US1 [facility]  11/18/2019 10:45 AM [facility] NURSE [facility]  11/18/2019 10:45 AM [facility] US5 [facility]  11/26/2019 10:45 AM [facility] NURSE [facility]  11/26/2019 10:45 AM [facility] US4 [facility]  12/03/2019 10:45 AM [facility] NURSE [facility]  12/03/2019 10:45 AM [facility] US4 [facility]  12/10/2019 10:45 AM [facility] NURSE [facility]  12/10/2019 10:45 AM [facility] US4 [facility] [place], DO

## 2019-11-18 ENCOUNTER — Ambulatory Visit: Payer: Medicaid Other

## 2019-11-19 ENCOUNTER — Telehealth (INDEPENDENT_AMBULATORY_CARE_PROVIDER_SITE_OTHER): Payer: Medicaid Other

## 2019-11-19 DIAGNOSIS — O0990 Supervision of high risk pregnancy, unspecified, unspecified trimester: Secondary | ICD-10-CM

## 2019-11-19 NOTE — Telephone Encounter (Signed)
VM left on nurse line requesting to sign BTL papers. Called pt to ask her to come into the office sometime next week to sign papers. Pt states she will come into the front office on Monday to sign papers.

## 2019-11-26 ENCOUNTER — Other Ambulatory Visit: Payer: Self-pay

## 2019-11-26 ENCOUNTER — Ambulatory Visit: Payer: Medicaid Other | Admitting: *Deleted

## 2019-11-26 ENCOUNTER — Ambulatory Visit: Payer: Medicaid Other | Attending: Obstetrics and Gynecology

## 2019-11-26 ENCOUNTER — Other Ambulatory Visit: Payer: Self-pay | Admitting: Obstetrics and Gynecology

## 2019-11-26 DIAGNOSIS — O169 Unspecified maternal hypertension, unspecified trimester: Secondary | ICD-10-CM

## 2019-11-26 DIAGNOSIS — O2693 Pregnancy related conditions, unspecified, third trimester: Secondary | ICD-10-CM

## 2019-11-26 DIAGNOSIS — O99213 Obesity complicating pregnancy, third trimester: Secondary | ICD-10-CM

## 2019-11-26 DIAGNOSIS — O09523 Supervision of elderly multigravida, third trimester: Secondary | ICD-10-CM | POA: Diagnosis not present

## 2019-11-26 DIAGNOSIS — O10013 Pre-existing essential hypertension complicating pregnancy, third trimester: Secondary | ICD-10-CM | POA: Diagnosis not present

## 2019-11-26 DIAGNOSIS — O0990 Supervision of high risk pregnancy, unspecified, unspecified trimester: Secondary | ICD-10-CM | POA: Insufficient documentation

## 2019-11-26 DIAGNOSIS — O10019 Pre-existing essential hypertension complicating pregnancy, unspecified trimester: Secondary | ICD-10-CM

## 2019-11-26 DIAGNOSIS — Z3A33 33 weeks gestation of pregnancy: Secondary | ICD-10-CM

## 2019-11-26 DIAGNOSIS — Z362 Encounter for other antenatal screening follow-up: Secondary | ICD-10-CM | POA: Diagnosis not present

## 2019-11-26 DIAGNOSIS — E669 Obesity, unspecified: Secondary | ICD-10-CM

## 2019-11-26 DIAGNOSIS — O10919 Unspecified pre-existing hypertension complicating pregnancy, unspecified trimester: Secondary | ICD-10-CM

## 2019-11-26 NOTE — Procedures (Signed)
[facility] 10/12/1985 [redacted]w[redacted]d  Fetus A Non-Stress Test Interpretation for 11/26/19  Indication: Unsatisfactory BPP  Fetal Heart Rate A Mode: External Baseline Rate (A): 135 bpm Variability: Moderate Accelerations: 15 x 15 Decelerations: None Multiple birth?: No  Uterine Activity Mode: Toco Contraction Frequency (min): none noted Resting Tone Palpated: Relaxed Resting Time: Adequate  Interpretation (Fetal Testing) Nonstress Test Interpretation: Reactive Comments: FHR tracing rev'd by Dr. Annamaria Boots

## 2019-11-30 ENCOUNTER — Encounter: Payer: Self-pay | Admitting: General Practice

## 2019-12-03 ENCOUNTER — Encounter: Payer: Self-pay | Admitting: *Deleted

## 2019-12-03 ENCOUNTER — Ambulatory Visit: Payer: Medicaid Other | Admitting: *Deleted

## 2019-12-03 ENCOUNTER — Other Ambulatory Visit: Payer: Self-pay

## 2019-12-03 ENCOUNTER — Other Ambulatory Visit: Payer: Self-pay | Admitting: Obstetrics and Gynecology

## 2019-12-03 ENCOUNTER — Ambulatory Visit: Payer: Medicaid Other | Attending: Obstetrics and Gynecology

## 2019-12-03 DIAGNOSIS — O10919 Unspecified pre-existing hypertension complicating pregnancy, unspecified trimester: Secondary | ICD-10-CM

## 2019-12-03 DIAGNOSIS — Z3A34 34 weeks gestation of pregnancy: Secondary | ICD-10-CM

## 2019-12-03 DIAGNOSIS — O169 Unspecified maternal hypertension, unspecified trimester: Secondary | ICD-10-CM | POA: Diagnosis present

## 2019-12-03 DIAGNOSIS — Z362 Encounter for other antenatal screening follow-up: Secondary | ICD-10-CM

## 2019-12-03 DIAGNOSIS — O09523 Supervision of elderly multigravida, third trimester: Secondary | ICD-10-CM | POA: Diagnosis not present

## 2019-12-03 DIAGNOSIS — O2690 Pregnancy related conditions, unspecified, unspecified trimester: Secondary | ICD-10-CM

## 2019-12-03 DIAGNOSIS — E669 Obesity, unspecified: Secondary | ICD-10-CM

## 2019-12-03 DIAGNOSIS — O0990 Supervision of high risk pregnancy, unspecified, unspecified trimester: Secondary | ICD-10-CM

## 2019-12-03 DIAGNOSIS — O10013 Pre-existing essential hypertension complicating pregnancy, third trimester: Secondary | ICD-10-CM | POA: Diagnosis not present

## 2019-12-03 DIAGNOSIS — O99213 Obesity complicating pregnancy, third trimester: Secondary | ICD-10-CM

## 2019-12-03 NOTE — Procedures (Signed)
Ann Bruce [birth year] [redacted]w[redacted]d  Fetus A Non-Stress Test Interpretation for 12/03/19  Indication: Unsatisfactory BPP  Fetal Heart Rate A Mode: External Baseline Rate (A): 125 bpm Variability: Moderate Accelerations: 15 x 15 Decelerations: None Multiple birth?: No  Uterine Activity Mode: Palpation, Toco Contraction Frequency (min): none noted Resting Tone Palpated: Relaxed Resting Time: Adequate  Interpretation (Fetal Testing) Nonstress Test Interpretation: Reactive Comments: Reviewed FHR tracing with Dr. Annamaria Boots

## 2019-12-03 NOTE — Progress Notes (Signed)
Came by office and signed BTS consent Katey Barrie,RN

## 2019-12-06 ENCOUNTER — Ambulatory Visit (INDEPENDENT_AMBULATORY_CARE_PROVIDER_SITE_OTHER): Payer: Medicaid Other | Admitting: Family Medicine

## 2019-12-06 ENCOUNTER — Other Ambulatory Visit: Payer: Self-pay

## 2019-12-06 VITALS — BP 114/73 | HR 97 | Wt 196.5 lb

## 2019-12-06 DIAGNOSIS — D649 Anemia, unspecified: Secondary | ICD-10-CM

## 2019-12-06 DIAGNOSIS — O163 Unspecified maternal hypertension, third trimester: Secondary | ICD-10-CM | POA: Diagnosis not present

## 2019-12-06 DIAGNOSIS — O169 Unspecified maternal hypertension, unspecified trimester: Secondary | ICD-10-CM

## 2019-12-06 DIAGNOSIS — O0993 Supervision of high risk pregnancy, unspecified, third trimester: Secondary | ICD-10-CM

## 2019-12-06 DIAGNOSIS — O0990 Supervision of high risk pregnancy, unspecified, unspecified trimester: Secondary | ICD-10-CM

## 2019-12-06 DIAGNOSIS — Z3A35 35 weeks gestation of pregnancy: Secondary | ICD-10-CM

## 2019-12-06 DIAGNOSIS — O99013 Anemia complicating pregnancy, third trimester: Secondary | ICD-10-CM

## 2019-12-06 MED ORDER — FERROUS GLUCONATE 324 (38 FE) MG PO TABS: 324.0000 mg | ORAL_TABLET | Freq: Every day | ORAL | 3 refills | Status: DC

## 2019-12-06 MED ORDER — FAMOTIDINE 20 MG PO TABS: 20.0000 mg | ORAL_TABLET | Freq: Two times a day (BID) | ORAL | 3 refills | Status: DC

## 2019-12-06 NOTE — Progress Notes (Signed)
   PRENATAL VISIT NOTE  Subjective:  Ann Bruce is a 34 y.o. G3P1102 at [redacted]w[redacted]d being seen today for ongoing prenatal care.  She is currently monitored for the following issues for this high-risk pregnancy and has Supervision of high risk pregnancy, antepartum; Hypertension affecting pregnancy, antepartum; and Hemorrhoids during pregnancy in second trimester on their problem list.  Patient reports heartburn and fatigue.  Contractions: Irritability. Vag. Bleeding: None.  Movement: Present. Denies leaking of fluid.   The following portions of the patient's history were reviewed and updated as appropriate: allergies, current medications, past family history, past medical history, past social history, past surgical history and problem list.   Objective:   Vitals:   12/06/19 1452  BP: 114/73  Pulse: 97  Weight: 196 lb 8 oz (89.1 kg)    Fetal Status: Fetal Heart Rate (bpm): 143   Movement: Present     General:  Alert, oriented and cooperative. Patient is in no acute distress.  Skin: Skin is warm and dry. No rash noted.   Cardiovascular: Normal heart rate noted  Respiratory: Normal respiratory effort, no problems with respiration noted  Abdomen: Soft, gravid, appropriate for gestational age.  Pain/Pressure: Present     Pelvic: Cervical exam deferred        Extremities: Normal range of motion.  Edema: Trace  Mental Status: Normal mood and affect. Normal behavior. Normal judgment and thought content.   Assessment and Plan:  Pregnancy: G3P1102 at [redacted]w[redacted]d 1. Supervision of high risk pregnancy, antepartum FHT and FH Normal  2. Hypertension affecting pregnancy, antepartum BP low normal. As patient still feeling fatigued, will stop labetalol (only taking 200mg  once a day) Continue BPP weekly [place] at 39 weeks.  3. Anemia Start iron supplementation  Preterm labor symptoms and general obstetric precautions including but not limited to vaginal bleeding, contractions, leaking of fluid and  fetal movement were reviewed in detail with the patient. Please refer to After Visit Summary for other counseling recommendations.   No follow-ups on file.  Future Appointments  Date Time Provider [place]  12/10/2019 10:45 AM [facility] NURSE [facility]  12/10/2019 10:45 AM [facility] US4 [facility] [place], DO

## 2019-12-07 LAB — POCT URINALYSIS DIP (DEVICE)
Bilirubin Urine: NEGATIVE
Glucose, UA: NEGATIVE mg/dL
Hgb urine dipstick: NEGATIVE
Ketones, ur: 15 mg/dL — AB
Nitrite: NEGATIVE
Protein, ur: 30 mg/dL — AB
Specific Gravity, Urine: >1.03 — ABNORMAL HIGH (ref 1.005–1.030)
Urobilinogen, UA: 1 mg/dL (ref 0.0–1.0)
pH: 6 (ref 5.0–8.0)

## 2019-12-10 ENCOUNTER — Ambulatory Visit: Payer: Medicaid Other

## 2019-12-10 ENCOUNTER — Ambulatory Visit: Payer: Medicaid Other | Admitting: *Deleted

## 2019-12-10 ENCOUNTER — Other Ambulatory Visit: Payer: Self-pay

## 2019-12-10 ENCOUNTER — Ambulatory Visit: Payer: Medicaid Other | Attending: Obstetrics and Gynecology

## 2019-12-10 ENCOUNTER — Other Ambulatory Visit: Payer: Self-pay | Admitting: Obstetrics and Gynecology

## 2019-12-10 DIAGNOSIS — O10013 Pre-existing essential hypertension complicating pregnancy, third trimester: Secondary | ICD-10-CM | POA: Diagnosis not present

## 2019-12-10 DIAGNOSIS — O10919 Unspecified pre-existing hypertension complicating pregnancy, unspecified trimester: Secondary | ICD-10-CM

## 2019-12-10 DIAGNOSIS — O99213 Obesity complicating pregnancy, third trimester: Secondary | ICD-10-CM | POA: Diagnosis not present

## 2019-12-10 DIAGNOSIS — O0990 Supervision of high risk pregnancy, unspecified, unspecified trimester: Secondary | ICD-10-CM

## 2019-12-10 DIAGNOSIS — O169 Unspecified maternal hypertension, unspecified trimester: Secondary | ICD-10-CM

## 2019-12-10 DIAGNOSIS — O2693 Pregnancy related conditions, unspecified, third trimester: Secondary | ICD-10-CM | POA: Diagnosis not present

## 2019-12-10 DIAGNOSIS — Z3A35 35 weeks gestation of pregnancy: Secondary | ICD-10-CM

## 2019-12-10 DIAGNOSIS — E669 Obesity, unspecified: Secondary | ICD-10-CM

## 2019-12-10 DIAGNOSIS — O09523 Supervision of elderly multigravida, third trimester: Secondary | ICD-10-CM

## 2019-12-10 NOTE — Procedures (Signed)
Ann Bruce February 10, 1986 [redacted]w[redacted]d  Fetus A Non-Stress Test Interpretation for 12/10/19  Indication: Unsatisfactory BPP  Fetal Heart Rate A Mode: External Baseline Rate (A): 130 bpm Variability: Moderate Accelerations: 15 x 15 Decelerations: None Multiple birth?: No  Uterine Activity Mode: Palpation, Toco Contraction Frequency (min): none noted Resting Tone Palpated: Relaxed Resting Time: Adequate  Interpretation (Fetal Testing) Nonstress Test Interpretation: Reactive Comments: Reviewed tracing with Dr. Donalee Citrin

## 2019-12-16 ENCOUNTER — Other Ambulatory Visit: Payer: Self-pay

## 2019-12-16 ENCOUNTER — Ambulatory Visit (INDEPENDENT_AMBULATORY_CARE_PROVIDER_SITE_OTHER): Payer: Medicaid Other | Admitting: Family Medicine

## 2019-12-16 ENCOUNTER — Other Ambulatory Visit (HOSPITAL_COMMUNITY)
Admission: RE | Admit: 2019-12-16 | Discharge: 2019-12-16 | Disposition: A | Discharge status: Home / Self Care | Payer: Medicaid Other | Source: Ambulatory Visit | Attending: Family Medicine | Admitting: Family Medicine

## 2019-12-16 ENCOUNTER — Encounter: Payer: Self-pay | Admitting: Family Medicine

## 2019-12-16 VITALS — BP 124/76 | HR 95 | Wt 199.5 lb

## 2019-12-16 DIAGNOSIS — O0990 Supervision of high risk pregnancy, unspecified, unspecified trimester: Secondary | ICD-10-CM | POA: Insufficient documentation

## 2019-12-16 DIAGNOSIS — O2243 Hemorrhoids in pregnancy, third trimester: Secondary | ICD-10-CM

## 2019-12-16 DIAGNOSIS — O0993 Supervision of high risk pregnancy, unspecified, third trimester: Secondary | ICD-10-CM

## 2019-12-16 DIAGNOSIS — Z3A36 36 weeks gestation of pregnancy: Secondary | ICD-10-CM | POA: Diagnosis not present

## 2019-12-16 DIAGNOSIS — R7401 Elevation of levels of liver transaminase levels: Secondary | ICD-10-CM | POA: Insufficient documentation

## 2019-12-16 DIAGNOSIS — O169 Unspecified maternal hypertension, unspecified trimester: Secondary | ICD-10-CM

## 2019-12-16 DIAGNOSIS — O2242 Hemorrhoids in pregnancy, second trimester: Secondary | ICD-10-CM

## 2019-12-16 DIAGNOSIS — O163 Unspecified maternal hypertension, third trimester: Secondary | ICD-10-CM

## 2019-12-16 LAB — POCT URINALYSIS DIP (DEVICE)
Bilirubin Urine: NEGATIVE
Glucose, UA: NEGATIVE mg/dL
Ketones, ur: NEGATIVE mg/dL
Leukocytes,Ua: NEGATIVE
Nitrite: NEGATIVE
Protein, ur: NEGATIVE mg/dL
Specific Gravity, Urine: >=1.03 (ref 1.005–1.030)
Urobilinogen, UA: 1 mg/dL (ref 0.0–1.0)
pH: 6 (ref 5.0–8.0)

## 2019-12-16 NOTE — Addendum Note (Signed)
Addended by: Clayton Lefort on: 12/16/2019 05:09 PM   Modules accepted: Orders

## 2019-12-16 NOTE — Patient Instructions (Signed)
Contraception Choices Contraception, also called birth control, refers to methods or devices that prevent pregnancy. Hormonal methods Contraceptive implant  A contraceptive implant is a thin, plastic tube that contains a hormone. It is inserted into the upper part of the arm. It can remain in place for up to 3 years. Progestin-only injections Progestin-only injections are injections of progestin, a synthetic form of the hormone progesterone. They are given every 3 months by a health care provider. Birth control pills  Birth control pills are pills that contain hormones that prevent pregnancy. They must be taken once a day, preferably at the same time each day. Birth control patch  The birth control patch contains hormones that prevent pregnancy. It is placed on the skin and must be changed once a week for three weeks and removed on the fourth week. A prescription is needed to use this method of contraception. Vaginal ring  A vaginal ring contains hormones that prevent pregnancy. It is placed in the vagina for three weeks and removed on the fourth week. After that, the process is repeated with a new ring. A prescription is needed to use this method of contraception. Emergency contraceptive Emergency contraceptives prevent pregnancy after unprotected sex. They come in pill form and can be taken up to 5 days after sex. They work best the sooner they are taken after having sex. Most emergency contraceptives are available without a prescription. This method should not be used as your only form of birth control. Barrier methods Female condom  A female condom is a thin sheath that is worn over the penis during sex. Condoms keep sperm from going inside a woman's body. They can be used with a spermicide to increase their effectiveness. They should be disposed after a single use. Female condom  A female condom is a soft, loose-fitting sheath that is put into the vagina before sex. The condom keeps  sperm from going inside a woman's body. They should be disposed after a single use. Diaphragm  A diaphragm is a soft, dome-shaped barrier. It is inserted into the vagina before sex, along with a spermicide. The diaphragm blocks sperm from entering the uterus, and the spermicide kills sperm. A diaphragm should be left in the vagina for 6-8 hours after sex and removed within 24 hours. A diaphragm is prescribed and fitted by a health care provider. A diaphragm should be replaced every 1-2 years, after giving birth, after gaining more than 15 lb (6.8 kg), and after pelvic surgery. Cervical cap  A cervical cap is a round, soft latex or plastic cup that fits over the cervix. It is inserted into the vagina before sex, along with spermicide. It blocks sperm from entering the uterus. The cap should be left in place for 6-8 hours after sex and removed within 48 hours. A cervical cap must be prescribed and fitted by a health care provider. It should be replaced every 2 years. Sponge  A sponge is a soft, circular piece of polyurethane foam with spermicide on it. The sponge helps block sperm from entering the uterus, and the spermicide kills sperm. To use it, you make it wet and then insert it into the vagina. It should be inserted before sex, left in for at least 6 hours after sex, and removed and thrown away within 30 hours. Spermicides Spermicides are chemicals that kill or block sperm from entering the cervix and uterus. They can come as a cream, jelly, suppository, foam, or tablet. A spermicide should be inserted into  the vagina with an applicator at least 10-15 minutes before sex to allow time for it to work. The process must be repeated every time you have sex. Spermicides do not require a prescription. Intrauterine contraception Intrauterine device (IUD) An IUD is a T-shaped device that is put in a woman's uterus. There are two types:  Hormone IUD.This type contains progestin, a synthetic form of the  hormone progesterone. This type can stay in place for 3-5 years.  Copper IUD.This type is wrapped in copper wire. It can stay in place for 10 years.  Permanent methods of contraception Female tubal ligation In this method, a woman's fallopian tubes are sealed, tied, or blocked during surgery to prevent eggs from traveling to the uterus. Hysteroscopic sterilization In this method, a small, flexible insert is placed into each fallopian tube. The inserts cause scar tissue to form in the fallopian tubes and block them, so sperm cannot reach an egg. The procedure takes about 3 months to be effective. Another form of birth control must be used during those 3 months. Female sterilization This is a procedure to tie off the tubes that carry sperm (vasectomy). After the procedure, the man can still ejaculate fluid (semen). Natural planning methods Natural family planning In this method, a couple does not have sex on days when the woman could become pregnant. Calendar method This means keeping track of the length of each menstrual cycle, identifying the days when pregnancy can happen, and not having sex on those days. Ovulation method In this method, a couple avoids sex during ovulation. Symptothermal method This method involves not having sex during ovulation. The woman typically checks for ovulation by watching changes in her temperature and in the consistency of cervical mucus. Post-ovulation method In this method, a couple waits to have sex until after ovulation. Summary  Contraception, also called birth control, means methods or devices that prevent pregnancy.  Hormonal methods of contraception include implants, injections, pills, patches, vaginal rings, and emergency contraceptives.  Barrier methods of contraception can include female condoms, female condoms, diaphragms, cervical caps, sponges, and spermicides.  There are two types of IUDs (intrauterine devices). An IUD can be put in a woman's  uterus to prevent pregnancy for 3-5 years.  Permanent sterilization can be done through a procedure for males, females, or both.  Natural family planning methods involve not having sex on days when the woman could become pregnant. This information is not intended to replace advice given to you by your health care provider. Make sure you discuss any questions you have with your health care provider. Document Revised: 05/15/2017 Document Reviewed: 06/15/2016 Elsevier Patient Education  2020 [employer]   Breastfeeding  Choosing to breastfeed is one of the best decisions you can make for yourself and your baby. A change in hormones during pregnancy causes your breasts to make breast milk in your milk-producing glands. Hormones prevent breast milk from being released before your baby is born. They also prompt milk flow after birth. Once breastfeeding has begun, thoughts of your baby, as well as his or her sucking or crying, can stimulate the release of milk from your milk-producing glands. Benefits of breastfeeding Research shows that breastfeeding offers many health benefits for infants and mothers. It also offers a cost-free and convenient way to feed your baby. For your baby  Your first milk (colostrum) helps your baby's digestive system to function better.  Special cells in your milk (antibodies) help your baby to fight off infections.  Breastfed babies are   less likely to develop asthma, allergies, obesity, or type 2 diabetes. They are also at lower risk for sudden infant death syndrome (SIDS).  Nutrients in breast milk are better able to meet your baby's needs compared to infant formula.  Breast milk improves your baby's brain development. For you  Breastfeeding helps to create a very special bond between you and your baby.  Breastfeeding is convenient. Breast milk costs nothing and is always available at the correct temperature.  Breastfeeding helps to burn calories. It helps you  to lose the weight that you gained during pregnancy.  Breastfeeding makes your uterus return faster to its size before pregnancy. It also slows bleeding (lochia) after you give birth.  Breastfeeding helps to lower your risk of developing type 2 diabetes, osteoporosis, rheumatoid arthritis, cardiovascular disease, and breast, ovarian, uterine, and endometrial cancer later in life. Breastfeeding basics Starting breastfeeding  Find a comfortable place to sit or lie down, with your neck and back well-supported.  Place a pillow or a rolled-up blanket under your baby to bring him or her to the level of your breast (if you are seated). Nursing pillows are specially designed to help support your arms and your baby while you breastfeed.  Make sure that your baby's tummy (abdomen) is facing your abdomen.  Gently massage your breast. With your fingertips, massage from the outer edges of your breast inward toward the nipple. This encourages milk flow. If your milk flows slowly, you may need to continue this action during the feeding.  Support your breast with 4 fingers underneath and your thumb above your nipple (make the letter "C" with your hand). Make sure your fingers are well away from your nipple and your baby's mouth.  Stroke your baby's lips gently with your finger or nipple.  When your baby's mouth is open wide enough, quickly bring your baby to your breast, placing your entire nipple and as much of the areola as possible into your baby's mouth. The areola is the colored area around your nipple. ? More areola should be visible above your baby's upper lip than below the lower lip. ? Your baby's lips should be opened and extended outward (flanged) to ensure an adequate, comfortable latch. ? Your baby's tongue should be between his or her lower gum and your breast.  Make sure that your baby's mouth is correctly positioned around your nipple (latched). Your baby's lips should create a seal on your  breast and be turned out (everted).  It is common for your baby to suck about 2-3 minutes in order to start the flow of breast milk. Latching Teaching your baby how to latch onto your breast properly is very important. An improper latch can cause nipple pain, decreased milk supply, and poor weight gain in your baby. Also, if your baby is not latched onto your nipple properly, he or she may swallow some air during feeding. This can make your baby fussy. Burping your baby when you switch breasts during the feeding can help to get rid of the air. However, teaching your baby to latch on properly is still the best way to prevent fussiness from swallowing air while breastfeeding. Signs that your baby has successfully latched onto your nipple  Silent tugging or silent sucking, without causing you pain. Infant's lips should be extended outward (flanged).  Swallowing heard between every 3-4 sucks once your milk has started to flow (after your let-down milk reflex occurs).  Muscle movement above and in front of his or her   ears while sucking. Signs that your baby has not successfully latched onto your nipple  Sucking sounds or smacking sounds from your baby while breastfeeding.  Nipple pain. If you think your baby has not latched on correctly, slip your finger into the corner of your baby's mouth to break the suction and place it between your baby's gums. Attempt to start breastfeeding again. Signs of successful breastfeeding Signs from your baby  Your baby will gradually decrease the number of sucks or will completely stop sucking.  Your baby will fall asleep.  Your baby's body will relax.  Your baby will retain a small amount of milk in his or her mouth.  Your baby will let go of your breast by himself or herself. Signs from you  Breasts that have increased in firmness, weight, and size 1-3 hours after feeding.  Breasts that are softer immediately after breastfeeding.  Increased milk  volume, as well as a change in milk consistency and color by the fifth day of breastfeeding.  Nipples that are not sore, cracked, or bleeding. Signs that your baby is getting enough milk  Wetting at least 1-2 diapers during the first 24 hours after birth.  Wetting at least 5-6 diapers every 24 hours for the first week after birth. The urine should be clear or pale yellow by the age of 5 days.  Wetting 6-8 diapers every 24 hours as your baby continues to grow and develop.  At least 3 stools in a 24-hour period by the age of 5 days. The stool should be soft and yellow.  At least 3 stools in a 24-hour period by the age of 7 days. The stool should be seedy and yellow.  No loss of weight greater than 10% of birth weight during the first 3 days of life.  Average weight gain of 4-7 oz (113-198 g) per week after the age of 4 days.  Consistent daily weight gain by the age of 5 days, without weight loss after the age of 2 weeks. After a feeding, your baby may spit up a small amount of milk. This is normal. Breastfeeding frequency and duration Frequent feeding will help you make more milk and can prevent sore nipples and extremely full breasts (breast engorgement). Breastfeed when you feel the need to reduce the fullness of your breasts or when your baby shows signs of hunger. This is called "breastfeeding on demand." Signs that your baby is hungry include:  Increased alertness, activity, or restlessness.  Movement of the head from side to side.  Opening of the mouth when the corner of the mouth or cheek is stroked (rooting).  Increased sucking sounds, smacking lips, cooing, sighing, or squeaking.  Hand-to-mouth movements and sucking on fingers or hands.  Fussing or crying. Avoid introducing a pacifier to your baby in the first 4-6 weeks after your baby is born. After this time, you may choose to use a pacifier. Research has shown that pacifier use during the first year of a baby's life  decreases the risk of sudden infant death syndrome (SIDS). Allow your baby to feed on each breast as long as he or she wants. When your baby unlatches or falls asleep while feeding from the first breast, offer the second breast. Because newborns are often sleepy in the first few weeks of life, you may need to awaken your baby to get him or her to feed. Breastfeeding times will vary from baby to baby. However, the following rules can serve as a guide to   help you make sure that your baby is properly fed:  Newborns (babies 4 weeks of age or younger) may breastfeed every 1-3 hours.  Newborns should not go without breastfeeding for longer than 3 hours during the day or 5 hours during the night.  You should breastfeed your baby a minimum of 8 times in a 24-hour period. Breast milk pumping     Pumping and storing breast milk allows you to make sure that your baby is exclusively fed your breast milk, even at times when you are unable to breastfeed. This is especially important if you go back to work while you are still breastfeeding, or if you are not able to be present during feedings. Your lactation consultant can help you find a method of pumping that works best for you and give you guidelines about how long it is safe to store breast milk. Caring for your breasts while you breastfeed Nipples can become dry, cracked, and sore while breastfeeding. The following recommendations can help keep your breasts moisturized and healthy:  Avoid using soap on your nipples.  Wear a supportive bra designed especially for nursing. Avoid wearing underwire-style bras or extremely tight bras (sports bras).  Air-dry your nipples for 3-4 minutes after each feeding.  Use only cotton bra pads to absorb leaked breast milk. Leaking of breast milk between feedings is normal.  Use lanolin on your nipples after breastfeeding. Lanolin helps to maintain your skin's normal moisture barrier. Pure lanolin is not harmful (not  toxic) to your baby. You may also hand express a few drops of breast milk and gently massage that milk into your nipples and allow the milk to air-dry. In the first few weeks after giving birth, some women experience breast engorgement. Engorgement can make your breasts feel heavy, warm, and tender to the touch. Engorgement peaks within 3-5 days after you give birth. The following recommendations can help to ease engorgement:  Completely empty your breasts while breastfeeding or pumping. You may want to start by applying warm, moist heat (in the shower or with warm, water-soaked hand towels) just before feeding or pumping. This increases circulation and helps the milk flow. If your baby does not completely empty your breasts while breastfeeding, pump any extra milk after he or she is finished.  Apply ice packs to your breasts immediately after breastfeeding or pumping, unless this is too uncomfortable for you. To do this: ? Put ice in a plastic bag. ? Place a towel between your skin and the bag. ? Leave the ice on for 20 minutes, 2-3 times a day.  Make sure that your baby is latched on and positioned properly while breastfeeding. If engorgement persists after 48 hours of following these recommendations, contact your health care provider or a lactation consultant. Overall health care recommendations while breastfeeding  Eat 3 healthy meals and 3 snacks every day. Well-nourished mothers who are breastfeeding need an additional 450-500 calories a day. You can meet this requirement by increasing the amount of a balanced diet that you eat.  Drink enough water to keep your urine pale yellow or clear.  Rest often, relax, and continue to take your prenatal vitamins to prevent fatigue, stress, and low vitamin and mineral levels in your body (nutrient deficiencies).  Do not use any products that contain nicotine or tobacco, such as cigarettes and e-cigarettes. Your baby may be harmed by chemicals from  cigarettes that pass into breast milk and exposure to secondhand smoke. If you need help quitting, ask your   health care provider.  Avoid alcohol.  Do not use illegal drugs or marijuana.  Talk with your health care provider before taking any medicines. These include over-the-counter and prescription medicines as well as vitamins and herbal supplements. Some medicines that may be harmful to your baby can pass through breast milk.  It is possible to become pregnant while breastfeeding. If birth control is desired, ask your health care provider about options that will be safe while breastfeeding your baby. Where to find more information: [employer]: www.llli.org Contact a health care provider if:  You feel like you want to stop breastfeeding or have become frustrated with breastfeeding.  Your nipples are cracked or bleeding.  Your breasts are red, tender, or warm.  You have: ? Painful breasts or nipples. ? A swollen area on either breast. ? A fever or chills. ? Nausea or vomiting. ? Drainage other than breast milk from your nipples.  Your breasts do not become full before feedings by the fifth day after you give birth.  You feel sad and depressed.  Your baby is: ? Too sleepy to eat well. ? Having trouble sleeping. ? More than 1 week old and wetting fewer than 6 diapers in a 24-hour period. ? Not gaining weight by 5 days of age.  Your baby has fewer than 3 stools in a 24-hour period.  Your baby's skin or the white parts of his or her eyes become yellow. Get help right away if:  Your baby is overly tired (lethargic) and does not want to wake up and feed.  Your baby develops an unexplained fever. Summary  Breastfeeding offers many health benefits for infant and mothers.  Try to breastfeed your infant when he or she shows early signs of hunger.  Gently tickle or stroke your baby's lips with your finger or nipple to allow the baby to open his or her mouth.  Bring the baby to your breast. Make sure that much of the areola is in your baby's mouth. Offer one side and burp the baby before you offer the other side.  Talk with your health care provider or lactation consultant if you have questions or you face problems as you breastfeed. This information is not intended to replace advice given to you by your health care provider. Make sure you discuss any questions you have with your health care provider. Document Revised: 08/07/2017 Document Reviewed: 06/14/2016 Elsevier Patient Education  2020 [employer] Inc.  

## 2019-12-16 NOTE — Progress Notes (Signed)
   Subjective:  Ann Bruce is a 34 y.o. G3P1102 at [redacted]w[redacted]d being seen today for ongoing prenatal care.  She is currently monitored for the following issues for this high-risk pregnancy and has Supervision of high risk pregnancy, antepartum; Hypertension affecting pregnancy, antepartum; Hemorrhoids during pregnancy in second trimester; and Elevated AST (SGOT) on their problem list.  Patient reports no complaints.  Contractions: Not present. Vag. Bleeding: None.  Movement: Present. Denies leaking of fluid.   The following portions of the patient's history were reviewed and updated as appropriate: allergies, current medications, past family history, past medical history, past social history, past surgical history and problem list. Problem list updated.  Objective:   Vitals:   12/16/19 1344  BP: 124/76  Pulse: 95  Weight: 199 lb 8 oz (90.5 kg)    Fetal Status: Fetal Heart Rate (bpm): 130   Movement: Present     General:  Alert, oriented and cooperative. Patient is in no acute distress.  Skin: Skin is warm and dry. No rash noted.   Cardiovascular: Normal heart rate noted  Respiratory: Normal respiratory effort, no problems with respiration noted  Abdomen: Soft, gravid, appropriate for gestational age. Pain/Pressure: Absent     Pelvic: Vag. Bleeding: None     Cervical exam performed        Extremities: Normal range of motion.  Edema: Trace  Mental Status: Normal mood and affect. Normal behavior. Normal judgment and thought content.   Urinalysis:      Assessment and Plan:  Pregnancy: G3P1102 at [redacted]w[redacted]d  1. Supervision of high risk pregnancy, antepartum Stable BP and FHR normal Swabs collected today  2. Hypertension affecting pregnancy, antepartum Well controlled off meds Last growth 33% on 11/26/2019 BPP 8/10 on 12/10/2019 Schedule IOL at 39wks next visit  3. Hemorrhoids during pregnancy in second trimester   4. Elevated AST Noted on review of labs, AST 101 on MAU visit on  11/11/2019, had previously been normal three months prior Other PIH labs normal Will recheck CBC/CMP today given 1 month out from last check  Preterm labor symptoms and general obstetric precautions including but not limited to vaginal bleeding, contractions, leaking of fluid and fetal movement were reviewed in detail with the patient. Please refer to After Visit Summary for other counseling recommendations.  Return in 1 week (on 12/23/2019) for [facility], ob visit, in person.   Clarnce Flock, MD

## 2019-12-17 ENCOUNTER — Telehealth: Payer: Self-pay | Admitting: Obstetrics and Gynecology

## 2019-12-17 ENCOUNTER — Ambulatory Visit: Payer: Medicaid Other | Attending: Obstetrics and Gynecology

## 2019-12-17 ENCOUNTER — Other Ambulatory Visit: Payer: Self-pay | Admitting: *Deleted

## 2019-12-17 ENCOUNTER — Ambulatory Visit: Payer: Medicaid Other | Admitting: *Deleted

## 2019-12-17 DIAGNOSIS — O10919 Unspecified pre-existing hypertension complicating pregnancy, unspecified trimester: Secondary | ICD-10-CM

## 2019-12-17 DIAGNOSIS — O10013 Pre-existing essential hypertension complicating pregnancy, third trimester: Secondary | ICD-10-CM

## 2019-12-17 DIAGNOSIS — E669 Obesity, unspecified: Secondary | ICD-10-CM

## 2019-12-17 DIAGNOSIS — O0990 Supervision of high risk pregnancy, unspecified, unspecified trimester: Secondary | ICD-10-CM | POA: Insufficient documentation

## 2019-12-17 DIAGNOSIS — O2693 Pregnancy related conditions, unspecified, third trimester: Secondary | ICD-10-CM | POA: Diagnosis not present

## 2019-12-17 DIAGNOSIS — O09523 Supervision of elderly multigravida, third trimester: Secondary | ICD-10-CM

## 2019-12-17 DIAGNOSIS — O99213 Obesity complicating pregnancy, third trimester: Secondary | ICD-10-CM

## 2019-12-17 DIAGNOSIS — O169 Unspecified maternal hypertension, unspecified trimester: Secondary | ICD-10-CM | POA: Diagnosis present

## 2019-12-17 DIAGNOSIS — Z3A36 36 weeks gestation of pregnancy: Secondary | ICD-10-CM

## 2019-12-17 LAB — COMPREHENSIVE METABOLIC PANEL
ALT: 9 IU/L (ref 0–32)
AST: 12 IU/L (ref 0–40)
Albumin/Globulin Ratio: 1.4 (ref 1.2–2.2)
Albumin: 3.5 g/dL — ABNORMAL LOW (ref 3.8–4.8)
Alkaline Phosphatase: 87 IU/L (ref 48–121)
BUN/Creatinine Ratio: 10 (ref 9–23)
BUN: 5 mg/dL — ABNORMAL LOW (ref 6–20)
Bilirubin Total: 0.3 mg/dL (ref 0.0–1.2)
CO2: 21 mmol/L (ref 20–29)
Calcium: 9 mg/dL (ref 8.7–10.2)
Chloride: 105 mmol/L (ref 96–106)
Creatinine, Ser: 0.5 mg/dL — ABNORMAL LOW (ref 0.57–1.00)
GFR calc Af Amer: 146 mL/min/{1.73_m2} (ref >59)
GFR calc non Af Amer: 127 mL/min/{1.73_m2} (ref >59)
Globulin, Total: 2.5 g/dL (ref 1.5–4.5)
Glucose: 88 mg/dL (ref 65–99)
Potassium: 3.7 mmol/L (ref 3.5–5.2)
Sodium: 138 mmol/L (ref 134–144)
Total Protein: 6 g/dL (ref 6.0–8.5)

## 2019-12-17 LAB — CBC
Hematocrit: 34.1 % (ref 34.0–46.6)
Hemoglobin: 11.7 g/dL (ref 11.1–15.9)
MCH: 31.3 pg (ref 26.6–33.0)
MCHC: 34.3 g/dL (ref 31.5–35.7)
MCV: 91 fL (ref 79–97)
Platelets: 228 10*3/uL (ref 150–450)
RBC: 3.74 x10E6/uL — ABNORMAL LOW (ref 3.77–5.28)
RDW: 12.6 % (ref 11.7–15.4)
WBC: 5 10*3/uL (ref 3.4–10.8)

## 2019-12-17 NOTE — Telephone Encounter (Signed)
Was able to reach patient and she is able to come in this morning. Message to Ann Bruce that patient is coming in now.

## 2019-12-17 NOTE — Telephone Encounter (Signed)
Patient calling to get an order for an [place]

## 2019-12-17 NOTE — Telephone Encounter (Signed)
Returned patients call. Patient reports every Friday she needs a BPP and she did not have another one scheduled after coming last week. Per last BPP results, patient is to have weekly BPP.    Called MFM and they reports she did not get scheduled. MFM to work patient in to the schedule today.   Patient called back and asked to come to the office now and if not able to come now to come to the office around 12. LM message about appt on patients voicemail as she did not answer. Gave phone # to MFM for patient to call if any questions.   Will send My Chart message to patient also.

## 2019-12-20 ENCOUNTER — Encounter: Payer: Self-pay | Admitting: Family Medicine

## 2019-12-20 DIAGNOSIS — O99820 Streptococcus B carrier state complicating pregnancy: Secondary | ICD-10-CM | POA: Insufficient documentation

## 2019-12-20 LAB — CERVICOVAGINAL ANCILLARY ONLY
Chlamydia: NEGATIVE
Comment: NEGATIVE
Comment: NORMAL
Neisseria Gonorrhea: NEGATIVE

## 2019-12-20 LAB — CULTURE, BETA STREP (GROUP B ONLY): Strep Gp B Culture: POSITIVE — AB

## 2019-12-23 ENCOUNTER — Other Ambulatory Visit: Payer: Self-pay

## 2019-12-23 ENCOUNTER — Ambulatory Visit (INDEPENDENT_AMBULATORY_CARE_PROVIDER_SITE_OTHER): Payer: Medicaid Other | Admitting: Obstetrics and Gynecology

## 2019-12-23 ENCOUNTER — Encounter: Payer: Self-pay | Admitting: Obstetrics and Gynecology

## 2019-12-23 VITALS — BP 124/74 | HR 88 | Wt 199.5 lb

## 2019-12-23 DIAGNOSIS — O10913 Unspecified pre-existing hypertension complicating pregnancy, third trimester: Secondary | ICD-10-CM

## 2019-12-23 DIAGNOSIS — O99820 Streptococcus B carrier state complicating pregnancy: Secondary | ICD-10-CM | POA: Diagnosis not present

## 2019-12-23 DIAGNOSIS — O0990 Supervision of high risk pregnancy, unspecified, unspecified trimester: Secondary | ICD-10-CM

## 2019-12-23 DIAGNOSIS — O2343 Unspecified infection of urinary tract in pregnancy, third trimester: Secondary | ICD-10-CM | POA: Diagnosis not present

## 2019-12-23 DIAGNOSIS — O10919 Unspecified pre-existing hypertension complicating pregnancy, unspecified trimester: Secondary | ICD-10-CM

## 2019-12-23 LAB — POCT URINALYSIS DIP (DEVICE)
Bilirubin Urine: NEGATIVE
Glucose, UA: NEGATIVE mg/dL
Hgb urine dipstick: NEGATIVE
Leukocytes,Ua: NEGATIVE
Nitrite: NEGATIVE
Protein, ur: NEGATIVE mg/dL
Specific Gravity, Urine: 1.025 (ref 1.005–1.030)
Urobilinogen, UA: 1 mg/dL (ref 0.0–1.0)
pH: 6 (ref 5.0–8.0)

## 2019-12-24 ENCOUNTER — Other Ambulatory Visit: Payer: Self-pay | Admitting: Obstetrics and Gynecology

## 2019-12-24 ENCOUNTER — Ambulatory Visit (HOSPITAL_BASED_OUTPATIENT_CLINIC_OR_DEPARTMENT_OTHER): Payer: Medicaid Other

## 2019-12-24 ENCOUNTER — Ambulatory Visit: Payer: Medicaid Other | Admitting: *Deleted

## 2019-12-24 ENCOUNTER — Encounter (HOSPITAL_COMMUNITY): Payer: Self-pay | Admitting: Obstetrics and Gynecology

## 2019-12-24 ENCOUNTER — Inpatient Hospital Stay (HOSPITAL_COMMUNITY)
Admission: AD | Admit: 2019-12-24 | Discharge: 2019-12-26 | DRG: 806 | Disposition: A | Discharge status: Home / Self Care | Payer: Medicaid Other | Attending: Obstetrics and Gynecology | Admitting: Obstetrics and Gynecology

## 2019-12-24 DIAGNOSIS — O4100X0 Oligohydramnios, unspecified trimester, not applicable or unspecified: Secondary | ICD-10-CM | POA: Diagnosis present

## 2019-12-24 DIAGNOSIS — O4103X0 Oligohydramnios, third trimester, not applicable or unspecified: Secondary | ICD-10-CM | POA: Diagnosis present

## 2019-12-24 DIAGNOSIS — O0990 Supervision of high risk pregnancy, unspecified, unspecified trimester: Secondary | ICD-10-CM

## 2019-12-24 DIAGNOSIS — Z3A37 37 weeks gestation of pregnancy: Secondary | ICD-10-CM | POA: Diagnosis not present

## 2019-12-24 DIAGNOSIS — O1002 Pre-existing essential hypertension complicating childbirth: Secondary | ICD-10-CM | POA: Diagnosis present

## 2019-12-24 DIAGNOSIS — O2243 Hemorrhoids in pregnancy, third trimester: Secondary | ICD-10-CM | POA: Diagnosis present

## 2019-12-24 DIAGNOSIS — O99214 Obesity complicating childbirth: Secondary | ICD-10-CM | POA: Diagnosis present

## 2019-12-24 DIAGNOSIS — Z87891 Personal history of nicotine dependence: Secondary | ICD-10-CM | POA: Diagnosis not present

## 2019-12-24 DIAGNOSIS — Z20822 Contact with and (suspected) exposure to covid-19: Secondary | ICD-10-CM | POA: Diagnosis present

## 2019-12-24 DIAGNOSIS — O09523 Supervision of elderly multigravida, third trimester: Secondary | ICD-10-CM

## 2019-12-24 DIAGNOSIS — O99824 Streptococcus B carrier state complicating childbirth: Secondary | ICD-10-CM | POA: Diagnosis present

## 2019-12-24 DIAGNOSIS — O10919 Unspecified pre-existing hypertension complicating pregnancy, unspecified trimester: Secondary | ICD-10-CM

## 2019-12-24 DIAGNOSIS — E669 Obesity, unspecified: Secondary | ICD-10-CM | POA: Diagnosis present

## 2019-12-24 DIAGNOSIS — O99820 Streptococcus B carrier state complicating pregnancy: Secondary | ICD-10-CM

## 2019-12-24 DIAGNOSIS — B951 Streptococcus, group B, as the cause of diseases classified elsewhere: Secondary | ICD-10-CM | POA: Diagnosis not present

## 2019-12-24 DIAGNOSIS — Z3A38 38 weeks gestation of pregnancy: Secondary | ICD-10-CM | POA: Diagnosis not present

## 2019-12-24 DIAGNOSIS — O2693 Pregnancy related conditions, unspecified, third trimester: Secondary | ICD-10-CM

## 2019-12-24 DIAGNOSIS — O10913 Unspecified pre-existing hypertension complicating pregnancy, third trimester: Secondary | ICD-10-CM | POA: Diagnosis not present

## 2019-12-24 DIAGNOSIS — O99213 Obesity complicating pregnancy, third trimester: Secondary | ICD-10-CM

## 2019-12-24 DIAGNOSIS — O2343 Unspecified infection of urinary tract in pregnancy, third trimester: Secondary | ICD-10-CM | POA: Diagnosis present

## 2019-12-24 LAB — COMPREHENSIVE METABOLIC PANEL
ALT: 13 U/L (ref 0–44)
AST: 18 U/L (ref 15–41)
Albumin: 3 g/dL — ABNORMAL LOW (ref 3.5–5.0)
Alkaline Phosphatase: 83 U/L (ref 38–126)
Anion gap: 11 (ref 5–15)
BUN: 6 mg/dL (ref 6–20)
CO2: 19 mmol/L — ABNORMAL LOW (ref 22–32)
Calcium: 8.6 mg/dL — ABNORMAL LOW (ref 8.9–10.3)
Chloride: 105 mmol/L (ref 98–111)
Creatinine, Ser: 0.57 mg/dL (ref 0.44–1.00)
GFR calc Af Amer: >60 mL/min (ref >60)
GFR calc non Af Amer: >60 mL/min (ref >60)
Glucose, Bld: 147 mg/dL — ABNORMAL HIGH (ref 70–99)
Potassium: 3.4 mmol/L — ABNORMAL LOW (ref 3.5–5.1)
Sodium: 135 mmol/L (ref 135–145)
Total Bilirubin: 0.2 mg/dL — ABNORMAL LOW (ref 0.3–1.2)
Total Protein: 5.9 g/dL — ABNORMAL LOW (ref 6.5–8.1)

## 2019-12-24 LAB — CBC
HCT: 37.3 % (ref 36.0–46.0)
Hemoglobin: 12.3 g/dL (ref 12.0–15.0)
MCH: 30.9 pg (ref 26.0–34.0)
MCHC: 33 g/dL (ref 30.0–36.0)
MCV: 93.7 fL (ref 80.0–100.0)
Platelets: 222 10*3/uL (ref 150–400)
RBC: 3.98 MIL/uL (ref 3.87–5.11)
RDW: 13.6 % (ref 11.5–15.5)
WBC: 4.8 10*3/uL (ref 4.0–10.5)
nRBC: 0 % (ref 0.0–0.2)

## 2019-12-24 LAB — TYPE AND SCREEN
ABO/RH(D): O POS
Antibody Screen: NEGATIVE

## 2019-12-24 LAB — PROTEIN / CREATININE RATIO, URINE
Creatinine, Urine: 38.98 mg/dL
Total Protein, Urine: <6 mg/dL

## 2019-12-24 LAB — SARS CORONAVIRUS 2 BY RT PCR (DIASORIN): SARS Coronavirus 2: NEGATIVE

## 2019-12-24 MED ORDER — ACETAMINOPHEN 325 MG PO TABS
650.0000 mg | ORAL_TABLET | Freq: Four times a day (QID) | ORAL | Status: DC | PRN
  Administered 2019-12-24: 650 mg via ORAL
  Filled 2019-12-24: qty 2

## 2019-12-24 MED ORDER — LIDOCAINE HCL (PF) 1 % IJ SOLN: 30.0000 mL | INTRAMUSCULAR | Status: DC | PRN

## 2019-12-24 MED ORDER — ACETAMINOPHEN 325 MG PO TABS: 650.0000 mg | ORAL_TABLET | ORAL | Status: DC | PRN

## 2019-12-24 MED ORDER — PENICILLIN G POT IN DEXTROSE 60000 UNIT/ML IV SOLN
3.0000 10*6.[IU] | INTRAVENOUS | Status: DC
  Administered 2019-12-24 – 2019-12-25 (×3): 3 10*6.[IU] via INTRAVENOUS
  Filled 2019-12-24 (×4): qty 50

## 2019-12-24 MED ORDER — ACETAMINOPHEN 325 MG PO TABS: 650.0000 mg | ORAL_TABLET | Freq: Four times a day (QID) | ORAL | Status: DC | PRN

## 2019-12-24 MED ORDER — MISOPROSTOL 25 MCG QUARTER TABLET
25.0000 ug | ORAL_TABLET | ORAL | Status: DC | PRN
  Administered 2019-12-24 (×2): 25 ug via VAGINAL
  Filled 2019-12-24 (×2): qty 1

## 2019-12-24 MED ORDER — OXYTOCIN BOLUS FROM INFUSION
333.0000 mL | Freq: Once | INTRAVENOUS | Status: AC
  Administered 2019-12-25: 333 mL via INTRAVENOUS

## 2019-12-24 MED ORDER — ONDANSETRON HCL 4 MG/2ML IJ SOLN: 4.0000 mg | Freq: Four times a day (QID) | INTRAMUSCULAR | Status: DC | PRN

## 2019-12-24 MED ORDER — LACTATED RINGERS IV SOLN: 500.0000 mL | INTRAVENOUS | Status: DC | PRN

## 2019-12-24 MED ORDER — SODIUM CHLORIDE 0.9 % IV SOLN
5.0000 10*6.[IU] | Freq: Once | INTRAVENOUS | Status: AC
  Administered 2019-12-24: 5 10*6.[IU] via INTRAVENOUS
  Filled 2019-12-24: qty 5

## 2019-12-24 MED ORDER — MISOPROSTOL 50MCG HALF TABLET
50.0000 ug | ORAL_TABLET | ORAL | Status: DC
  Administered 2019-12-24: 50 ug via ORAL

## 2019-12-24 MED ORDER — LACTATED RINGERS IV SOLN: INTRAVENOUS | Status: DC

## 2019-12-24 MED ORDER — OXYTOCIN-SODIUM CHLORIDE 30-0.9 UT/500ML-% IV SOLN
2.5000 [IU]/h | INTRAVENOUS | Status: DC
  Administered 2019-12-25: 2.5 [IU]/h via INTRAVENOUS
  Filled 2019-12-24: qty 500

## 2019-12-24 MED ORDER — MISOPROSTOL 50MCG HALF TABLET
50.0000 ug | ORAL_TABLET | ORAL | Status: DC
  Filled 2019-12-24: qty 1

## 2019-12-24 MED ORDER — SOD CITRATE-CITRIC ACID 500-334 MG/5ML PO SOLN: 30.0000 mL | ORAL | Status: DC | PRN

## 2019-12-24 MED ORDER — TERBUTALINE SULFATE 1 MG/ML IJ SOLN: 0.2500 mg | Freq: Once | INTRAMUSCULAR | Status: DC | PRN

## 2019-12-24 NOTE — Progress Notes (Signed)
Prenatal Visit Note Date: 12/23/2019 Clinic: [facility]  Subjective:  Ann Bruce is a 34 y.o. [id number] at 107w5d being seen today for ongoing prenatal care.  She is currently monitored for the following issues for this high-risk pregnancy and has Supervision of high risk pregnancy, antepartum; Chronic hypertension during pregnancy, antepartum; Hemorrhoids during pregnancy in second trimester; Group B streptococcal carriage complicating pregnancy; and UTI (urinary tract infection) during pregnancy, third trimester on their problem list.  Patient reports no complaints.   Contractions: Not present. Vag. Bleeding: None.  Movement: Present. Denies leaking of fluid.   The following portions of the patient's history were reviewed and updated as appropriate: allergies, current medications, past family history, past medical history, past social history, past surgical history and problem list. Problem list updated.  Objective:   Vitals:   12/23/19 1539  BP: 124/74  Pulse: 88  Weight: 199 lb 8 oz (90.5 kg)    Fetal Status: Fetal Heart Rate (bpm): 138   Movement: Present     General:  Alert, oriented and cooperative. Patient is in no acute distress.  Skin: Skin is warm and dry. No rash noted.   Cardiovascular: Normal heart rate noted  Respiratory: Normal respiratory effort, no problems with respiration noted  Abdomen: Soft, gravid, appropriate for gestational age. Pain/Pressure: Present     Pelvic:  Cervical exam deferred        Extremities: Normal range of motion.  Edema: Trace  Mental Status: Normal mood and affect. Normal behavior. Normal judgment and thought content.   Urinalysis:      Assessment and Plan:  Pregnancy: G3P1102 at [redacted]w[redacted]d  1. UTI (urinary tract infection) during pregnancy, third trimester toc today - Culture, OB Urine  2. Group B streptococcal carriage complicating pregnancy tx in labor  3. Supervision of high risk pregnancy, antepartum Set up  for IOL next saturday  4. Chronic hypertension during pregnancy, antepartum No on meds, doing well.  hass bpp tomorrow  term labor symptoms and general obstetric precautions including but not limited to vaginal bleeding, contractions, leaking of fluid and fetal movement were reviewed in detail with the patient. Please refer to After Visit Summary for other counseling recommendations.  Return in about 8 days (around 12/31/2019).   Aletha Halim, MD

## 2019-12-24 NOTE — Progress Notes (Signed)
Ann Bruce is a 34 y.o. [id number] at [redacted]w[redacted]d admitted for induction of labor due to Low amniotic fluid and cHTN.  Subjective: Patient is doing well, she is comfortable and not feeling any of her contractions.   Objective: BP (!) 121/62   Pulse 72   Ht 5\' 8"  (1.727 m)   Wt (!) 92.1 kg   LMP 04/03/2019   BMI 30.87 kg/m  No intake/output data recorded.  FHT:  FHR: 125 bpm, variability: moderate,  accelerations:  Present,  decelerations:  Absent UC:   irregular, every 5-10 minutes  SVE:   Dilation: Fingertip Effacement (%): Thick Station: -3 Exam by:: Melina Fiddler, RN   Labs: Lab Results  Component Value Date   WBC 4.8 12/24/2019   HGB 12.3 12/24/2019   HCT 37.3 12/24/2019   MCV 93.7 12/24/2019   PLT 222 12/24/2019    Assessment / Plan: Ann Bruce is a 34 y.o. G3P1102 at [redacted]w[redacted]d here for IOL for cHTN and subjective oligohydramnios.  #Labor: IOL with unfavorable cervix. Giving second Cytotec at this exam. FB PRN. AROM PRN. Pitocin PRN. Anticipating vaginal delivery #Pain:  IV pain meds PRN, epidural upon request #FWB: Cat 1  #GBS/ID: Positive--PCN #COVID: Negative #MOF: breast and bottle #MOC: POPs bridge to BTL #Circ: Yes #cHTN: Pre-E labs unremarkable, patient asymptomatic. Previously on Labetalol 200mg , discontinued on 7/12 d/t fatigue. On admission patient had elevated BP of 140/81, latest pressures wnl. Continue to monitor BP. F/u HELLP labs.   Rise Patience DO PGY1, Family Medicine Resident 12/24/2019, 6:16 PM

## 2019-12-24 NOTE — H&P (Addendum)
LABOR AND DELIVERY ADMISSION HISTORY AND PHYSICAL NOTE  Ann Bruce is a 34 y.o. female [phone] with IUP at [redacted]w[redacted]d by L/6 presenting for IOL for cHTN and subjective oligohydramios.   She reports positive fetal movement. She denies leakage of fluid, vaginal bleeding, or contractions. Patient denies HA, leg swelling, blurred vision, RUQ pain.  She plans on breast and bottle feeding. Her contraception plan is: BTL bridged with POPs.  Prenatal History/Complications: PNC at [facility]:  @[redacted]w[redacted]d , CWD, normal anatomy, cephalic presentation, anterior placenta, 20%ile, EFW [id number]  Pregnancy complications:  - Oligohydramnios - Chronic Hypertension During Pregnancy - Hemorrhoids During Pregnancy in Second Trimester - GBS positive, plan for PCN - UTI During Pregnancy, Third Trimester - Bilateral Ovarian Cyst Surgery 08/04/19  Past Medical History: Past Medical History:  Diagnosis Date   Dyspnea    with pregnancy - exertion   Hypertension    Ovarian cyst    UTI (urinary tract infection)     Past Surgical History: Past Surgical History:  Procedure Laterality Date   APPENDECTOMY     LAPAROTOMY Bilateral 08/04/2019   Procedure: EXPLORATORY LAPAROTOMY WITH BILATERAL OVARIAN CYSTECTOMY;  Surgeon: Osborne Oman, MD;  Location: [facility];  Service: Gynecology;  Laterality: Bilateral;  Dr. Harolyn Rutherford will check fetal heart tones before and after surgery   OVARIAN CYST SURGERY  2009   VAGINAL DELIVERY  2009, 2007   x 2    Obstetrical History: OB History     Gravida  3   Para  2   Term  1   Preterm  1   AB  0   Living  2      SAB  0   TAB  0   Ectopic  0   Multiple  0   Live Births  2           Social History: Social History   Socioeconomic History   Marital status: Single    Spouse name: Not on file   Number of children: 2   Years of education: Not on file   Highest education level: Some college, no degree  Occupational History   Not on file  Tobacco Use    Smoking status: Former Smoker    Packs/day: 0.25    Years: 15.00    Pack years: 3.75    Types: Cigarettes   Smokeless tobacco: Never Used   Tobacco comment: smokes every 3 days  or when stresses, trying to quit  Vaping Use   Vaping Use: Never used  Substance and Sexual Activity   Alcohol use: Not Currently    Comment: occasional wine    Drug use: Never   Sexual activity: Yes    Birth control/protection: None    Comment: approx [redacted] wks gestation  Other Topics Concern   Not on file  Social History Narrative   Not on file   Social Determinants of Health   Financial Resource Strain:    Difficulty of Paying Living Expenses:   Food Insecurity: No Food Insecurity   Worried About Running Out of Food in the Last Year: Never true   Ran Out of Food in the Last Year: Never true  Transportation Needs: No Transportation Needs   Lack of Transportation (Medical): No   Lack of Transportation (Non-Medical): No  Physical Activity:    Days of Exercise per Week:    Minutes of Exercise per Session:   Stress:    Feeling of Stress :   Social Connections:  Frequency of Communication with Friends and Family:    Frequency of Social Gatherings with Friends and Family:    Attends Religious Services:    Active Member of Clubs or Organizations:    Attends Music therapist:    Marital Status:     Family History: Family History  Problem Relation Age of Onset   Hypertension Mother    Hypertension Father     Allergies: No Known Allergies  Medications Prior to Admission  Medication Sig Dispense Refill Last Dose   acetaminophen (TYLENOL) 500 MG tablet Take 1,000 mg by mouth every 6 (six) hours as needed for mild pain or headache.      aspirin EC 81 MG tablet Take 1 tablet (81 mg total) by mouth daily. Take after 12 weeks for prevention of preeclampsia later in pregnancy 300 tablet 2    Blood Pressure Monitoring DEVI 1 Device by Does not apply route once a week. 1 Device 0     calcium carbonate (TUMS - DOSED IN MG ELEMENTAL CALCIUM) 500 MG chewable tablet Chew 1 tablet by mouth daily.      famotidine (PEPCID) 20 MG tablet Take 1 tablet (20 mg total) by mouth 2 (two) times daily. (Patient not taking: Reported on 12/23/2019) 60 tablet 3    ferrous gluconate (FERGON) 324 MG tablet Take 1 tablet (324 mg total) by mouth daily with breakfast. (Patient not taking: Reported on 12/23/2019) 30 tablet 3    labetalol (NORMODYNE) 200 MG tablet Take 1 tablet (200 mg total) by mouth 2 (two) times daily. (Patient not taking: Reported on 12/10/2019) 60 tablet 6    polyethylene glycol powder (MIRALAX) 17 GM/SCOOP powder Take one scoop in fluid daily.  Do not exceed 2 weeks of continued usage. 255 g 0    Prenatal Vit-Fe Fumarate-FA (PRENATAL VITAMINS) 28-0.8 MG TABS Take 1 tablet by mouth daily. 30 tablet 12    witch hazel-glycerin (TUCKS) pad Apply 1 application topically as needed for hemorrhoids. (Patient not taking: Reported on 11/17/2019) 40 each 1     Review of Systems  All systems reviewed and negative except as stated in HPI  Physical Exam Blood pressure 125/77, pulse 89, height 5\' 8"  (1.727 m), weight (!) 92.1 kg, last menstrual period 04/03/2019. General appearance: alert, oriented, NAD Lungs: normal respiratory effort Heart: regular rate Abdomen: soft, non-tender; gravid Extremities: No calf swelling or tenderness  Fetal monitoringBaseline: 125 bpm, Variability: Good {> 6 bpm), Accelerations: Reactive and Decelerations: Absent Uterine activity: Infrequent  Dilation: Fingertip Effacement (%): Thick Station: -3 Exam by:: H.Price, RN  Prenatal labs: ABO, Rh: --/--/O POS (07/30 1224) Antibody: NEG (07/30 1224) Rubella: 22.90 (01/28 1430) RPR: Non Reactive (06/03 0915)  HBsAg: Negative (01/28 1430)  HIV: Non Reactive (06/03 0915)  GBS: Positive/-- (07/22 1711)  2-hr GTT: [phone] Genetic screening:  Low risk female Anatomy US: limited  Prenatal Transfer Tool   Maternal Diabetes: No Genetic Screening: Normal Maternal Ultrasounds/Referrals: Normal Fetal Ultrasounds or other Referrals:  Referred to Materal Fetal Medicine  Maternal Substance Abuse:  No Significant Maternal Medications:  None Significant Maternal Lab Results: Group B Strep positive  Results for orders placed or performed during the hospital encounter of 12/24/19 (from the past 24 hour(s))  CBC   Collection Time: 12/24/19 12:24 PM  Result Value Ref Range   WBC 4.8 4.0 - 10.5 K/uL   RBC 3.98 3.87 - 5.11 MIL/uL   Hemoglobin 12.3 12.0 - 15.0 g/dL   HCT 37.3 36 - 46 %  MCV 93.7 80.0 - 100.0 fL   MCH 30.9 26.0 - 34.0 pg   MCHC 33.0 30.0 - 36.0 g/dL   RDW 13.6 11.5 - 15.5 %   Platelets 222 150 - 400 K/uL   nRBC 0.0 0.0 - 0.2 %  Protein / creatinine ratio, urine   Collection Time: 12/24/19 12:24 PM  Result Value Ref Range   Creatinine, Urine 38.98 mg/dL   Total Protein, Urine <6 mg/dL   Protein Creatinine Ratio        0.00 - 0.15 mg/mg[Cre]  Type and screen [facility]   Collection Time: 12/24/19 12:24 PM  Result Value Ref Range   ABO/RH(D) O POS    Antibody Screen NEG    Sample Expiration      12/27/2019,2359 Performed at [facility], [place] [address] [place], [place] [address]   Results for orders placed or performed in visit on 12/23/19 (from the past 24 hour(s))  POCT urinalysis dip (device)   Collection Time: 12/23/19  3:44 PM  Result Value Ref Range   Glucose, UA NEGATIVE NEGATIVE mg/dL   Bilirubin Urine NEGATIVE NEGATIVE   Ketones, ur TRACE (A) NEGATIVE mg/dL   Specific Gravity, Urine 1.025 1.005 - 1.030   Hgb urine dipstick NEGATIVE NEGATIVE   pH 6.0 5.0 - 8.0   Protein, ur NEGATIVE NEGATIVE mg/dL   Urobilinogen, UA 1.0 0.0 - 1.0 mg/dL   Nitrite NEGATIVE NEGATIVE   Leukocytes,Ua NEGATIVE NEGATIVE    Patient Active Problem List   Diagnosis Date Noted   Oligohydramnios 12/24/2019   UTI (urinary tract infection) during pregnancy,  third trimester 12/23/2019   Group B streptococcal carriage complicating pregnancy 41/93/7902   Hemorrhoids during pregnancy in second trimester 09/26/2019   Supervision of high risk pregnancy, antepartum 06/15/2019   Chronic hypertension during pregnancy, antepartum 06/15/2019    Assessment: Ann Bruce is a 34 y.o. G3P1102 at [redacted]w[redacted]d here for IOL for cHTN and subjective oligohydramnios.  #Labor: IOL with unfavorable cervix. Giving Cytotec with recheck in 4 hours. FB PRN. AROM PRN. Pitocin PRN. Anticipating vaginal delivery #Pain: IV pain meds PRN, epidural upon request #FWB: Cat 1  #GBS/ID: Positive--PCN #COVID: swab pending #MOF: breast and bottle #MOC: POPs bridge to BTL #Circ: Yes #cHTN: Pre-E labs unremarkable, patient asymptomatic. Previously on Labetalol 200mg , discontinued on 7/12 d/t fatigue. On admission patient had elevated BP of 140/81, latest pressures wnl. Continue to monitor BP. F/u HELLP labs.  Rise Patience, DO PGY 1 Family Medicine 12/24/2019, 2:29 PM  Attestation of Supervision of Student:  I confirm that I have verified the information documented in the medical student's note and that I have also personally reperformed the history, physical exam and all medical decision making activities.  I have verified that all services and findings are accurately documented in this student's note; and I agree with management and plan as outlined in the documentation. I have also made any necessary editorial changes.  Randa Ngo, MD OB Fellow, [facility] for [facility], [facility] 12/24/2019 3:56 PM

## 2019-12-25 ENCOUNTER — Encounter (HOSPITAL_COMMUNITY): Payer: Self-pay | Admitting: Obstetrics and Gynecology

## 2019-12-25 ENCOUNTER — Inpatient Hospital Stay (HOSPITAL_COMMUNITY): Payer: Medicaid Other | Admitting: Anesthesiology

## 2019-12-25 DIAGNOSIS — O99824 Streptococcus B carrier state complicating childbirth: Secondary | ICD-10-CM

## 2019-12-25 DIAGNOSIS — Z3A38 38 weeks gestation of pregnancy: Secondary | ICD-10-CM

## 2019-12-25 DIAGNOSIS — O1002 Pre-existing essential hypertension complicating childbirth: Secondary | ICD-10-CM

## 2019-12-25 DIAGNOSIS — B951 Streptococcus, group B, as the cause of diseases classified elsewhere: Secondary | ICD-10-CM

## 2019-12-25 DIAGNOSIS — O4103X0 Oligohydramnios, third trimester, not applicable or unspecified: Secondary | ICD-10-CM

## 2019-12-25 LAB — CBC WITH DIFFERENTIAL/PLATELET
Abs Immature Granulocytes: 0.01 10*3/uL (ref 0.00–0.07)
Basophils Absolute: 0 10*3/uL (ref 0.0–0.1)
Basophils Relative: 0 %
Eosinophils Absolute: 0 10*3/uL (ref 0.0–0.5)
Eosinophils Relative: 0 %
HCT: 35.8 % — ABNORMAL LOW (ref 36.0–46.0)
Hemoglobin: 12.1 g/dL (ref 12.0–15.0)
Immature Granulocytes: 0 %
Lymphocytes Relative: 31 %
Lymphs Abs: 2.1 10*3/uL (ref 0.7–4.0)
MCH: 31.5 pg (ref 26.0–34.0)
MCHC: 33.8 g/dL (ref 30.0–36.0)
MCV: 93.2 fL (ref 80.0–100.0)
Monocytes Absolute: 0.5 10*3/uL (ref 0.1–1.0)
Monocytes Relative: 8 %
Neutro Abs: 4.1 10*3/uL (ref 1.7–7.7)
Neutrophils Relative %: 61 %
Platelets: 219 10*3/uL (ref 150–400)
RBC: 3.84 MIL/uL — ABNORMAL LOW (ref 3.87–5.11)
RDW: 13.7 % (ref 11.5–15.5)
WBC: 6.8 10*3/uL (ref 4.0–10.5)
nRBC: 0 % (ref 0.0–0.2)

## 2019-12-25 LAB — CULTURE, OB URINE

## 2019-12-25 LAB — URINE CULTURE, OB REFLEX

## 2019-12-25 LAB — RPR: RPR Ser Ql: NONREACTIVE

## 2019-12-25 MED ORDER — LACTATED RINGERS IV SOLN
500.0000 mL | Freq: Once | INTRAVENOUS | Status: AC
  Administered 2019-12-25: 500 mL via INTRAVENOUS

## 2019-12-25 MED ORDER — EPHEDRINE 5 MG/ML INJ: 10.0000 mg | INTRAVENOUS | Status: DC | PRN

## 2019-12-25 MED ORDER — COCONUT OIL OIL: 1.0000 "application " | TOPICAL_OIL | Status: DC | PRN

## 2019-12-25 MED ORDER — FENTANYL-BUPIVACAINE-NACL 0.5-0.125-0.9 MG/250ML-% EP SOLN
12.0000 mL/h | EPIDURAL | Status: DC | PRN
  Filled 2019-12-25: qty 250

## 2019-12-25 MED ORDER — PRENATAL MULTIVITAMIN CH
1.0000 | ORAL_TABLET | Freq: Every day | ORAL | Status: DC
  Administered 2019-12-25 – 2019-12-26 (×2): 1 via ORAL
  Filled 2019-12-25 (×2): qty 1

## 2019-12-25 MED ORDER — DIBUCAINE (PERIANAL) 1 % EX OINT: 1.0000 "application " | TOPICAL_OINTMENT | CUTANEOUS | Status: DC | PRN

## 2019-12-25 MED ORDER — ZOLPIDEM TARTRATE 5 MG PO TABS
5.0000 mg | ORAL_TABLET | Freq: Once | ORAL | Status: AC
  Administered 2019-12-25: 5 mg via ORAL
  Filled 2019-12-25 (×2): qty 1

## 2019-12-25 MED ORDER — TETANUS-DIPHTH-ACELL PERTUSSIS 5-2.5-18.5 LF-MCG/0.5 IM SUSP: 0.5000 mL | Freq: Once | INTRAMUSCULAR | Status: DC

## 2019-12-25 MED ORDER — LIDOCAINE HCL (PF) 1 % IJ SOLN
INTRAMUSCULAR | Status: DC | PRN
  Administered 2019-12-25: 10 mL via EPIDURAL
  Administered 2019-12-25: 2 mL via EPIDURAL

## 2019-12-25 MED ORDER — DIPHENHYDRAMINE HCL 50 MG/ML IJ SOLN: 12.5000 mg | INTRAMUSCULAR | Status: DC | PRN

## 2019-12-25 MED ORDER — ACETAMINOPHEN 325 MG PO TABS: 650.0000 mg | ORAL_TABLET | ORAL | Status: DC | PRN

## 2019-12-25 MED ORDER — ZOLPIDEM TARTRATE 5 MG PO TABS: 5.0000 mg | ORAL_TABLET | Freq: Every evening | ORAL | Status: DC | PRN

## 2019-12-25 MED ORDER — FENTANYL CITRATE (PF) 100 MCG/2ML IJ SOLN
100.0000 ug | INTRAMUSCULAR | Status: DC | PRN
  Administered 2019-12-25: 100 ug via INTRAVENOUS
  Filled 2019-12-25 (×3): qty 2

## 2019-12-25 MED ORDER — OXYTOCIN-SODIUM CHLORIDE 30-0.9 UT/500ML-% IV SOLN: 1.0000 m[IU]/min | INTRAVENOUS | Status: DC

## 2019-12-25 MED ORDER — SIMETHICONE 80 MG PO CHEW: 80.0000 mg | CHEWABLE_TABLET | ORAL | Status: DC | PRN

## 2019-12-25 MED ORDER — ONDANSETRON HCL 4 MG/2ML IJ SOLN: 4.0000 mg | INTRAMUSCULAR | Status: DC | PRN

## 2019-12-25 MED ORDER — MEASLES, MUMPS & RUBELLA VAC IJ SOLR: 0.5000 mL | Freq: Once | INTRAMUSCULAR | Status: DC

## 2019-12-25 MED ORDER — SENNOSIDES-DOCUSATE SODIUM 8.6-50 MG PO TABS
2.0000 | ORAL_TABLET | ORAL | Status: DC
  Administered 2019-12-25: 2 via ORAL
  Filled 2019-12-25: qty 2

## 2019-12-25 MED ORDER — IBUPROFEN 600 MG PO TABS
600.0000 mg | ORAL_TABLET | Freq: Four times a day (QID) | ORAL | Status: DC
  Administered 2019-12-25 – 2019-12-26 (×5): 600 mg via ORAL
  Filled 2019-12-25 (×5): qty 1

## 2019-12-25 MED ORDER — PHENYLEPHRINE 40 MCG/ML (10ML) SYRINGE FOR IV PUSH (FOR BLOOD PRESSURE SUPPORT): 80.0000 ug | PREFILLED_SYRINGE | INTRAVENOUS | Status: DC | PRN

## 2019-12-25 MED ORDER — OXYCODONE HCL 5 MG PO TABS: 5.0000 mg | ORAL_TABLET | ORAL | Status: DC | PRN

## 2019-12-25 MED ORDER — BENZOCAINE-MENTHOL 20-0.5 % EX AERO: 1.0000 "application " | INHALATION_SPRAY | CUTANEOUS | Status: DC | PRN

## 2019-12-25 MED ORDER — WITCH HAZEL-GLYCERIN EX PADS: 1.0000 "application " | MEDICATED_PAD | CUTANEOUS | Status: DC | PRN

## 2019-12-25 MED ORDER — ONDANSETRON HCL 4 MG PO TABS: 4.0000 mg | ORAL_TABLET | ORAL | Status: DC | PRN

## 2019-12-25 MED ORDER — SODIUM CHLORIDE (PF) 0.9 % IJ SOLN
INTRAMUSCULAR | Status: DC | PRN
  Administered 2019-12-25: 12 mL/h via EPIDURAL

## 2019-12-25 MED ORDER — FENTANYL CITRATE (PF) 100 MCG/2ML IJ SOLN
INTRAMUSCULAR | Status: AC
  Administered 2019-12-25: 100 ug
  Filled 2019-12-25: qty 2

## 2019-12-25 MED ORDER — FENTANYL CITRATE (PF) 100 MCG/2ML IJ SOLN
INTRAMUSCULAR | Status: DC | PRN
  Administered 2019-12-25: 100 ug via EPIDURAL

## 2019-12-25 MED ORDER — DIPHENHYDRAMINE HCL 25 MG PO CAPS: 25.0000 mg | ORAL_CAPSULE | Freq: Four times a day (QID) | ORAL | Status: DC | PRN

## 2019-12-25 NOTE — Anesthesia Preprocedure Evaluation (Signed)
Anesthesia Evaluation  Patient identified by MRN, date of birth, ID band Patient awake    Reviewed: Allergy & Precautions, Patient's Chart, lab work & pertinent test results, reviewed documented beta blocker date and time   Airway Mallampati: II  TM Distance: >3 FB Neck ROM: Full    Dental no notable dental hx.    Pulmonary shortness of breath and with exertion, Patient abstained from smoking., former smoker,    Pulmonary exam normal breath sounds clear to auscultation       Cardiovascular hypertension, Pt. on medications and Pt. on home beta blockers Normal cardiovascular exam Rhythm:Regular Rate:Normal     Neuro/Psych negative neurological ROS     GI/Hepatic Neg liver ROS, GERD  Medicated and Controlled,  Endo/Other  Obesity BMI 31  Renal/GU negative Renal ROS     Musculoskeletal negative musculoskeletal ROS (+)   Abdominal   Peds  Hematology  (+) Blood dyscrasia, anemia , hct 35.8, plt 219   Anesthesia Other Findings   Reproductive/Obstetrics                             Anesthesia Physical Anesthesia Plan  ASA: III and emergent  Anesthesia Plan: Epidural   Post-op Pain Management:    Induction:   PONV Risk Score and Plan: 2  Airway Management Planned: Natural Airway  Additional Equipment: None  Intra-op Plan:   Post-operative Plan:   Informed Consent: I have reviewed the patients History and Physical, chart, labs and discussed the procedure including the risks, benefits and alternatives for the proposed anesthesia with the patient or authorized representative who has indicated his/her understanding and acceptance.       Plan Discussed with:   Anesthesia Plan Comments:         Anesthesia Quick Evaluation

## 2019-12-25 NOTE — Discharge Summary (Signed)
Postpartum Discharge Summary    Patient Name: Ann Bruce DOB: 1986/04/21 MRN: [id number]  Date of admission: 12/24/2019 Delivery date:12/25/2019  Delivering provider: Serita Grammes D  Date of discharge: 12/26/2019  Admitting diagnosis: Oligohydramnios [O41.00X0] Intrauterine pregnancy: [redacted]w[redacted]d    Secondary diagnosis:  Active Problems:   Supervision of high risk pregnancy, antepartum   Chronic hypertension during pregnancy, antepartum   Group B streptococcal carriage complicating pregnancy   UTI (urinary tract infection) during pregnancy, third trimester   Oligohydramnios  Additional problems: none    Discharge diagnosis: Term Pregnancy Delivered and CHTN                                              Post partum procedures:None Augmentation: Cytotec and IP Foley Complications: None  Hospital course: Induction of Labor With Vaginal Delivery   34y.o. yo [id number] [id number] admitted to the hospital 12/24/2019 for induction of labor.  Indication for induction: subjective oligo, also with cHTN (no meds). She was having a scheduled BPP with MFM when her fluid was noted to be low and she was sent for IOL. Initially she had cytotec x 3 doses followed by cervical foley placement, and from that point labored spontaneously with SROM.  Membrane Rupture Time/Date: 4:03 AM ,12/25/2019   Delivery Method:Vaginal, Spontaneous  Episiotomy: None  Lacerations:  None  Details of delivery can be found in separate delivery note.  Patient had a routine postpartum course. BP's high end of normal PP, started on Nifedipine 30 XL daily. Patient is discharged home 12/26/19.  Newborn Data: Birth date:12/25/2019  Birth time:4:45 AM  Gender:Female  Living status:Living  Apgars:1 ,9  Weight:2665 g  2665gm (5lb 14oz)  Magnesium Sulfate received: No BMZ received: No Rhophylac:N/A MMR:N/A T-DaP:Given prenatally Flu: No Transfusion:No  Physical exam  Vitals:   12/25/19 0743 12/25/19 0845 12/25/19 2030  12/26/19 0547  BP: (!) 133/88 (!) 131/88 (!) 135/89 (!) 132/86  Pulse: 73 73 70 66  Resp: '18 18 18 18  ' Temp: 98.6 F (37 C) 98.7 F (37.1 C) 98 F (36.7 C) 98.5 F (36.9 C)  TempSrc: Oral Oral Oral Oral  SpO2:   100% 100%  Weight:      Height:       General: alert, cooperative and no distress Lochia: appropriate Uterine Fundus: firm Incision: N/A DVT Evaluation: No evidence of DVT seen on physical exam. No significant calf/ankle edema. Labs: Lab Results  Component Value Date   WBC 6.8 12/25/2019   HGB 12.1 12/25/2019   HCT 35.8 (L) 12/25/2019   MCV 93.2 12/25/2019   PLT 219 12/25/2019   CMP Latest Ref Rng & Units 12/24/2019  Glucose 70 - 99 mg/dL 147(H)  BUN 6 - 20 mg/dL 6  Creatinine 0.44 - 1.00 mg/dL 0.57  Sodium 135 - 145 mmol/L 135  Potassium 3.5 - 5.1 mmol/L 3.4(L)  Chloride 98 - 111 mmol/L 105  CO2 22 - 32 mmol/L 19(L)  Calcium 8.9 - 10.3 mg/dL 8.6(L)  Total Protein 6.5 - 8.1 g/dL 5.9(L)  Total Bilirubin 0.3 - 1.2 mg/dL 0.2(L)  Alkaline Phos 38 - 126 U/L 83  AST 15 - 41 U/L 18  ALT 0 - 44 U/L 13   Edinburgh Score: Edinburgh Postnatal Depression Scale Screening Tool 12/25/2019  I have been able to laugh and see the funny side of things. 0  I have looked forward with enjoyment to things. 0  I have blamed myself unnecessarily when things went wrong. 1  I have been anxious or worried for no good reason. 0  I have felt scared or panicky for no good reason. 1  Things have been getting on top of me. 0  I have been so unhappy that I have had difficulty sleeping. 0  I have felt sad or miserable. 0  I have been so unhappy that I have been crying. 0  The thought of harming myself has occurred to me. 0  Edinburgh Postnatal Depression Scale Total 2     After visit meds:  Allergies as of 12/26/2019   No Known Allergies     Medication List    STOP taking these medications   aspirin EC 81 MG tablet   labetalol 200 MG tablet Commonly known as: NORMODYNE      TAKE these medications   acetaminophen 325 MG tablet Commonly known as: Tylenol Take 2 tablets (650 mg total) by mouth every 4 (four) hours as needed (for pain scale < 4). What changed:   medication strength  how much to take  when to take this  reasons to take this   Blood Pressure Monitoring Devi 1 Device by Does not apply route once a week.   calcium carbonate 500 MG chewable tablet Commonly known as: TUMS - dosed in mg elemental calcium Chew 1 tablet by mouth daily.   famotidine 20 MG tablet Commonly known as: PEPCID Take 1 tablet (20 mg total) by mouth 2 (two) times daily.   ferrous gluconate 324 MG tablet Commonly known as: FERGON Take 1 tablet (324 mg total) by mouth daily with breakfast.   ibuprofen 600 MG tablet Commonly known as: ADVIL Take 1 tablet (600 mg total) by mouth every 6 (six) hours as needed.   NIFEdipine 30 MG 24 hr tablet Commonly known as: ADALAT CC Take 1 tablet (30 mg total) by mouth daily.   polyethylene glycol powder 17 GM/SCOOP powder Commonly known as: MiraLax Take one scoop in fluid daily.  Do not exceed 2 weeks of continued usage.   Prenatal Vitamins 28-0.8 MG Tabs Take 1 tablet by mouth daily.   witch hazel-glycerin pad Commonly known as: TUCKS Apply 1 application topically as needed for hemorrhoids.        Discharge home in stable condition Infant Feeding: Bottle and Breast Infant Disposition:home with mother Discharge instruction: per After Visit Summary and Postpartum booklet. Activity: Advance as tolerated. Pelvic rest for 6 weeks.  Diet: routine diet Future Appointments: Future Appointments  Date Time Provider [place]  12/30/2019  3:35 PM Aletha Halim, MD [facility]  12/31/2019  3:30 PM [facility] NURSE [facility]  12/31/2019  3:45 PM [facility] US4 [facility]  01/06/2020  3:35 PM Ernestina Patches, Juanita Craver, MD [facility]   Follow up Visit:  Myrtis Ser, CNM  P [facility] Admin Pool Please schedule this  patient for Postpartum visit in: 1 week for BP check with the following provider: MD- for preop BTL visit  In-Person  For C/S patients schedule nurse incision check in weeks 2 weeks: no  High risk pregnancy complicated by: cHTN  Delivery mode: SVD  Anticipated Birth Control: Plans Interval BTL (signed papers 12/03/19)  PP Procedures needed: BP check & BTL preop in 1wk  Schedule Integrated [place] visit: no   12/26/2019 Clarnce Flock, MD

## 2019-12-25 NOTE — Progress Notes (Signed)
Patient ID: Ann Bruce, female   DOB: [birth year], 34 y.o.   MRN: [id number]  S/p cytotec x 2 doses vag and 1 dose buccal; feeling some cramps; ready for cervical foley placement  BPs 130/71, 141/72 FHR 130s, +accels, no decels Ctx irreg, mild Cx 1/50/vtx -2, soft  IUP@38 .0wks cHTN- stable Oligo Cx unfavorable  Cervical foley inserted without difficulty and inflated with 60cc fluid Plan on Pitocin when it comes out Anticipate vag del  Myrtis Ser [facility] 12/25/2019 12:35 AM

## 2019-12-25 NOTE — Anesthesia Procedure Notes (Signed)
Epidural Patient location during procedure: OB Start time: 12/25/2019 3:47 AM End time: 12/25/2019 3:57 AM  Staffing Anesthesiologist: Pervis Hocking, DO Performed: anesthesiologist   Preanesthetic Checklist Completed: patient identified, IV checked, risks and benefits discussed, monitors and equipment checked, pre-op evaluation and timeout performed  Epidural Patient position: sitting Prep: DuraPrep and site prepped and draped Patient monitoring: continuous pulse ox, blood pressure, heart rate and cardiac monitor Approach: midline Location: L3-L4 Injection technique: LOR air  Needle:  Needle type: Tuohy  Needle gauge: 17 G Needle length: 9 cm Needle insertion depth: 8 cm Catheter type: closed end flexible Catheter size: 19 Gauge Catheter at skin depth: 13 cm Test dose: negative  Assessment Sensory level: T8 Events: blood not aspirated, injection not painful, no injection resistance, no paresthesia and negative IV test  Additional Notes Patient identified. Risks/Benefits/Options discussed with patient including but not limited to bleeding, infection, nerve damage, paralysis, failed block, incomplete pain control, headache, blood pressure changes, nausea, vomiting, reactions to medication both or allergic, itching and postpartum back pain. Confirmed with bedside nurse the patient's most recent platelet count. Confirmed with patient that they are not currently taking any anticoagulation, have any bleeding history or any family history of bleeding disorders. Patient expressed understanding and wished to proceed. All questions were answered. Sterile technique was used throughout the entire procedure. Please see nursing notes for vital signs. Test dose was given through epidural catheter and negative prior to continuing to dose epidural or start infusion. Warning signs of high block given to the patient including shortness of breath, tingling/numbness in hands, complete motor  block, or any concerning symptoms with instructions to call for help. Patient was given instructions on fall risk and not to get out of bed. All questions and concerns addressed with instructions to call with any issues or inadequate analgesia.  Reason for block:procedure for pain

## 2019-12-26 MED ORDER — IBUPROFEN 600 MG PO TABS: 600.0000 mg | ORAL_TABLET | Freq: Four times a day (QID) | ORAL | 0 refills | Status: DC | PRN

## 2019-12-26 MED ORDER — NIFEDIPINE ER 30 MG PO TB24: 30.0000 mg | ORAL_TABLET | Freq: Every day | ORAL | 2 refills | Status: DC

## 2019-12-26 MED ORDER — NIFEDIPINE ER OSMOTIC RELEASE 30 MG PO TB24
30.0000 mg | ORAL_TABLET | Freq: Every day | ORAL | Status: DC
  Administered 2019-12-26: 30 mg via ORAL
  Filled 2019-12-26: qty 1

## 2019-12-26 MED ORDER — ACETAMINOPHEN 325 MG PO TABS: 650.0000 mg | ORAL_TABLET | ORAL | 0 refills | Status: DC | PRN

## 2019-12-26 NOTE — Anesthesia Postprocedure Evaluation (Signed)
Anesthesia Post Note  Patient: Ann Bruce  Procedure(s) Performed: AN AD HOC LABOR EPIDURAL     Patient location during evaluation: Other Anesthesia Type: Epidural Level of consciousness: awake and alert Pain management: pain level controlled Vital Signs Assessment: post-procedure vital signs reviewed and stable Respiratory status: spontaneous breathing, nonlabored ventilation and respiratory function stable Cardiovascular status: stable Postop Assessment: epidural receding, patient able to bend at knees and able to ambulate Anesthetic complications: no Comments: Patient discharged home prior to being evaluated. No issues noted in chart prior to discharge.   No complications documented.  Last Vitals:  Vitals:   12/25/19 2030 12/26/19 0547  BP: (!) 135/89 (!) 132/86  Pulse: 70 66  Resp: 18 18  Temp: 36.7 C 36.9 C  SpO2: 100% 100%    Last Pain:  Vitals:   12/26/19 1145  TempSrc:   PainSc: 0-No pain   Pain Goal:                   Everette Rank

## 2019-12-26 NOTE — Discharge Instructions (Signed)

## 2019-12-30 ENCOUNTER — Telehealth: Payer: Medicaid Other | Admitting: Obstetrics and Gynecology

## 2019-12-31 ENCOUNTER — Ambulatory Visit: Payer: Medicaid Other

## 2020-01-03 ENCOUNTER — Ambulatory Visit: Payer: Medicaid Other

## 2020-01-04 ENCOUNTER — Ambulatory Visit (HOSPITAL_BASED_OUTPATIENT_CLINIC_OR_DEPARTMENT_OTHER): Admit: 2020-01-04 | Payer: Medicaid Other | Admitting: Obstetrics and Gynecology

## 2020-01-04 ENCOUNTER — Encounter (HOSPITAL_BASED_OUTPATIENT_CLINIC_OR_DEPARTMENT_OTHER): Payer: Self-pay

## 2020-01-04 SURGERY — LIGATION, FALLOPIAN TUBE, LAPAROSCOPIC
Anesthesia: Choice | Laterality: Bilateral

## 2020-01-05 ENCOUNTER — Ambulatory Visit: Payer: Medicaid Other

## 2020-01-06 ENCOUNTER — Encounter: Payer: Medicaid Other | Admitting: Family Medicine

## 2020-01-06 ENCOUNTER — Telehealth (INDEPENDENT_AMBULATORY_CARE_PROVIDER_SITE_OTHER): Payer: Medicaid Other

## 2020-01-06 ENCOUNTER — Other Ambulatory Visit: Payer: Self-pay

## 2020-01-06 DIAGNOSIS — Z9889 Other specified postprocedural states: Secondary | ICD-10-CM

## 2020-01-06 DIAGNOSIS — Z0131 Encounter for examination of blood pressure with abnormal findings: Secondary | ICD-10-CM

## 2020-01-06 DIAGNOSIS — Z0130 Encounter for examination of blood pressure without abnormal findings: Secondary | ICD-10-CM

## 2020-01-06 NOTE — Progress Notes (Addendum)
I connected with  Ann Bruce on 01/06/20 at 1316 by telephone and verified that I am speaking with the correct person using two identifiers.   Pt requested virtual visit today to complete BP check. Pt is s/p vaginal delivery on 12/24/19 at 38w. BP today is 148/97, HR 76. Denies HA, blurry vision, dizziness, and swelling at this time. Reports HA every other day since discharge that improves with ibuprofen. Taking Nifedipine 30 mg daily; last dose taken yesterday at approx. 11AM.  Reviewed with Kennon Rounds, MD who recommends pt continue current medication and follow up with virtual appt for BP check in 1 week. Continue ibuprofen for HA, states this may be a side effect of medication. Provider recommendation given to patient.   Pt is breastfeeding. Reports some issue with latch. Currently pumping and feeding by bottle. Offered lactation services, pt agreeable. Report given to Ivin Booty, RN to follow up with lactation appt.   [name], RN 01/06/2020  1:16 PM  Addendum: Pt is located at home and provider at [employer] for [employer] at [facility] at time of visit.   Apolonio Schneiders RN 01/07/20 0900

## 2020-01-07 NOTE — Progress Notes (Signed)
Patient seen and assessed by nursing staff.  Agree with documentation and plan.  

## 2020-01-14 ENCOUNTER — Other Ambulatory Visit: Payer: Self-pay

## 2020-01-14 ENCOUNTER — Ambulatory Visit: Payer: Medicaid Other | Admitting: *Deleted

## 2020-01-14 DIAGNOSIS — Z0130 Encounter for examination of blood pressure without abnormal findings: Secondary | ICD-10-CM

## 2020-01-14 NOTE — Progress Notes (Signed)
Called pt in order to begin Virtual visit as scheduled. She stated that she could not check her BP and complete the visit because she was busy with the baby. Pt stated that she is not having problems and is taking the Nifedipine. Medication reconciliation completed. Pt agreed to be called back @ 1100 for BP check.  [place] pt and left VM message with apology that I was not able to call her back at our agreed time. I stated that pt should check her BP once daily and I will call her on Monday. If she experiences headache, blurry vision, dizziness or spots in front of her eyes she should go to [facility] for evaluation.

## 2020-01-14 NOTE — Progress Notes (Signed)
Patient seen and assessed by nursing staff.  Agree with documentation and plan.  

## 2020-01-26 ENCOUNTER — Other Ambulatory Visit: Payer: Self-pay

## 2020-01-26 ENCOUNTER — Encounter: Payer: Self-pay | Admitting: Obstetrics and Gynecology

## 2020-01-26 ENCOUNTER — Telehealth (INDEPENDENT_AMBULATORY_CARE_PROVIDER_SITE_OTHER): Payer: Medicaid Other | Admitting: Obstetrics and Gynecology

## 2020-01-26 DIAGNOSIS — Z3009 Encounter for other general counseling and advice on contraception: Secondary | ICD-10-CM

## 2020-01-26 DIAGNOSIS — O1093 Unspecified pre-existing hypertension complicating the puerperium: Secondary | ICD-10-CM | POA: Diagnosis not present

## 2020-01-26 MED ORDER — NORETHIN ACE-ETH ESTRAD-FE 1-20 MG-MCG(24) PO TABS: 1.0000 | ORAL_TABLET | Freq: Every day | ORAL | 11 refills | Status: DC

## 2020-01-26 NOTE — Progress Notes (Signed)
TELEHEALTH POSTPARTUM VISIT ENCOUNTER NOTE  I connected with@ on 01/26/20 at  1:15 PM EDT by telephone at home and verified that I am speaking with the correct person using two identifiers.   I discussed the limitations, risks, security and privacy concerns of performing an evaluation and management service by telephone and the availability of in person appointments. I also discussed with the patient that there may be a patient responsible charge related to this service. The patient expressed understanding and agreed to proceed.  Appointment Date: 01/26/2020  OBGYN Clinic: [facility]  Chief Complaint:  Postpartum Visit  History of Present Illness: Ann Bruce is a 34 y.o. African-American [phone] (Patient's last menstrual period was 04/03/2019.), seen for the above chief complaint. Her past medical history is significant for [facility]   She is s/p normal spontaneous vaginal delivery on 01/25/20 at 38 weeks; she was discharged to home on PPD#2. Pregnancy complicated by [facility]. Baby is doing well.  No complaints. Denies any bowel or bladder dysfunction. No HA or visual changes. Monitoring BP at home 120-130's/80's  Vaginal bleeding or discharge: No  Mode of feeding infant: Bottle Intercourse: Yes  Contraception: oral contraceptives (estrogen/progesterone) PP depression s/s: No .  Any bowel or bladder issues: No  Pap smear: HPV +, cytology negative, Jun 24 2019  Review of Systems:  Her 12 point review of systems is negative or as noted in the History of Present Illness.  Patient Active Problem List   Diagnosis Date Noted  . Encounter for postpartum visit 01/26/2020  . Unwanted fertility 01/26/2020  . Chronic hypertension during pregnancy, antepartum 06/15/2019    Medications Dorann Ou. Sindt had no medications administered during this visit. Current Outpatient Medications  Medication Sig Dispense Refill  . NIFEdipine (ADALAT CC) 30 MG 24 hr tablet Take 1 tablet (30 mg total) by mouth  daily. 30 tablet 2  . Prenatal Vit-Fe Fumarate-FA (PRENATAL VITAMINS) 28-0.8 MG TABS Take 1 tablet by mouth daily. 30 tablet 12  . PROCTOFOAM HC rectal foam Place 1 applicator rectally 2 (two) times daily.    Marland Kitchen witch hazel-glycerin (TUCKS) pad Apply 1 application topically as needed for hemorrhoids. 40 each 1   No current facility-administered medications for this visit.    Allergies Patient has no known allergies.  Physical Exam:  General:  Alert, oriented and cooperative.   Mental Status: Normal mood and affect perceived. Normal judgment and thought content.  Rest of physical exam deferred due to type of encounter  PP Depression Screening:    Edinburgh Postnatal Depression Scale - 01/26/20 1328      Edinburgh Postnatal Depression Scale:  In the Past 7 Days   I have been able to laugh and see the funny side of things. 0    I have looked forward with enjoyment to things. 1    I have blamed myself unnecessarily when things went wrong. 0    I have been anxious or worried for no good reason. 0    I have felt scared or panicky for no good reason. 0    Things have been getting on top of me. 0    I have been so unhappy that I have had difficulty sleeping. 0    I have felt sad or miserable. 0    I have been so unhappy that I have been crying. 0    The thought of harming myself has occurred to me. 0    Edinburgh Postnatal Depression Scale Total  1           Assessment:Patient is a 34 y.o. [id number] who is 4 weeks postpartum from a normal spontaneous vaginal delivery.  She is doing well.   Plan:  1. Encounter for postpartum visit Doing well. Continue with Procardia for BP. To follow up with PCP for continued management  2. Unwanted fertility BTL papers signed 12/03/19 Will schedule. Pt desires OCP's in the meantime. U/R/B reviewed with pt. Back up method advised   RTC post op   I discussed the assessment and treatment plan with the patient. The patient was provided an opportunity  to ask questions and all were answered. The patient agreed with the plan and demonstrated an understanding of the instructions.   The patient was advised to call back or seek an in-person evaluation/go to the ED for any concerning postpartum symptoms.  I provided 10 minutes of non-face-to-face time during this encounter.   [name], MD [facility] [place] [place]

## 2020-03-14 ENCOUNTER — Encounter (HOSPITAL_BASED_OUTPATIENT_CLINIC_OR_DEPARTMENT_OTHER): Payer: Self-pay | Admitting: Obstetrics and Gynecology

## 2020-03-14 ENCOUNTER — Other Ambulatory Visit: Payer: Self-pay

## 2020-03-18 ENCOUNTER — Other Ambulatory Visit (HOSPITAL_COMMUNITY): Payer: Medicaid Other | Attending: Obstetrics and Gynecology

## 2020-03-19 NOTE — H&P (Addendum)
Ann Bruce is an 34 y.o. female with a desire of unwanted fertility. BTL papers signed 12/03/19.   Menstrual History: Menarche age: 39 No LMP recorded.    Past Medical History:  Diagnosis Date   Dyspnea    with pregnancy - exertion   Hypertension    Ovarian cyst    UTI (urinary tract infection)     Past Surgical History:  Procedure Laterality Date   APPENDECTOMY     LAPAROTOMY Bilateral 08/04/2019   Procedure: EXPLORATORY LAPAROTOMY WITH BILATERAL OVARIAN CYSTECTOMY;  Surgeon: Osborne Oman, MD;  Location: [place];  Service: Gynecology;  Laterality: Bilateral;  Dr. Harolyn Rutherford will check fetal heart tones before and after surgery   OVARIAN CYST SURGERY  2009   VAGINAL DELIVERY  2009, 2007   x 2    Family History  Problem Relation Age of Onset   Hypertension Mother    Hypertension Father     Social History:  reports that she has quit smoking. Her smoking use included cigarettes. She has a 3.75 pack-year smoking history. She has never used smokeless tobacco. She reports previous alcohol use. She reports that she does not use drugs.  Allergies: No Known Allergies  No medications prior to admission.    Review of Systems  Constitutional: Negative.   Respiratory: Negative.   Cardiovascular: Negative.   Gastrointestinal: Negative.   Genitourinary: Negative.     Height 5\' 8"  (1.727 m), weight 79.4 kg, not currently breastfeeding. Physical Exam Constitutional:      Appearance: Normal appearance.  Cardiovascular:     Rate and Rhythm: Normal rate and regular rhythm.     Heart sounds: Normal heart sounds.  Pulmonary:     Effort: Pulmonary effort is normal.     Breath sounds: Normal breath sounds.  Abdominal:     General: Bowel sounds are normal.     Palpations: Abdomen is soft.  Genitourinary:    Comments: Nl EGBUS, uterus small mobile, no masses or tenderness Neurological:     Mental Status: She is alert.     No results found for this or any previous  visit (from the past 24 hour(s)).  No results found.  Assessment/Plan: Unwanted fertility  Patient desires bilateral tubal sterilization.  Other reversible forms of contraception were discussed with patient; she declines all other modalities. Discussed bilateral tubal sterilization in detail; discussed options of laparoscopic bilateral tubal sterilization using Filshie clips vs laparoscopic bilateral salpingectomy. Risks and benefits discussed in detail including but not limited to: risk of regret, permanence of method, bleeding, infection, injury to surrounding organs and need for additional procedures.  Failure risk of 1-2 % for Filshie clips and <1% for bilateral salpingectomy with increased risk of ectopic gestation if pregnancy occurs was also discussed with patient.  Also discussed possible reduction of risk of ovarian cancer via bilateral salpingectomy given that a growing body of knowledge reveals that the majority of cases of high grade serous ovarian cancer actually are actually  cancers arising from the fimbriated end of the fallopian tubes. Emphasized that removal of fallopian tubes do not result in any known hormonal imbalance.  Patient verbalized understanding of these risks and benefits and wants to proceed with sterilization via Filshie chips   Medicaid papers have been signed , patient understands that surgery will be scheduled at least 30 days after the day papers are signed as per Medicaid guidelines.      Ann Bruce 03/19/2020, 11:01 AM

## 2020-03-20 ENCOUNTER — Other Ambulatory Visit (HOSPITAL_COMMUNITY)
Admission: RE | Admit: 2020-03-20 | Discharge: 2020-03-20 | Disposition: A | Discharge status: Home / Self Care | Payer: Medicaid Other | Source: Ambulatory Visit | Attending: Obstetrics and Gynecology | Admitting: Obstetrics and Gynecology

## 2020-03-20 DIAGNOSIS — Z01812 Encounter for preprocedural laboratory examination: Secondary | ICD-10-CM | POA: Diagnosis not present

## 2020-03-20 DIAGNOSIS — Z20822 Contact with and (suspected) exposure to covid-19: Secondary | ICD-10-CM | POA: Diagnosis not present

## 2020-03-20 LAB — SARS CORONAVIRUS 2 (TAT 6-24 HRS): SARS Coronavirus 2: NEGATIVE

## 2020-03-21 NOTE — Progress Notes (Signed)
Left message reminding patient to come in for labs.

## 2020-03-22 ENCOUNTER — Encounter (HOSPITAL_BASED_OUTPATIENT_CLINIC_OR_DEPARTMENT_OTHER)
Admission: RE | Disposition: A | Discharge status: Home / Self Care | Payer: Self-pay | Source: Home / Self Care | Attending: Obstetrics and Gynecology

## 2020-03-22 ENCOUNTER — Ambulatory Visit (HOSPITAL_BASED_OUTPATIENT_CLINIC_OR_DEPARTMENT_OTHER): Payer: Medicaid Other | Admitting: Anesthesiology

## 2020-03-22 ENCOUNTER — Other Ambulatory Visit: Payer: Self-pay

## 2020-03-22 ENCOUNTER — Ambulatory Visit (HOSPITAL_BASED_OUTPATIENT_CLINIC_OR_DEPARTMENT_OTHER)
Admission: RE | Admit: 2020-03-22 | Discharge: 2020-03-22 | Disposition: A | Discharge status: Home / Self Care | Payer: Medicaid Other | Attending: Obstetrics and Gynecology | Admitting: Obstetrics and Gynecology

## 2020-03-22 ENCOUNTER — Encounter (HOSPITAL_BASED_OUTPATIENT_CLINIC_OR_DEPARTMENT_OTHER): Payer: Self-pay | Admitting: Obstetrics and Gynecology

## 2020-03-22 DIAGNOSIS — I10 Essential (primary) hypertension: Secondary | ICD-10-CM | POA: Diagnosis not present

## 2020-03-22 DIAGNOSIS — Z87891 Personal history of nicotine dependence: Secondary | ICD-10-CM | POA: Diagnosis not present

## 2020-03-22 DIAGNOSIS — Z302 Encounter for sterilization: Secondary | ICD-10-CM | POA: Insufficient documentation

## 2020-03-22 HISTORY — PX: LAPAROSCOPIC TUBAL LIGATION: SHX1937

## 2020-03-22 LAB — CBC
HCT: 41.6 % (ref 36.0–46.0)
Hemoglobin: 13.8 g/dL (ref 12.0–15.0)
MCH: 30.1 pg (ref 26.0–34.0)
MCHC: 33.2 g/dL (ref 30.0–36.0)
MCV: 90.6 fL (ref 80.0–100.0)
Platelets: 227 10*3/uL (ref 150–400)
RBC: 4.59 MIL/uL (ref 3.87–5.11)
RDW: 13.9 % (ref 11.5–15.5)
WBC: 4.1 10*3/uL (ref 4.0–10.5)
nRBC: 0 % (ref 0.0–0.2)

## 2020-03-22 LAB — POCT PREGNANCY, URINE: Preg Test, Ur: NEGATIVE

## 2020-03-22 SURGERY — LIGATION, FALLOPIAN TUBE, LAPAROSCOPIC
Anesthesia: General | Laterality: Bilateral

## 2020-03-22 MED ORDER — KETOROLAC TROMETHAMINE 15 MG/ML IJ SOLN
15.0000 mg | INTRAMUSCULAR | Status: AC
  Administered 2020-03-22: 15 mg via INTRAVENOUS

## 2020-03-22 MED ORDER — DEXAMETHASONE SODIUM PHOSPHATE 4 MG/ML IJ SOLN
INTRAMUSCULAR | Status: DC | PRN
  Administered 2020-03-22: 5 mg via INTRAVENOUS

## 2020-03-22 MED ORDER — OXYCODONE HCL 5 MG PO TABS
5.0000 mg | ORAL_TABLET | Freq: Once | ORAL | Status: AC | PRN
  Administered 2020-03-22: 5 mg via ORAL

## 2020-03-22 MED ORDER — LACTATED RINGERS IV SOLN: INTRAVENOUS | Status: DC

## 2020-03-22 MED ORDER — IBUPROFEN 800 MG PO TABS: 800.0000 mg | ORAL_TABLET | Freq: Three times a day (TID) | ORAL | 0 refills | Status: DC | PRN

## 2020-03-22 MED ORDER — FENTANYL CITRATE (PF) 100 MCG/2ML IJ SOLN
INTRAMUSCULAR | Status: DC | PRN
  Administered 2020-03-22: 100 ug via INTRAVENOUS

## 2020-03-22 MED ORDER — ACETAMINOPHEN 10 MG/ML IV SOLN: 1000.0000 mg | Freq: Once | INTRAVENOUS | Status: DC | PRN

## 2020-03-22 MED ORDER — KETOROLAC TROMETHAMINE 30 MG/ML IJ SOLN
INTRAMUSCULAR | Status: DC | PRN
  Administered 2020-03-22: 15 mg via INTRAVENOUS

## 2020-03-22 MED ORDER — LIDOCAINE HCL (CARDIAC) PF 100 MG/5ML IV SOSY
PREFILLED_SYRINGE | INTRAVENOUS | Status: DC | PRN
  Administered 2020-03-22: 100 mg via INTRAVENOUS

## 2020-03-22 MED ORDER — SUGAMMADEX SODIUM 500 MG/5ML IV SOLN
INTRAVENOUS | Status: DC | PRN
  Administered 2020-03-22: 300 mg via INTRAVENOUS

## 2020-03-22 MED ORDER — FENTANYL CITRATE (PF) 100 MCG/2ML IJ SOLN
INTRAMUSCULAR | Status: AC
  Filled 2020-03-22: qty 2

## 2020-03-22 MED ORDER — BUPIVACAINE HCL 0.5 % IJ SOLN
INTRAMUSCULAR | Status: DC | PRN
  Administered 2020-03-22: 20 mL

## 2020-03-22 MED ORDER — ACETAMINOPHEN 500 MG PO TABS
1000.0000 mg | ORAL_TABLET | ORAL | Status: AC
  Administered 2020-03-22: 1000 mg via ORAL

## 2020-03-22 MED ORDER — OXYCODONE HCL 5 MG/5ML PO SOLN: 5.0000 mg | Freq: Once | ORAL | Status: AC | PRN

## 2020-03-22 MED ORDER — PROPOFOL 10 MG/ML IV BOLUS
INTRAVENOUS | Status: DC | PRN
  Administered 2020-03-22: 150 mg via INTRAVENOUS

## 2020-03-22 MED ORDER — ACETAMINOPHEN 500 MG PO TABS
ORAL_TABLET | ORAL | Status: AC
  Filled 2020-03-22: qty 2

## 2020-03-22 MED ORDER — ROCURONIUM BROMIDE 10 MG/ML (PF) SYRINGE
PREFILLED_SYRINGE | INTRAVENOUS | Status: AC
  Filled 2020-03-22: qty 10

## 2020-03-22 MED ORDER — ONDANSETRON HCL 4 MG/2ML IJ SOLN
INTRAMUSCULAR | Status: AC
  Filled 2020-03-22: qty 2

## 2020-03-22 MED ORDER — ACETAMINOPHEN 500 MG PO TABS: 1000.0000 mg | ORAL_TABLET | Freq: Once | ORAL | Status: DC | PRN

## 2020-03-22 MED ORDER — PROPOFOL 10 MG/ML IV BOLUS
INTRAVENOUS | Status: AC
  Filled 2020-03-22: qty 20

## 2020-03-22 MED ORDER — MIDAZOLAM HCL 2 MG/2ML IJ SOLN
INTRAMUSCULAR | Status: AC
  Filled 2020-03-22: qty 2

## 2020-03-22 MED ORDER — DEXAMETHASONE SODIUM PHOSPHATE 10 MG/ML IJ SOLN
INTRAMUSCULAR | Status: AC
  Filled 2020-03-22: qty 1

## 2020-03-22 MED ORDER — ONDANSETRON HCL 4 MG/2ML IJ SOLN
INTRAMUSCULAR | Status: DC | PRN
  Administered 2020-03-22: 4 mg via INTRAVENOUS

## 2020-03-22 MED ORDER — SOD CITRATE-CITRIC ACID 500-334 MG/5ML PO SOLN
ORAL | Status: AC
  Filled 2020-03-22: qty 15

## 2020-03-22 MED ORDER — MIDAZOLAM HCL 5 MG/5ML IJ SOLN
INTRAMUSCULAR | Status: DC | PRN
  Administered 2020-03-22: 2 mg via INTRAVENOUS

## 2020-03-22 MED ORDER — LIDOCAINE 2% (20 MG/ML) 5 ML SYRINGE
INTRAMUSCULAR | Status: AC
  Filled 2020-03-22: qty 5

## 2020-03-22 MED ORDER — BUPIVACAINE HCL (PF) 0.5 % IJ SOLN
INTRAMUSCULAR | Status: AC
  Filled 2020-03-22: qty 30

## 2020-03-22 MED ORDER — OXYCODONE HCL 5 MG PO TABS
ORAL_TABLET | ORAL | Status: AC
  Filled 2020-03-22: qty 1

## 2020-03-22 MED ORDER — KETOROLAC TROMETHAMINE 15 MG/ML IJ SOLN
INTRAMUSCULAR | Status: AC
  Filled 2020-03-22: qty 1

## 2020-03-22 MED ORDER — ROCURONIUM BROMIDE 100 MG/10ML IV SOLN
INTRAVENOUS | Status: DC | PRN
  Administered 2020-03-22: 50 mg via INTRAVENOUS

## 2020-03-22 MED ORDER — ACETAMINOPHEN 160 MG/5ML PO SOLN: 1000.0000 mg | Freq: Once | ORAL | Status: DC | PRN

## 2020-03-22 MED ORDER — FENTANYL CITRATE (PF) 100 MCG/2ML IJ SOLN
25.0000 ug | INTRAMUSCULAR | Status: DC | PRN
  Administered 2020-03-22: 25 ug via INTRAVENOUS

## 2020-03-22 MED ORDER — POVIDONE-IODINE 10 % EX SWAB
2.0000 "application " | Freq: Once | CUTANEOUS | Status: AC
  Administered 2020-03-22: 2 via TOPICAL

## 2020-03-22 MED ORDER — OXYCODONE HCL 5 MG PO TABS: 5.0000 mg | ORAL_TABLET | Freq: Four times a day (QID) | ORAL | 0 refills | Status: DC | PRN

## 2020-03-22 MED ORDER — SOD CITRATE-CITRIC ACID 500-334 MG/5ML PO SOLN
30.0000 mL | ORAL | Status: AC
  Administered 2020-03-22: 30 mL via ORAL

## 2020-03-22 SURGICAL SUPPLY — 26 items
APL SRG 38 LTWT LNG FL B (MISCELLANEOUS) ×1
APPLICATOR ARISTA FLEXITIP XL (MISCELLANEOUS) ×1 IMPLANT
CLIP FILSHIE TUBAL LIGA STRL (Clip) ×2 IMPLANT
DURAPREP 26ML APPLICATOR (WOUND CARE) ×2 IMPLANT
GAUZE 4X4 16PLY RFD (DISPOSABLE) ×2 IMPLANT
GLOVE BIO SURGEON STRL SZ7.5 (GLOVE) ×2 IMPLANT
GLOVE BIOGEL PI IND STRL 7.0 (GLOVE) ×2 IMPLANT
GLOVE BIOGEL PI IND STRL 8 (GLOVE) ×1 IMPLANT
GLOVE BIOGEL PI INDICATOR 7.0 (GLOVE) ×2
GLOVE BIOGEL PI INDICATOR 8 (GLOVE) ×1
GOWN STRL REUS W/TWL LRG LVL3 (GOWN DISPOSABLE) ×2 IMPLANT
GOWN STRL REUS W/TWL XL LVL3 (GOWN DISPOSABLE) ×2 IMPLANT
HEMOSTAT ARISTA ABSORB 3G PWDR (HEMOSTASIS) ×1 IMPLANT
NS IRRIG 1000ML POUR BTL (IV SOLUTION) ×2 IMPLANT
PACK LAPAROSCOPY BASIN (CUSTOM PROCEDURE TRAY) ×2 IMPLANT
PACK TRENDGUARD 450 HYBRID PRO (MISCELLANEOUS) IMPLANT
PAD OB MATERNITY 4.3X12.25 (PERSONAL CARE ITEMS) ×2 IMPLANT
PAD PREP 24X48 CUFFED NSTRL (MISCELLANEOUS) ×2 IMPLANT
SET TUBE SMOKE EVAC HIGH FLOW (TUBING) ×2 IMPLANT
SLEEVE SCD COMPRESS KNEE MED (MISCELLANEOUS) ×2 IMPLANT
SUT MNCRL AB 4-0 PS2 18 (SUTURE) ×2 IMPLANT
SUT VICRYL 0 UR6 27IN ABS (SUTURE) ×3 IMPLANT
TOWEL GREEN STERILE FF (TOWEL DISPOSABLE) ×4 IMPLANT
TRENDGUARD 450 HYBRID PRO PACK (MISCELLANEOUS) ×2
TROCAR BALLN 12MMX100 BLUNT (TROCAR) ×2 IMPLANT
WARMER LAPAROSCOPE (MISCELLANEOUS) ×2 IMPLANT

## 2020-03-22 NOTE — Interval H&P Note (Signed)
History and Physical Interval Note:  03/22/2020 1:15 PM  Ann Bruce  has presented today for surgery, with the diagnosis of Undesired Fertility.  The various methods of treatment have been discussed with the patient and family. After consideration of risks, benefits and other options for treatment, the patient has consented to  Procedure(s): LAPAROSCOPIC TUBAL LIGATION (Bilateral) as a surgical intervention.  The patient's history has been reviewed, patient examined, no change in status, stable for surgery.  I have reviewed the patient's chart and labs.  Questions were answered to the patient's satisfaction.     [name]

## 2020-03-22 NOTE — Anesthesia Preprocedure Evaluation (Signed)
Anesthesia Evaluation  Patient identified by MRN, date of birth, ID band Patient awake    Reviewed: Allergy & Precautions, NPO status , Patient's Chart, lab work & pertinent test results  Airway Mallampati: II  TM Distance: >3 FB Neck ROM: Full    Dental  (+) Dental Advisory Given, Teeth Intact   Pulmonary neg shortness of breath, neg sleep apnea, neg COPD, neg recent URI, Current Smoker and Patient abstained from smoking.,  Covid-19 Nucleic Acid Test Results Lab Results      Component                Value               Date                      [place]              NEGATIVE            03/20/2020                [place]              NEGATIVE            12/24/2019                [place]              NEGATIVE            08/02/2019              breath sounds clear to auscultation       Cardiovascular hypertension, Pt. on medications (-) angina(-) Past MI and (-) CHF (-) dysrhythmias  Rhythm:Regular     Neuro/Psych negative neurological ROS  negative psych ROS   GI/Hepatic negative GI ROS, Neg liver ROS,   Endo/Other  negative endocrine ROS  Renal/GU negative Renal ROS     Musculoskeletal negative musculoskeletal ROS (+)   Abdominal   Peds  Hematology negative hematology ROS (+)   Anesthesia Other Findings   Reproductive/Obstetrics                             Anesthesia Physical Anesthesia Plan  ASA: II  Anesthesia Plan: General   Post-op Pain Management:    Induction: Intravenous  PONV Risk Score and Plan: 2 and Ondansetron and Dexamethasone  Airway Management Planned: Oral ETT  Additional Equipment: None  Intra-op Plan:   Post-operative Plan: Extubation in OR  Informed Consent: I have reviewed the patients History and Physical, chart, labs and discussed the procedure including the risks, benefits and alternatives for the proposed anesthesia with the patient or  authorized representative who has indicated his/her understanding and acceptance.     Dental advisory given  Plan Discussed with: CRNA and Surgeon  Anesthesia Plan Comments:         Anesthesia Quick Evaluation

## 2020-03-22 NOTE — Discharge Instructions (Signed)
Laparoscopic Tubal Ligation, Care After This sheet gives you information about how to care for yourself after your procedure. Your health care provider may also give you more specific instructions. If you have problems or questions, contact your health care provider. What can I expect after the procedure? After the procedure, it is common to have:  A sore throat.  Discomfort in your shoulder.  Mild discomfort or cramping in your abdomen.  Gas pains.  Pain or soreness in the area where the surgical incision was made.  A bloated feeling.  Tiredness.  Nausea.  Vomiting. Follow these instructions at home: Medicines  Take over-the-counter and prescription medicines only as told by your health care provider.  Do not take aspirin because it can cause bleeding.  Ask your health care provider if the medicine prescribed to you: ? Requires you to avoid driving or using heavy machinery. ? Can cause constipation. You may need to take actions to prevent or treat constipation, such as:  Drink enough fluid to keep your urine pale yellow.  Take over-the-counter or prescription medicines.  Eat foods that are high in fiber, such as beans, whole grains, and fresh fruits and vegetables.  Limit foods that are high in fat and processed sugars, such as fried or sweet foods. Incision care      Follow instructions from your health care provider about how to take care of your incision. Make sure you: ? Wash your hands with soap and water before and after you change your bandage (dressing). If soap and water are not available, use hand sanitizer. ? Change your dressing as told by your health care provider. ? Leave stitches (sutures), skin glue, or adhesive strips in place. These skin closures may need to stay in place for 2 weeks or longer. If adhesive strip edges start to loosen and curl up, you may trim the loose edges. Do not remove adhesive strips completely unless your health care provider  tells you to do that.  Check your incision area every day for signs of infection. Check for: ? Redness, swelling, or pain. ? Fluid or blood. ? Warmth. ? Pus or a bad smell. Activity  Rest as told by your health care provider.  Avoid sitting for a long time without moving. Get up to take short walks every 1-2 hours. This is important to improve blood flow and breathing. Ask for help if you feel weak or unsteady.  Return to your normal activities as told by your health care provider. Ask your health care provider what activities are safe for you. General instructions  Do not take baths, swim, or use a hot tub until your health care provider approves. Ask your health care provider if you may take showers. You may only be allowed to take sponge baths.  Have someone help you with your daily household tasks for the first few days.  Keep all follow-up visits as told by your health care provider. This is important. Contact a health care provider if:  You have redness, swelling, or pain around your incision.  Your incision feels warm to the touch.  You have pus or a bad smell coming from your incision.  The edges of your incision break open after the sutures have been removed.  Your pain does not improve after 2-3 days.  You have a rash.  You repeatedly become dizzy or light-headed.  Your pain medicine is not helping. Get help right away if you:  Have a fever.  Faint.  Have increasing   pain in your abdomen.  Have severe pain in one or both of your shoulders.  Have fluid or blood coming from your sutures or from your vagina.  Have shortness of breath or difficulty breathing.  Have chest pain or leg pain.  Have ongoing nausea, vomiting, or diarrhea. Summary  After the procedure, it is common to have mild discomfort or cramping in your abdomen.  Take over-the-counter and prescription medicines only as told by your health care provider.  Watch for symptoms that should  prompt you to call your health care provider.  Keep all follow-up visits as told by your health care provider. This is important. This information is not intended to replace advice given to you by your health care provider. Make sure you discuss any questions you have with your health care provider. Document Revised: 10/20/2018 Document Reviewed: 04/07/2018 Elsevier Patient Education  [place].  No Tylenol until 6:42 pm No Ibuprofen or Motrin until 6:43 pm   Post Anesthesia Home Care Instructions  Activity: Get plenty of rest for the remainder of the day. A responsible individual must stay with you for 24 hours following the procedure.  For the next 24 hours, DO NOT: -Drive a car -Paediatric nurse -Drink alcoholic beverages -Take any medication unless instructed by your physician -Make any legal decisions or sign important papers.  Meals: Start with liquid foods such as gelatin or soup. Progress to regular foods as tolerated. Avoid greasy, spicy, heavy foods. If nausea and/or vomiting occur, drink only clear liquids until the nausea and/or vomiting subsides. Call your physician if vomiting continues.  Special Instructions/Symptoms: Your throat may feel dry or sore from the anesthesia or the breathing tube placed in your throat during surgery. If this causes discomfort, gargle with warm salt water. The discomfort should disappear within 24 hours.  If you had a scopolamine patch placed behind your ear for the management of post- operative nausea and/or vomiting:  1. The medication in the patch is effective for 72 hours, after which it should be removed.  Wrap patch in a tissue and discard in the trash. Wash hands thoroughly with soap and water. 2. You may remove the patch earlier than 72 hours if you experience unpleasant side effects which may include dry mouth, dizziness or visual disturbances. 3. Avoid touching the patch. Wash your hands with soap and water after contact  with the patch.

## 2020-03-22 NOTE — Transfer of Care (Signed)
Immediate Anesthesia Transfer of Care Note  Patient: Ann Bruce  Procedure(s) Performed: LAPAROSCOPIC TUBAL LIGATION WITH FILSHIE CLIPS (Bilateral )  Patient Location: PACU  Anesthesia Type:General  Level of Consciousness: awake, alert  and oriented  Airway & Oxygen Therapy: Patient Spontanous Breathing and Patient connected to face mask oxygen  Post-op Assessment: Report given to RN and Post -op Vital signs reviewed and stable  Post vital signs: Reviewed and stable  Last Vitals:  Vitals Value Taken Time  BP 155/127 03/22/20 1445  Temp    Pulse 80 03/22/20 1447  Resp 19 03/22/20 1447  SpO2 100 % 03/22/20 1447  Vitals shown include unvalidated device data.  Last Pain:  Vitals:   03/22/20 1222  TempSrc: Oral  PainSc: 0-No pain         Complications: No complications documented.

## 2020-03-22 NOTE — Op Note (Signed)
Ann Bruce 03/22/2020  PREOPERATIVE DIAGNOSIS:  Undesired fertility  POSTOPERATIVE DIAGNOSIS:  Undesired fertility  PROCEDURE:  Laparoscopic Bilateral Tubal Sterilization using Filshie Clips   SURGEON: Arlina Robes, MD  ANESTHESIA:  General endotracheal  COMPLICATIONS:  None immediate.  ESTIMATED BLOOD LOSS:  < 30 cc.  FLUIDS: As recorded  URINE OUTPUT:  As recorded   INDICATIONS: 34 y.o. [id number]  with undesired fertility, desires permanent sterilization. Other reversible forms of contraception were discussed with patient; she declines all other modalities.  Risks of procedure discussed with patient including permanence of method, bleeding, infection, injury to surrounding organs and need for additional procedures including laparotomy, risk of regret.  Failure risk of 0.5-1% with increased risk of ectopic gestation if pregnancy occurs was also discussed with patient.      FINDINGS:  Normal uterus, tubes, and right tube, left ovarian cyst ? hemorraghic  TECHNIQUE:  The patient was taken to the operating room where general anesthesia was obtained without difficulty.  She was then placed in the dorsal lithotomy position and prepared and draped in sterile fashion.  After an adequate timeout was performed, a bivalved speculum was then placed in the patient's vagina, and the anterior lip of cervix grasped with the single-tooth tenaculum.  The uterine manipulator was then advanced into the uterus.  The speculum was removed from the vagina.  Attention was then turned to the patient's abdomen where a 11-mm skin incision was made in the umbilical fold.  The Optiview 11-mm trocar and sleeve were then advanced without difficulty with the laparoscope under direct visualization into the abdomen.  The abdomen was then insufflated with carbon dioxide gas and adequate pneumoperitoneum was obtained.  A survey of the patient's pelvis revealed the above noted findings.  The fallopian tubes were observed  and found to be normal in appearance. The Filshie clip applicator was placed through the operative port, and a Filshie clip was placed on the right fallopian tube ,about 2 cm from the cornual attachment, with care given to incorporate the underlying mesosalpinx.  A similar process was carried out on the contralateral side allowing for bilateral tubal sterilization.     Needle aspiration of the left ovarian cyst demonstrated some clear fluid and blood. The pelvis was irrigated with NS. [name] was placed on the aspiration site. Good overall hemostasis was noted.   The instruments were then removed from the patient's abdomen and the fascial incision was repaired with 0 Vicryl, and the skin was closed with Dermabond.  The uterine manipulator and the tenaculum were removed from the vagina without complications. The patient tolerated the procedure well.  Sponge, lap, and needle counts were correct times two.  The patient was then taken to the recovery room awake, extubated and in stable condition.

## 2020-03-22 NOTE — Anesthesia Procedure Notes (Signed)
Procedure Name: Intubation Performed by: Tanazia Achee M, CRNA Pre-anesthesia Checklist: Patient identified, Emergency Drugs available, Suction available and Patient being monitored Patient Re-evaluated:Patient Re-evaluated prior to induction Oxygen Delivery Method: Circle system utilized Preoxygenation: Pre-oxygenation with 100% oxygen Induction Type: IV induction Ventilation: Mask ventilation without difficulty Laryngoscope Size: Mac and 3 Grade View: Grade I Tube type: Oral Tube size: 7.0 mm Number of attempts: 1 Airway Equipment and Method: Stylet and Oral airway Placement Confirmation: ETT inserted through vocal cords under direct vision,  positive ETCO2,  breath sounds checked- equal and bilateral and CO2 detector Secured at: 23 cm Tube secured with: Tape Dental Injury: Teeth and Oropharynx as per pre-operative assessment        

## 2020-03-22 NOTE — Anesthesia Postprocedure Evaluation (Signed)
Anesthesia Post Note  Patient: Ann Bruce  Procedure(s) Performed: LAPAROSCOPIC TUBAL LIGATION WITH FILSHIE CLIPS (Bilateral )     Patient location during evaluation: PACU Anesthesia Type: General Level of consciousness: awake and alert Pain management: pain level controlled Vital Signs Assessment: post-procedure vital signs reviewed and stable Respiratory status: spontaneous breathing, nonlabored ventilation, respiratory function stable and patient connected to nasal cannula oxygen Cardiovascular status: blood pressure returned to baseline and stable Postop Assessment: no apparent nausea or vomiting Anesthetic complications: no   No complications documented.  Last Vitals:  Vitals:   03/22/20 1445 03/22/20 1500  BP: 132/90   Pulse: 94 77  Resp: 19 17  Temp: 36.6 C   SpO2: 100% 99%    Last Pain:  Vitals:   03/22/20 1445  TempSrc:   PainSc: Asleep                 Charelle Petrakis

## 2020-03-23 ENCOUNTER — Encounter (HOSPITAL_BASED_OUTPATIENT_CLINIC_OR_DEPARTMENT_OTHER): Payer: Self-pay | Admitting: Obstetrics and Gynecology

## 2020-04-03 ENCOUNTER — Telehealth: Payer: Self-pay | Admitting: Obstetrics and Gynecology

## 2020-04-03 NOTE — Telephone Encounter (Signed)
Patient has been having a lot of pain since her surgery, feel like the tube is not all the way in, and its causing a lot of pain.

## 2020-04-03 NOTE — Telephone Encounter (Signed)
Called pt and pt informs me that she has been having a lot of tenderness in her incision site that is in her belly button.  I advised pt that it is normal to have tenderness at the site as she had surgery on 03/22/20.  I encouraged pt to monitor for sx's of infection, to continue to take the ibuprofen as it will help with the inflammation, and to continue to allow warm, soapy water to run over the incision and pat dry.  I advised pt that if anything changes to please call the office and that she will be evaluated at her appt on 04/24/20.  Pt verbalized understanding.   Mel Almond, RN  04/03/20

## 2020-04-14 ENCOUNTER — Other Ambulatory Visit: Payer: Self-pay | Admitting: Family Medicine

## 2020-04-24 ENCOUNTER — Ambulatory Visit (INDEPENDENT_AMBULATORY_CARE_PROVIDER_SITE_OTHER): Payer: Medicaid Other | Admitting: Obstetrics and Gynecology

## 2020-04-24 ENCOUNTER — Other Ambulatory Visit: Payer: Self-pay

## 2020-04-24 ENCOUNTER — Encounter: Payer: Self-pay | Admitting: Obstetrics and Gynecology

## 2020-04-24 DIAGNOSIS — Z9889 Other specified postprocedural states: Secondary | ICD-10-CM | POA: Insufficient documentation

## 2020-04-24 DIAGNOSIS — Z9851 Tubal ligation status: Secondary | ICD-10-CM

## 2020-04-24 MED ORDER — NIFEDIPINE ER OSMOTIC RELEASE 30 MG PO TB24: 30.0000 mg | ORAL_TABLET | Freq: Every day | ORAL | 0 refills | Status: DC

## 2020-04-24 NOTE — Patient Instructions (Signed)
Health Maintenance, Female Adopting a healthy lifestyle and getting preventive care are important in promoting health and wellness. Ask your health care provider about:  The right schedule for you to have regular tests and exams.  Things you can do on your own to prevent diseases and keep yourself healthy. What should I know about diet, weight, and exercise? Eat a healthy diet   Eat a diet that includes plenty of vegetables, fruits, low-fat dairy products, and lean protein.  Do not eat a lot of foods that are high in solid fats, added sugars, or sodium. Maintain a healthy weight Body mass index (BMI) is used to identify weight problems. It estimates body fat based on height and weight. Your health care provider can help determine your BMI and help you achieve or maintain a healthy weight. Get regular exercise Get regular exercise. This is one of the most important things you can do for your health. Most adults should:  Exercise for at least 150 minutes each week. The exercise should increase your heart rate and make you sweat (moderate-intensity exercise).  Do strengthening exercises at least twice a week. This is in addition to the moderate-intensity exercise.  Spend less time sitting. Even light physical activity can be beneficial. Watch cholesterol and blood lipids Have your blood tested for lipids and cholesterol at 35 years of age, then have this test every 5 years. Have your cholesterol levels checked more often if:  Your lipid or cholesterol levels are high.  You are older than 35 years of age.  You are at high risk for heart disease. What should I know about cancer screening? Depending on your health history and family history, you may need to have cancer screening at various ages. This may include screening for:  Breast cancer.  Cervical cancer.  Colorectal cancer.  Skin cancer.  Lung cancer. What should I know about heart disease, diabetes, and high blood  pressure? Blood pressure and heart disease  High blood pressure causes heart disease and increases the risk of stroke. This is more likely to develop in people who have high blood pressure readings, are of African descent, or are overweight.  Have your blood pressure checked: ? Every 3-5 years if you are 18-39 years of age. ? Every year if you are 40 years old or older. Diabetes Have regular diabetes screenings. This checks your fasting blood sugar level. Have the screening done:  Once every three years after age 40 if you are at a normal weight and have a low risk for diabetes.  More often and at a younger age if you are overweight or have a high risk for diabetes. What should I know about preventing infection? Hepatitis B If you have a higher risk for hepatitis B, you should be screened for this virus. Talk with your health care provider to find out if you are at risk for hepatitis B infection. Hepatitis C Testing is recommended for:  Everyone born from 1945 through 1965.  Anyone with known risk factors for hepatitis C. Sexually transmitted infections (STIs)  Get screened for STIs, including gonorrhea and chlamydia, if: ? You are sexually active and are younger than 35 years of age. ? You are older than 35 years of age and your health care provider tells you that you are at risk for this type of infection. ? Your sexual activity has changed since you were last screened, and you are at increased risk for chlamydia or gonorrhea. Ask your health care provider if   you are at risk.  Ask your health care provider about whether you are at high risk for HIV. Your health care provider may recommend a prescription medicine to help prevent HIV infection. If you choose to take medicine to prevent HIV, you should first get tested for HIV. You should then be tested every 3 months for as long as you are taking the medicine. Pregnancy  If you are about to stop having your period (premenopausal) and  you may become pregnant, seek counseling before you get pregnant.  Take 400 to 800 micrograms (mcg) of folic acid every day if you become pregnant.  Ask for birth control (contraception) if you want to prevent pregnancy. Osteoporosis and menopause Osteoporosis is a disease in which the bones lose minerals and strength with aging. This can result in bone fractures. If you are 65 years old or older, or if you are at risk for osteoporosis and fractures, ask your health care provider if you should:  Be screened for bone loss.  Take a calcium or vitamin D supplement to lower your risk of fractures.  Be given hormone replacement therapy (HRT) to treat symptoms of menopause. Follow these instructions at home: Lifestyle  Do not use any products that contain nicotine or tobacco, such as cigarettes, e-cigarettes, and chewing tobacco. If you need help quitting, ask your health care provider.  Do not use street drugs.  Do not share needles.  Ask your health care provider for help if you need support or information about quitting drugs. Alcohol use  Do not drink alcohol if: ? Your health care provider tells you not to drink. ? You are pregnant, may be pregnant, or are planning to become pregnant.  If you drink alcohol: ? Limit how much you use to 0-1 drink a day. ? Limit intake if you are breastfeeding.  Be aware of how much alcohol is in your drink. In the [place] one drink equals one 12 oz bottle of beer (355 mL), one 5 oz glass of wine (148 mL), or one 1 oz glass of hard liquor (44 mL). General instructions  Schedule regular health, dental, and eye exams.  Stay current with your vaccines.  Tell your health care provider if: ? You often feel depressed. ? You have ever been abused or do not feel safe at home. Summary  Adopting a healthy lifestyle and getting preventive care are important in promoting health and wellness.  Follow your health care provider's instructions about healthy  diet, exercising, and getting tested or screened for diseases.  Follow your health care provider's instructions on monitoring your cholesterol and blood pressure. This information is not intended to replace advice given to you by your health care provider. Make sure you discuss any questions you have with your health care provider. Document Revised: 05/06/2018 Document Reviewed: 05/06/2018 Elsevier Patient Education  2020 Elsevier Inc.  

## 2020-04-24 NOTE — Progress Notes (Signed)
Ann Bruce is here for post op visit from [place] via [facility] on 03/22/20 She has no complaints today Denies any bowel or bladder dysfunction She does request Rx for BP medication. She has an appt with her PCP on 04/28/20  PE AF VSS Lungs clear Heart RRR Abd soft + BS incision well healed  A/P Post Op BTL        HTN  Return to nl ADL's. Rx for Procardia Xl 30 mg, 1 month supply. F/U PRN

## 2020-04-27 ENCOUNTER — Ambulatory Visit: Payer: Medicaid Other | Admitting: Family Medicine

## 2020-05-02 ENCOUNTER — Encounter: Payer: Self-pay | Admitting: General Practice

## 2020-06-28 ENCOUNTER — Encounter (INDEPENDENT_AMBULATORY_CARE_PROVIDER_SITE_OTHER): Payer: Self-pay | Admitting: Primary Care

## 2020-06-28 ENCOUNTER — Ambulatory Visit (INDEPENDENT_AMBULATORY_CARE_PROVIDER_SITE_OTHER): Payer: 59 | Admitting: Primary Care

## 2020-06-28 ENCOUNTER — Other Ambulatory Visit: Payer: Self-pay

## 2020-06-28 VITALS — BP 146/100 | HR 74 | Temp 97.9°F | Ht 69.0 in | Wt 182.8 lb

## 2020-06-28 DIAGNOSIS — Z7689 Persons encountering health services in other specified circumstances: Secondary | ICD-10-CM | POA: Diagnosis not present

## 2020-06-28 DIAGNOSIS — Z23 Encounter for immunization: Secondary | ICD-10-CM | POA: Diagnosis not present

## 2020-06-28 DIAGNOSIS — F53 Postpartum depression: Secondary | ICD-10-CM

## 2020-06-28 DIAGNOSIS — I10 Essential (primary) hypertension: Secondary | ICD-10-CM

## 2020-06-28 DIAGNOSIS — O99345 Other mental disorders complicating the puerperium: Secondary | ICD-10-CM

## 2020-06-28 MED ORDER — HYDROCHLOROTHIAZIDE 12.5 MG PO TABS
25.0000 mg | ORAL_TABLET | Freq: Every day | ORAL | 1 refills | Status: DC
Start: 2020-06-28 — End: 2020-07-01

## 2020-06-28 MED ORDER — NIFEDIPINE ER OSMOTIC RELEASE 30 MG PO TB24
30.0000 mg | ORAL_TABLET | Freq: Every day | ORAL | 1 refills | Status: DC
Start: 2020-06-28 — End: 2020-10-09

## 2020-06-28 NOTE — Patient Instructions (Signed)
Influenza, Adult Influenza is also called "the flu." It is an infection in the lungs, nose, and throat (respiratory tract). It spreads easily from person to person (is contagious). The flu causes symptoms that are like a cold, along with high fever and body aches. What are the causes? This condition is caused by the influenza virus. You can get the virus by:  Breathing in droplets that are in the air after a person infected with the flu coughed or sneezed.  Touching something that has the virus on it and then touching your mouth, nose, or eyes. What increases the risk? Certain things may make you more likely to get the flu. These include:  Not washing your hands often.  Having close contact with many people during cold and flu season.  Touching your mouth, eyes, or nose without first washing your hands.  Not getting a flu shot every year. You may have a higher risk for the flu, and serious problems, such as a lung infection (pneumonia), if you:  Are older than 65.  Are pregnant.  Have a weakened disease-fighting system (immune system) because of a disease or because you are taking certain medicines.  Have a long-term (chronic) condition, such as: ? Heart, kidney, or lung disease. ? Diabetes. ? Asthma.  Have a liver disorder.  Are very overweight (morbidly obese).  Have anemia. What are the signs or symptoms? Symptoms usually begin suddenly and last 4-14 days. They may include:  Fever and chills.  Headaches, body aches, or muscle aches.  Sore throat.  Cough.  Runny or stuffy (congested) nose.  Feeling discomfort in your chest.  Not wanting to eat as much as normal.  Feeling weak or tired.  Feeling dizzy.  Feeling sick to your stomach or throwing up. How is this treated? If the flu is found early, you can be treated with antiviral medicine. This can help to reduce how bad the illness is and how long it lasts. This may be given by mouth or through an IV  tube. Taking care of yourself at home can help your symptoms get better. Your doctor may want you to:  Take over-the-counter medicines.  Drink plenty of fluids. The flu often goes away on its own. If you have very bad symptoms or other problems, you may be treated in a hospital. Follow these instructions at home: Activity  Rest as needed. Get plenty of sleep.  Stay home from work or school as told by your doctor. ? Do not leave home until you do not have a fever for 24 hours without taking medicine. ? Leave home only to go to your doctor. Eating and drinking  Take an ORS (oral rehydration solution). This is a drink that is sold at pharmacies and stores.  Drink enough fluid to keep your pee pale yellow.  Drink clear fluids in small amounts as you are able. Clear fluids include: ? Water. ? Ice chips. ? Fruit juice mixed with water. ? Low-calorie sports drinks.  Eat bland foods that are easy to digest. Eat small amounts as you are able. These foods include: ? Bananas. ? Applesauce. ? Rice. ? Lean meats. ? Toast. ? Crackers.  Do not eat or drink: ? Fluids that have a lot of sugar or caffeine. ? Alcohol. ? Spicy or fatty foods. General instructions  Take over-the-counter and prescription medicines only as told by your doctor.  Use a cool mist humidifier to add moisture to the air in your home. This can make it   easier for you to breathe. ? When using a cool mist humidifier, clean it daily. Empty water and replace with clean water.  Cover your mouth and nose when you cough or sneeze.  Wash your hands with soap and water often and for at least 20 seconds. This is also important after you cough or sneeze. If you cannot use soap and water, use alcohol-based hand sanitizer.  Keep all follow-up visits.      How is this prevented?  Get a flu shot every year. You may get the flu shot in late summer, fall, or winter. Ask your doctor when you should get your flu shot.  Avoid  contact with people who are sick during fall and winter. This is cold and flu season.   Contact a doctor if:  You get new symptoms.  You have: ? Chest pain. ? Watery poop (diarrhea). ? A fever.  Your cough gets worse.  You start to have more mucus.  You feel sick to your stomach.  You throw up. Get help right away if you:  Have shortness of breath.  Have trouble breathing.  Have skin or nails that turn a bluish color.  Have very bad pain or stiffness in your neck.  Get a sudden headache.  Get sudden pain in your face or ear.  Cannot eat or drink without throwing up. These symptoms may represent a serious problem that is an emergency. Get medical help right away. Call your local emergency services (911 in the [place]  Do not wait to see if the symptoms will go away.  Do not drive yourself to the hospital. Summary  Influenza is also called "the flu." It is an infection in the lungs, nose, and throat. It spreads easily from person to person.  Take over-the-counter and prescription medicines only as told by your doctor.  Getting a flu shot every year is the best way to not get the flu. This information is not intended to replace advice given to you by your health care provider. Make sure you discuss any questions you have with your health care provider. Document Revised: 12/31/2019 Document Reviewed: 12/31/2019 Elsevier Patient Education  2021 Elsevier Inc.  

## 2020-06-28 NOTE — Progress Notes (Signed)
New Patient Office Visit  Subjective:  Patient ID: Ann Bruce, female    DOB: 11-02-1985  Age: 35 y.o. MRN: [id number]  CC:  Chief Complaint  Patient presents with  . New Patient (Initial Visit)    HTN    HPI Ann Bruce is a 35 year old female who presents for establishment of care and the management of hypertension. Denies shortness of breath, headaches, chest pain or lower extremity edema.  Past Medical History:  Diagnosis Date  . Dyspnea    with pregnancy - exertion  . Hypertension   . Ovarian cyst   . UTI (urinary tract infection)     Past Surgical History:  Procedure Laterality Date  . APPENDECTOMY    . LAPAROSCOPIC TUBAL LIGATION Bilateral 03/22/2020   Procedure: LAPAROSCOPIC TUBAL LIGATION WITH FILSHIE CLIPS;  Surgeon: Ann Milroy, MD;  Location: [facility];  Service: Gynecology;  Laterality: Bilateral;  . LAPAROTOMY Bilateral 08/04/2019   Procedure: EXPLORATORY LAPAROTOMY WITH BILATERAL OVARIAN CYSTECTOMY;  Surgeon: Osborne Oman, MD;  Location: [facility];  Service: Gynecology;  Laterality: Bilateral;  Dr. Harolyn Rutherford will check fetal heart tones before and after surgery  . OVARIAN CYST SURGERY  2009  . VAGINAL DELIVERY  2009, 2007   x 2    Family History  Problem Relation Age of Onset  . Hypertension Mother   . Hypertension Father     Social History   Socioeconomic History  . Marital status: Single    Spouse name: Not on file  . Number of children: 2  . Years of education: Not on file  . Highest education level: Some college, no degree  Occupational History  . Not on file  Tobacco Use  . Smoking status: Current Every Day Smoker    Packs/day: 0.25    Years: 15.00    Pack years: 3.75    Types: Cigarettes  . Smokeless tobacco: Never Used  . Tobacco comment: smokes every 3 days  or when stresses, trying to quit  Vaping Use  . Vaping Use: Never used  Substance and Sexual Activity  . Alcohol use: Yes    Comment: occasional  wine   . Drug use: Never  . Sexual activity: Yes    Birth control/protection: None    Comment: approx [redacted] wks gestation  Other Topics Concern  . Not on file  Social History Narrative  . Not on file   Social Determinants of Health   Financial Resource Strain: Not on file  Food Insecurity: No Food Insecurity  . Worried About Charity fundraiser in the Last Year: Never true  . Ran Out of Food in the Last Year: Never true  Transportation Needs: No Transportation Needs  . Lack of Transportation (Medical): No  . Lack of Transportation (Non-Medical): No  Physical Activity: Not on file  Stress: Not on file  Social Connections: Not on file  Intimate Partner Violence: Not on file    ROS Review of Systems  Neurological: Positive for headaches.  Psychiatric/Behavioral: The patient is nervous/anxious.        Depression  All other systems reviewed and are negative.   Objective:   Today's Vitals: BP (!) 146/100 (BP Location: Right Arm, Patient Position: Sitting, Cuff Size: Normal)   Pulse 74   Temp 97.9 F (36.6 C) (Temporal)   Ht 5\' 9"  (1.753 m)   Wt 182 lb 12.8 oz (82.9 kg)   LMP 06/14/2020 (Exact Date)   SpO2 100%  Breastfeeding No   BMI 26.99 kg/m   Physical Exam Vitals reviewed.  Constitutional:      Appearance: Normal appearance.  HENT:     Right Ear: Tympanic membrane and external ear normal.     Left Ear: Tympanic membrane and external ear normal.     Nose: Nose normal.  Eyes:     Extraocular Movements: Extraocular movements intact.     Pupils: Pupils are equal, round, and reactive to light.  Cardiovascular:     Rate and Rhythm: Normal rate and regular rhythm.  Pulmonary:     Effort: Pulmonary effort is normal.     Breath sounds: Normal breath sounds.  Abdominal:     General: Bowel sounds are normal.     Palpations: Abdomen is soft.  Musculoskeletal:        General: Normal range of motion.     Cervical back: Normal range of motion.  Skin:    General:  Skin is warm and dry.  Neurological:     Mental Status: She is alert and oriented to person, place, and time.  Psychiatric:        Mood and Affect: Mood normal.        Behavior: Behavior normal.        Thought Content: Thought content normal.        Judgment: Judgment normal.    Outpatient Encounter Medications as of 06/28/2020  Medication Sig  . hydrochlorothiazide (HYDRODIURIL) 12.5 MG tablet Take 2 tablets (25 mg total) by mouth daily.  Marland Kitchen NIFEdipine (PROCARDIA-XL/NIFEDICAL-XL) 30 MG 24 hr tablet Take 1 tablet (30 mg total) by mouth daily.  . Prenatal Vit-Fe Fumarate-FA (PRENATAL VITAMINS) 28-0.8 MG TABS Take 1 tablet by mouth daily. (Patient not taking: Reported on 06/28/2020)  . [DISCONTINUED] ibuprofen (ADVIL) 800 MG tablet Take 1 tablet (800 mg total) by mouth every 8 (eight) hours as needed.  . [DISCONTINUED] NIFEdipine (ADALAT CC) 30 MG 24 hr tablet Take 1 tablet (30 mg total) by mouth daily.  . [DISCONTINUED] NIFEdipine (PROCARDIA-XL/NIFEDICAL-XL) 30 MG 24 hr tablet Take 1 tablet (30 mg total) by mouth daily. Can increase to twice a day as needed for symptomatic contractions  . [DISCONTINUED] oxyCODONE (OXY IR/ROXICODONE) 5 MG immediate release tablet Take 1 tablet (5 mg total) by mouth every 6 (six) hours as needed for severe pain. (Patient not taking: Reported on 04/24/2020)   No facility-administered encounter medications on file as of 06/28/2020.  Lamar was seen today for new patient (initial visit).  Diagnoses and all orders for this visit:  Encounter to establish care Establishing care with new PCP  Essential hypertension Counseled on blood pressure goal of less than 130/80, low-sodium, DASH diet, medication compliance, 150 minutes of moderate intensity exercise per week. Discussed medication compliance, adverse effects. 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 F/U with CSW   Need for immunization against influenza -     Flu Vaccine QUAD 36+ mos IM  Other orders -     NIFEdipine (PROCARDIA-XL/NIFEDICAL-XL) 30 MG 24 hr tablet; Take 1 tablet (30 mg total) by mouth daily. -     Discontinue: hydrochlorothiazide (HYDRODIURIL) 12.5 MG tablet; Take 2 tablets (25 mg total) by mouth daily.  Follow-up: Return in about 6 weeks (around 08/09/2020) for CSW first avaiable -than f/u PCP 6 wks BP f/u.   Kerin Perna, NP

## 2020-07-01 MED ORDER — HYDROCHLOROTHIAZIDE 25 MG PO TABS: 25.0000 mg | ORAL_TABLET | Freq: Every day | ORAL | 1 refills | Status: DC

## 2020-07-04 ENCOUNTER — Institutional Professional Consult (permissible substitution) (INDEPENDENT_AMBULATORY_CARE_PROVIDER_SITE_OTHER): Payer: Medicaid Other | Admitting: Licensed Clinical Social Worker

## 2020-07-04 ENCOUNTER — Telehealth (INDEPENDENT_AMBULATORY_CARE_PROVIDER_SITE_OTHER): Payer: Self-pay | Admitting: Licensed Clinical Social Worker

## 2020-07-04 NOTE — Telephone Encounter (Signed)
Call placed to patient regarding scheduled [facility] appointment. LCSW left message requesting a return call.  

## 2020-07-12 ENCOUNTER — Telehealth (INDEPENDENT_AMBULATORY_CARE_PROVIDER_SITE_OTHER): Payer: Self-pay | Admitting: Primary Care

## 2020-07-12 NOTE — Telephone Encounter (Signed)
Please advise 

## 2020-07-12 NOTE — Telephone Encounter (Signed)
Patient came in today thinking she had an appointment but I advised her her appointment was not until next month. Patient then asked if there was anyway Sharyn Lull could send in a different prescription for her because the fluid pill was too expensive. Patient is currently using [facility] pharmacy on [address]. Patient then asked if there was any way that she could take one pill that would work as a fluid pill and BP pill to avoid having to pay for two medications. I advised her I would send a message to the nurse. Please follow up.

## 2020-08-09 ENCOUNTER — Ambulatory Visit (INDEPENDENT_AMBULATORY_CARE_PROVIDER_SITE_OTHER): Payer: Medicaid Other | Admitting: Primary Care

## 2020-08-28 ENCOUNTER — Other Ambulatory Visit (HOSPITAL_COMMUNITY)
Admission: RE | Admit: 2020-08-28 | Discharge: 2020-08-28 | Disposition: A | Discharge status: Home / Self Care | Payer: 59 | Source: Ambulatory Visit | Attending: Primary Care | Admitting: Primary Care

## 2020-08-28 ENCOUNTER — Other Ambulatory Visit: Payer: Self-pay

## 2020-08-28 ENCOUNTER — Ambulatory Visit (INDEPENDENT_AMBULATORY_CARE_PROVIDER_SITE_OTHER): Payer: 59 | Admitting: Primary Care

## 2020-08-28 ENCOUNTER — Encounter (INDEPENDENT_AMBULATORY_CARE_PROVIDER_SITE_OTHER): Payer: Self-pay | Admitting: Primary Care

## 2020-08-28 VITALS — BP 142/98 | HR 92 | Temp 97.7°F | Ht 68.0 in | Wt 175.6 lb

## 2020-08-28 DIAGNOSIS — N898 Other specified noninflammatory disorders of vagina: Secondary | ICD-10-CM

## 2020-08-28 DIAGNOSIS — Z716 Tobacco abuse counseling: Secondary | ICD-10-CM | POA: Diagnosis not present

## 2020-08-28 DIAGNOSIS — I10 Essential (primary) hypertension: Secondary | ICD-10-CM | POA: Diagnosis not present

## 2020-08-28 MED ORDER — NICOTINE 21 MG/24HR TD PT24: 21.0000 mg | MEDICATED_PATCH | Freq: Every day | TRANSDERMAL | 0 refills | Status: DC

## 2020-08-28 MED ORDER — NICOTINE 14 MG/24HR TD PT24: 14.0000 mg | MEDICATED_PATCH | Freq: Every day | TRANSDERMAL | 0 refills | Status: DC

## 2020-08-28 MED ORDER — NICOTINE 7 MG/24HR TD PT24: 7.0000 mg | MEDICATED_PATCH | Freq: Every day | TRANSDERMAL | 0 refills | Status: DC

## 2020-08-28 NOTE — Progress Notes (Signed)
[facility]    [name] is a 35 year old obese female who presents for  hypertension evaluation, on previous visit medication was adjusted to include adding HCTZ 25mg  daily with Procardia 30mg  XL. The last several days she admits forgetting to take her Bp medication knowing she had a Bp check. She is concern about small vaginal discharge after her menstrual cycles.   Patient reports adherence with medications.  Current Medication List Current Outpatient Medications on File Prior to Visit  Medication Sig Dispense Refill  . hydrochlorothiazide (HYDRODIURIL) 25 MG tablet Take 1 tablet (25 mg total) by mouth daily. Take 90 tablet 1  . NIFEdipine (PROCARDIA-XL/NIFEDICAL-XL) 30 MG 24 hr tablet Take 1 tablet (30 mg total) by mouth daily. 90 tablet 1  . Prenatal Vit-Fe Fumarate-FA (PRENATAL VITAMINS) 28-0.8 MG TABS Take 1 tablet by mouth daily. (Patient not taking: No sig reported) 30 tablet 12   No current facility-administered medications on file prior to visit.   Past Medical History  Past Medical History:  Diagnosis Date  . Dyspnea    with pregnancy - exertion  . Hypertension   . Ovarian cyst   . UTI (urinary tract infection)    Dietary habits include: watching food intake closer  Exercise habits include:walks Family / Social history: none   ASCVD risk factors include- [place]  O:  Physical Exam Vitals reviewed.  Constitutional:      Appearance: Normal appearance.  HENT:     Head: Normocephalic.     Right Ear: External ear normal.     Left Ear: External ear normal.     Nose: Nose normal.  Eyes:     Extraocular Movements: Extraocular movements intact.     Pupils: Pupils are equal, round, and reactive to light.  Cardiovascular:     Rate and Rhythm: Normal rate and regular rhythm.  Pulmonary:     Effort: Pulmonary effort is normal.     Breath sounds: Normal breath sounds.  Abdominal:     General: Bowel sounds are normal. There is distension.      Palpations: Abdomen is soft.  Musculoskeletal:        General: Normal range of motion.     Cervical back: Normal range of motion.  Skin:    General: Skin is warm and dry.  Neurological:     Mental Status: She is alert and oriented to person, place, and time.  Psychiatric:        Mood and Affect: Mood normal.        Behavior: Behavior normal.        Thought Content: Thought content normal.        Judgment: Judgment normal.      Review of Systems  All other systems reviewed and are negative.   Last 3 Office BP readings: BP Readings from Last 3 Encounters:  06/28/20 (!) 146/100  04/24/20 (!) 139/106  03/22/20 (!) 155/98    BMET    Component Value Date/Time   NA 135 12/24/2019 1224   NA 138 12/16/2019 1450   K 3.4 (L) 12/24/2019 1224   CL 105 12/24/2019 1224   CO2 19 (L) 12/24/2019 1224   GLUCOSE 147 (H) 12/24/2019 1224   BUN 6 12/24/2019 1224   BUN 5 (L) 12/16/2019 1450   CREATININE 0.57 12/24/2019 1224   CALCIUM 8.6 (L) 12/24/2019 1224   GFRNONAA >60 12/24/2019 1224   GFRAA >60 12/24/2019 1224    Renal function: CrCl cannot be calculated (Patient's  most recent lab result is older than the maximum 21 days allowed.).  Clinical ASCVD: No  The ASCVD Risk score [name], et al., 2013) failed to calculate for the following reasons:   The 2013 ASCVD risk score is only valid for ages 10 to 54   A/P: [name] was seen today for blood pressure check.  Diagnoses and all orders for this visit:  Essential hypertension Bp remains uncontrol not at gaol 130/80 We have discussed target BP range and blood pressure goal. I have advised patient to check BP regularly and to call us back or report to clinic if the numbers are consistently higher than 140/90. We discussed the importance of compliance with medical therapy and DASH diet recommended, consequences of uncontrolled hypertension discussed. Discussed tobacco cessation also contributes to HTN. Hypertension longstanding  HCTZ 25mg  and Procardia 30 XLmg on current medications. Patient is mostly adherent with current medications.  -Continued  HCTZ 25mg  and Procardia 30 XLmg -F/u labs ordered - lipids, cmp, cbc  -Counseled on lifestyle modifications for blood pressure control including reduced dietary sodium, increased exercise, adequate sleep - continue current BP medications  Vaginal discharge -     Cervicovaginal ancillary only  Tobacco abuse counseling Patient has agreed to smoking cessation and will start medication to help cessation. Nicotine affect every organ in the body second leading cause of death.  Increased risk for lung cancer and other respiratory diseases recommend cessation.   -     nicotine (NICODERM CQ) 21 mg/24hr patch; Place 1 patch (21 mg total) onto the skin daily. -     nicotine (NICODERM CQ) 14 mg/24hr patch; Place 1 patch (14 mg total) onto the skin daily. -     nicotine (NICODERM CQ) 7 mg/24hr patch; Place 1 patch (7 mg total) onto the skin daily.   Kerin Perna

## 2020-08-28 NOTE — Patient Instructions (Signed)

## 2020-08-30 ENCOUNTER — Other Ambulatory Visit (INDEPENDENT_AMBULATORY_CARE_PROVIDER_SITE_OTHER): Payer: Self-pay | Admitting: Primary Care

## 2020-08-30 DIAGNOSIS — B9689 Other specified bacterial agents as the cause of diseases classified elsewhere: Secondary | ICD-10-CM

## 2020-08-30 LAB — CERVICOVAGINAL ANCILLARY ONLY
Bacterial Vaginitis (gardnerella): POSITIVE — AB
Candida Glabrata: NEGATIVE
Candida Vaginitis: NEGATIVE
Chlamydia: NEGATIVE
Comment: NEGATIVE
Comment: NEGATIVE
Comment: NEGATIVE
Comment: NEGATIVE
Comment: NEGATIVE
Comment: NORMAL
Neisseria Gonorrhea: NEGATIVE
Trichomonas: NEGATIVE

## 2020-08-30 MED ORDER — METRONIDAZOLE 500 MG PO TABS: 500.0000 mg | ORAL_TABLET | Freq: Two times a day (BID) | ORAL | 0 refills | Status: DC

## 2020-10-09 ENCOUNTER — Other Ambulatory Visit: Payer: Self-pay

## 2020-10-09 ENCOUNTER — Encounter (INDEPENDENT_AMBULATORY_CARE_PROVIDER_SITE_OTHER): Payer: Self-pay | Admitting: Primary Care

## 2020-10-09 ENCOUNTER — Ambulatory Visit (INDEPENDENT_AMBULATORY_CARE_PROVIDER_SITE_OTHER): Payer: 59 | Admitting: Primary Care

## 2020-10-09 VITALS — BP 132/89 | HR 80 | Temp 97.9°F | Ht 68.0 in | Wt 174.4 lb

## 2020-10-09 DIAGNOSIS — I10 Essential (primary) hypertension: Secondary | ICD-10-CM | POA: Diagnosis not present

## 2020-10-09 MED ORDER — AMLODIPINE BESYLATE 10 MG PO TABS: 10.0000 mg | ORAL_TABLET | Freq: Every day | ORAL | 1 refills | Status: DC

## 2020-10-09 MED ORDER — LOSARTAN POTASSIUM-HCTZ 100-25 MG PO TABS: 1.0000 | ORAL_TABLET | Freq: Every day | ORAL | 1 refills | Status: DC

## 2020-10-09 NOTE — Progress Notes (Signed)
[name]   [name] is a 35 y.o. female presents for hypertension evaluation, Denies shortness of breath, headaches, chest pain or lower extremity edema, sudden onset, vision changes, unilateral weakness, dizziness, paresthesias   Patient reports adherence with medications.  Dietary habits include: monitoring sodium intake  Exercise habits include:no  Family / Social history: HTN/CVA   Past Medical History:  Diagnosis Date  . Dyspnea    with pregnancy - exertion  . Hypertension   . Ovarian cyst   . UTI (urinary tract infection)    Past Surgical History:  Procedure Laterality Date  . APPENDECTOMY    . LAPAROSCOPIC TUBAL LIGATION Bilateral 03/22/2020   Procedure: LAPAROSCOPIC TUBAL LIGATION WITH FILSHIE CLIPS;  Surgeon: [name], MD;  Location: [facility];  Service: Gynecology;  Laterality: Bilateral;  . LAPAROTOMY Bilateral 08/04/2019   Procedure: EXPLORATORY LAPAROTOMY WITH BILATERAL OVARIAN CYSTECTOMY;  Surgeon: Osborne Oman, MD;  Location: [facility];  Service: Gynecology;  Laterality: Bilateral;  Dr. Harolyn Rutherford will check fetal heart tones before and after surgery  . OVARIAN CYST SURGERY  2009  . VAGINAL DELIVERY  2009, 2007   x 2   No Known Allergies Current Outpatient Medications on File Prior to Visit  Medication Sig Dispense Refill  . hydrochlorothiazide (HYDRODIURIL) 25 MG tablet Take 1 tablet (25 mg total) by mouth daily. Take 90 tablet 1  . nicotine (NICODERM CQ) 14 mg/24hr patch Place 1 patch (14 mg total) onto the skin daily. (Patient not taking: Reported on 10/09/2020) 28 patch 0  . nicotine (NICODERM CQ) 21 mg/24hr patch Place 1 patch (21 mg total) onto the skin daily. (Patient not taking: Reported on 10/09/2020) 28 patch 0  . nicotine (NICODERM CQ) 7 mg/24hr patch Place 1 patch (7 mg total) onto the skin daily. (Patient not taking: Reported on 10/09/2020) 28 patch 0  . Prenatal Vit-Fe Fumarate-FA (PRENATAL VITAMINS)  28-0.8 MG TABS Take 1 tablet by mouth daily. (Patient not taking: No sig reported) 30 tablet 12   No current facility-administered medications on file prior to visit.   Social History   Socioeconomic History  . Marital status: Single    Spouse name: Not on file  . Number of children: 2  . Years of education: Not on file  . Highest education level: Some college, no degree  Occupational History  . Not on file  Tobacco Use  . Smoking status: Current Every Day Smoker    Packs/day: 0.25    Years: 15.00    Pack years: 3.75    Types: Cigarettes  . Smokeless tobacco: Never Used  . Tobacco comment: smokes every 3 days  or when stresses, trying to quit  Vaping Use  . Vaping Use: Never used  Substance and Sexual Activity  . Alcohol use: Yes    Comment: occasional wine   . Drug use: Never  . Sexual activity: Yes    Birth control/protection: None    Comment: approx [redacted] wks gestation  Other Topics Concern  . Not on file  Social History Narrative  . Not on file   Social Determinants of Health   Financial Resource Strain: Not on file  Food Insecurity: No Food Insecurity  . Worried About Charity fundraiser in the Last Year: Never true  . Ran Out of Food in the Last Year: Never true  Transportation Needs: No Transportation Needs  . Lack of Transportation (Medical): No  . Lack of Transportation (Non-Medical): No  Physical  Activity: Not on file  Stress: Not on file  Social Connections: Not on file  Intimate Partner Violence: Not on file   Family History  Problem Relation Age of Onset  . Hypertension Mother   . Hypertension Father      OBJECTIVE:  Vitals:   10/09/20 1618 10/09/20 1634  BP: (!) 133/100 132/89  Pulse: 95 80  Temp: 97.9 F (36.6 C)   TempSrc: Temporal   SpO2: 96%   Weight: 174 lb 6.4 oz (79.1 kg)   Height: 5\' 8"  (1.727 m)     Physical Exam Vitals reviewed.  Constitutional:      Appearance: Normal appearance.  HENT:     Head: Normocephalic.      Right Ear: External ear normal.     Left Ear: External ear normal.     Nose: Nose normal.  Eyes:     Extraocular Movements: Extraocular movements intact.  Cardiovascular:     Rate and Rhythm: Normal rate and regular rhythm.  Pulmonary:     Effort: Pulmonary effort is normal.     Breath sounds: Normal breath sounds.  Abdominal:     General: Bowel sounds are normal.     Palpations: Abdomen is soft.  Musculoskeletal:        General: Normal range of motion.     Cervical back: Normal range of motion and neck supple.  Skin:    General: Skin is warm and dry.  Neurological:     Mental Status: She is alert and oriented to person, place, and time.  Psychiatric:        Mood and Affect: Mood normal.        Behavior: Behavior normal.        Thought Content: Thought content normal.        Judgment: Judgment normal.      Review of Systems  All other systems reviewed and are negative.   Last 3 Office BP readings: BP Readings from Last 3 Encounters:  10/09/20 132/89  08/28/20 (!) 142/98  06/28/20 (!) 146/100    BMET    Component Value Date/Time   NA 135 12/24/2019 1224   NA 138 12/16/2019 1450   K 3.4 (L) 12/24/2019 1224   CL 105 12/24/2019 1224   CO2 19 (L) 12/24/2019 1224   GLUCOSE 147 (H) 12/24/2019 1224   BUN 6 12/24/2019 1224   BUN 5 (L) 12/16/2019 1450   CREATININE 0.57 12/24/2019 1224   CALCIUM 8.6 (L) 12/24/2019 1224   GFRNONAA >60 12/24/2019 1224   GFRAA >60 12/24/2019 1224    Renal function: CrCl cannot be calculated (Patient's most recent lab result is older than the maximum 21 days allowed.).  Clinical ASCVD: No  The ASCVD Risk score [name], et al., 2013) failed to calculate for the following reasons:   The 2013 ASCVD risk score is only valid for ages 49 to 15  ASCVD risk factors include- [place]   ASSESSMENT & PLAN:  No diagnosis found.  No orders of the defined types were placed in this encounter.   -Counseled on lifestyle modifications for  blood pressure control including reduced dietary sodium, increased exercise, weight reduction and adequate sleep. Also, educated patient about the risk for cardiovascular events, stroke and heart attack. Also counseled patient about the importance of medication adherence. If you participate in smoking, it is important to stop using tobacco as this will increase the risks associated with uncontrolled blood pressure.   -Hypertension longstanding Procardia and HCTZ blood  pressure continued to wax and wane on current medications.  Discontinued prescribed amlodipine 10 mg daily losartan/HCTZ 100/25 mg daily.  Patient is adherent with current medications.   Goal BP:  For patients younger than 60: Goal BP < 130/80. For patients 60 and older: Goal BP < 140/90. For patients with diabetes: Goal BP < 130/80. Your most recent BP: 132/89  Minimize salt intake. Minimize alcohol intake    This note has been created with Surveyor, quantity. Any transcriptional errors are unintentional.   Kerin Perna, NP 10/09/2020, 4:40 PM

## 2020-11-14 ENCOUNTER — Telehealth (INDEPENDENT_AMBULATORY_CARE_PROVIDER_SITE_OTHER): Payer: Medicaid Other | Admitting: Primary Care

## 2020-11-28 ENCOUNTER — Other Ambulatory Visit: Payer: Self-pay

## 2020-11-28 ENCOUNTER — Telehealth (INDEPENDENT_AMBULATORY_CARE_PROVIDER_SITE_OTHER): Payer: Medicaid Other | Admitting: Primary Care

## 2020-11-28 ENCOUNTER — Encounter (INDEPENDENT_AMBULATORY_CARE_PROVIDER_SITE_OTHER): Payer: Self-pay | Admitting: Primary Care

## 2020-11-28 DIAGNOSIS — I10 Essential (primary) hypertension: Secondary | ICD-10-CM

## 2020-11-28 DIAGNOSIS — F17210 Nicotine dependence, cigarettes, uncomplicated: Secondary | ICD-10-CM

## 2020-11-28 DIAGNOSIS — Z79899 Other long term (current) drug therapy: Secondary | ICD-10-CM | POA: Diagnosis not present

## 2020-11-28 DIAGNOSIS — Z716 Tobacco abuse counseling: Secondary | ICD-10-CM

## 2020-11-28 NOTE — Progress Notes (Signed)
BP medications are working  Does not regularly check bp at home

## 2020-12-03 NOTE — Progress Notes (Signed)
[facility]  Telephone Note  I connected with FERNANDO TORRY, on 12/03/2020 at 9:19 PM through an audio and video application or by telephone and verified that I am speaking with the correct person using two identifiers.   Consent: I discussed the limitations, risks, security and privacy concerns of performing an evaluation and management service by telephone and the availability of in person appointments. I also discussed with the patient that there may be a patient responsible charge related to this service. The patient expressed understanding and agreed to proceed.   Location of Patient: Home  Location of Provider: [facility]   Persons participating in Telemedicine visit: NERIDA BOIVIN Juluis Mire,  NP Llana Aliment , CMA   History of Present Illness: Ms. Darilyn Storbeck is a 35 year old female who is having a visit for blood pressure follow-up. Denies shortness of breath, headaches, chest pain or lower extremity edema    Past Medical History:  Diagnosis Date   Dyspnea    with pregnancy - exertion   Hypertension    Ovarian cyst    UTI (urinary tract infection)    No Known Allergies  Current Outpatient Medications on File Prior to Visit  Medication Sig Dispense Refill   amLODipine (NORVASC) 10 MG tablet Take 1 tablet (10 mg total) by mouth daily. 90 tablet 1   losartan-hydrochlorothiazide (HYZAAR) 100-25 MG tablet Take 1 tablet by mouth daily. 90 tablet 1   nicotine (NICODERM CQ) 14 mg/24hr patch Place 1 patch (14 mg total) onto the skin daily. (Patient not taking: No sig reported) 28 patch 0   nicotine (NICODERM CQ) 21 mg/24hr patch Place 1 patch (21 mg total) onto the skin daily. (Patient not taking: No sig reported) 28 patch 0   nicotine (NICODERM CQ) 7 mg/24hr patch Place 1 patch (7 mg total) onto the skin daily. (Patient not taking: No sig reported) 28 patch 0   Prenatal Vit-Fe Fumarate-FA  (PRENATAL VITAMINS) 28-0.8 MG TABS Take 1 tablet by mouth daily. (Patient not taking: No sig reported) 30 tablet 12   No current facility-administered medications on file prior to visit.    Observations/Objective: LMP 11/18/2020   Breastfeeding No   Review of systems negative Assessment and Plan: Alizaya was seen today for blood pressure check.  Diagnoses and all orders for this visit:  Essential hypertension She is currently on amlodipine 10 mg daily, and Hyzaar 100/25. Counseled on blood pressure goal of less than 130/80, low-sodium, DASH diet, medication compliance, 150 minutes of moderate intensity exercise per week. Discussed medication compliance, adverse effects.   Tobacco abuse counseling - I have recommended complete cessation of tobacco use. I have discussed various options available for assistance with tobacco cessation including over the counter methods (Nicotine gum, patch and lozenges). We also discussed prescription options (Chantix, Nicotine Inhaler / Nasal Spray). The patient is not interested in pursuing any prescription tobacco cessation options at this time. - Patient declines at this time.  Previously prescribed nicotine patches.  Medication management Lipid Future CBC Future Follow Up Instructions:    I discussed the assessment and treatment plan with the patient. The patient was provided an opportunity to ask questions and all were answered. The patient agreed with the plan and demonstrated an understanding of the instructions.   The patient was advised to call back or seek an in-person evaluation if the symptoms worsen or if the condition fails to improve as anticipated.     I  provided 18 minutes total of non-face-to-face time during this encounter including median intraservice time, reviewing previous notes, investigations, ordering medications, medical decision making, coordinating care and patient verbalized understanding at the end of the  visit.    This note has been created with Surveyor, quantity. Any transcriptional errors are unintentional.   Kerin Perna, NP 12/03/2020, 9:19 PM

## 2021-03-05 ENCOUNTER — Ambulatory Visit (INDEPENDENT_AMBULATORY_CARE_PROVIDER_SITE_OTHER): Payer: Medicaid Other | Admitting: Primary Care

## 2021-03-21 ENCOUNTER — Other Ambulatory Visit (HOSPITAL_COMMUNITY)
Admission: RE | Admit: 2021-03-21 | Discharge: 2021-03-21 | Disposition: A | Discharge status: Home / Self Care | Payer: 59 | Source: Ambulatory Visit | Attending: Primary Care | Admitting: Primary Care

## 2021-03-21 ENCOUNTER — Ambulatory Visit (INDEPENDENT_AMBULATORY_CARE_PROVIDER_SITE_OTHER): Payer: 59 | Admitting: Primary Care

## 2021-03-21 ENCOUNTER — Encounter (INDEPENDENT_AMBULATORY_CARE_PROVIDER_SITE_OTHER): Payer: Self-pay | Admitting: Primary Care

## 2021-03-21 ENCOUNTER — Other Ambulatory Visit: Payer: Self-pay

## 2021-03-21 VITALS — BP 111/77 | HR 95 | Temp 97.5°F | Ht 69.0 in | Wt 166.0 lb

## 2021-03-21 DIAGNOSIS — N898 Other specified noninflammatory disorders of vagina: Secondary | ICD-10-CM

## 2021-03-21 DIAGNOSIS — R350 Frequency of micturition: Secondary | ICD-10-CM | POA: Diagnosis not present

## 2021-03-21 DIAGNOSIS — N3001 Acute cystitis with hematuria: Secondary | ICD-10-CM

## 2021-03-21 LAB — POCT URINALYSIS DIP (CLINITEK)
Bilirubin, UA: NEGATIVE
Glucose, UA: NEGATIVE mg/dL
Ketones, POC UA: NEGATIVE mg/dL
Nitrite, UA: NEGATIVE
POC PROTEIN,UA: NEGATIVE
Spec Grav, UA: 1.025 (ref 1.010–1.025)
Urobilinogen, UA: 0.2 E.U./dL
pH, UA: 5.5 (ref 5.0–8.0)

## 2021-03-21 MED ORDER — FLUCONAZOLE 150 MG PO TABS: 150.0000 mg | ORAL_TABLET | Freq: Once | ORAL | 0 refills | Status: AC

## 2021-03-21 MED ORDER — SULFAMETHOXAZOLE-TRIMETHOPRIM 800-160 MG PO TABS: 1.0000 | ORAL_TABLET | Freq: Two times a day (BID) | ORAL | 0 refills | Status: DC

## 2021-03-21 NOTE — Patient Instructions (Signed)
Urinary Tract Infection, Adult A urinary tract infection (UTI) is an infection of any part of the urinary tract. The urinary tract includes the kidneys, ureters, bladder, and urethra. These organs make, store, and get rid of urine in the body. An upper UTI affects the ureters and kidneys. A lower UTI affects the bladder and urethra. What are the causes? Most urinary tract infections are caused by bacteria in your genital area around your urethra, where urine leaves your body. These bacteria grow and cause inflammation of your urinary tract. What increases the risk? You are more likely to develop this condition if: You have a urinary catheter that stays in place. You are not able to control when you urinate or have a bowel movement (incontinence). You are female and you: Use a spermicide or diaphragm for birth control. Have low estrogen levels. Are pregnant. You have certain genes that increase your risk. You are sexually active. You take antibiotic medicines. You have a condition that causes your flow of urine to slow down, such as: An enlarged prostate, if you are female. Blockage in your urethra. A kidney stone. A nerve condition that affects your bladder control (neurogenic bladder). Not getting enough to drink, or not urinating often. You have certain medical conditions, such as: Diabetes. A weak disease-fighting system (immunesystem). Sickle cell disease. Gout. Spinal cord injury. What are the signs or symptoms? Symptoms of this condition include: Needing to urinate right away (urgency). Frequent urination. This may include small amounts of urine each time you urinate. Pain or burning with urination. Blood in the urine. Urine that smells bad or unusual. Trouble urinating. Cloudy urine. Vaginal discharge, if you are female. Pain in the abdomen or the lower back. You may also have: Vomiting or a decreased appetite. Confusion. Irritability or tiredness. A fever or  chills. Diarrhea. The first symptom in older adults may be confusion. In some cases, they may not have any symptoms until the infection has worsened. How is this diagnosed? This condition is diagnosed based on your medical history and a physical exam. You may also have other tests, including: Urine tests. Blood tests. Tests for STIs (sexually transmitted infections). If you have had more than one UTI, a cystoscopy or imaging studies may be done to determine the cause of the infections. How is this treated? Treatment for this condition includes: Antibiotic medicine. Over-the-counter medicines to treat discomfort. Drinking enough water to stay hydrated. If you have frequent infections or have other conditions such as a kidney stone, you may need to see a health care provider who specializes in the urinary tract (urologist). In rare cases, urinary tract infections can cause sepsis. Sepsis is a life-threatening condition that occurs when the body responds to an infection. Sepsis is treated in the hospital with IV antibiotics, fluids, and other medicines. Follow these instructions at home: Medicines Take over-the-counter and prescription medicines only as told by your health care provider. If you were prescribed an antibiotic medicine, take it as told by your health care provider. Do not stop using the antibiotic even if you start to feel better. General instructions Make sure you: Empty your bladder often and completely. Do not hold urine for long periods of time. Empty your bladder after sex. Wipe from front to back after urinating or having a bowel movement if you are female. Use each tissue only one time when you wipe. Drink enough fluid to keep your urine pale yellow. Keep all follow-up visits. This is important. Contact a health care provider   if: Your symptoms do not get better after 1-2 days. Your symptoms go away and then return. Get help right away if: You have severe pain in your  back or your lower abdomen. You have a fever or chills. You have nausea or vomiting. Summary A urinary tract infection (UTI) is an infection of any part of the urinary tract, which includes the kidneys, ureters, bladder, and urethra. Most urinary tract infections are caused by bacteria in your genital area. Treatment for this condition often includes antibiotic medicines. If you were prescribed an antibiotic medicine, take it as told by your health care provider. Do not stop using the antibiotic even if you start to feel better. Keep all follow-up visits. This is important. This information is not intended to replace advice given to you by your health care provider. Make sure you discuss any questions you have with your health care provider. Document Revised: 12/24/2019 Document Reviewed: 12/24/2019 Elsevier Patient Education  2022 Elsevier Inc.  

## 2021-03-21 NOTE — Progress Notes (Signed)
    [facility]   Subjective:   Ann Bruce is a 35 y.o. [phone] female complains of an abnormal vaginal discharge for 4 days. Discharge described as: white, thick, and malodorous. Vaginal symptoms include burning and local irritation.Vulvar symptoms include local irritation and urinary symptoms of dysuria and urinary urgency.STI Risk: Very low risk of STD exposure.   Other associated symptoms: none.Menstrual pattern: She had been bleeding regularly. Contraception: tubal ligation.  She denies vomiting, fussiness, diarrhea, and difficulty breathing recent antibiotic exposure, reports  changing Dove soaps to scented was only using fragrance free.Marland Kitchen No changes in detergents coinciding with the onset of her symptoms.  She has not previously self treated or been under treatment by another provider for these symptoms.     Gynecologic History Patient's last menstrual period was 03/05/2021 (exact date). Contraception: none  No problems updated.  No Known Allergies  Past Medical History:  Diagnosis Date   Dyspnea    with pregnancy - exertion   Hypertension    Ovarian cyst    UTI (urinary tract infection)     Current Outpatient Medications on File Prior to Visit  Medication Sig Dispense Refill   amLODipine (NORVASC) 10 MG tablet Take 1 tablet (10 mg total) by mouth daily. 90 tablet 1   losartan-hydrochlorothiazide (HYZAAR) 100-25 MG tablet Take 1 tablet by mouth daily. 90 tablet 1   No current facility-administered medications on file prior to visit.   Objective:   Vitals:   03/21/21 1427  BP: 111/77  Pulse: 95  Temp: (!) 97.5 F (36.4 C)  TempSrc: Temporal  SpO2: 97%  Weight: 166 lb (75.3 kg)  Height: 5\' 9"  (1.753 m)   ROS Comprehensive ROS pertinent positive and negative noted in HPI  Exam General appearance : Awake, alert, not in any distress. Speech Clear. Not toxic looking HEENT: Atraumatic and Normocephalic, pupils equally reactive to light and  accomodation Neck: Supple, no JVD. No cervical lymphadenopathy.  Chest: Good air entry bilaterally, no added sounds  CVS: S1 S2 regular, no murmurs.  Abdomen: Bowel sounds present, Non tender and not distended with no gaurding, rigidity or rebound. Extremities: B/L Lower Ext shows no edema, both legs are warm to touch Neurology: Awake alert, and oriented X 3, Non focal Skin: No Rash   Assessment & Plan  Leyani was seen today for blood pressure check and vaginal discharge.  Diagnoses and all orders for this visit:  Urinary frequency -     POCT URINALYSIS DIP (CLINITEK)  Vaginal discharge -     Cervicovaginal ancillary only    This note has been created with Surveyor, quantity. Any transcriptional errors are unintentional.   Kerin Perna, NP 03/21/2021, 2:47 PM .

## 2021-03-30 ENCOUNTER — Other Ambulatory Visit (INDEPENDENT_AMBULATORY_CARE_PROVIDER_SITE_OTHER): Payer: Medicaid Other

## 2021-04-02 ENCOUNTER — Other Ambulatory Visit: Payer: Self-pay

## 2021-04-02 ENCOUNTER — Other Ambulatory Visit (INDEPENDENT_AMBULATORY_CARE_PROVIDER_SITE_OTHER): Payer: Medicaid Other

## 2021-04-02 ENCOUNTER — Other Ambulatory Visit (INDEPENDENT_AMBULATORY_CARE_PROVIDER_SITE_OTHER): Payer: Self-pay | Admitting: Primary Care

## 2021-04-02 DIAGNOSIS — I10 Essential (primary) hypertension: Secondary | ICD-10-CM

## 2021-04-02 DIAGNOSIS — Z79899 Other long term (current) drug therapy: Secondary | ICD-10-CM

## 2021-04-03 ENCOUNTER — Other Ambulatory Visit (INDEPENDENT_AMBULATORY_CARE_PROVIDER_SITE_OTHER): Payer: Medicaid Other

## 2021-04-03 LAB — CBC WITH DIFFERENTIAL/PLATELET
Basophils Absolute: 0 10*3/uL (ref 0.0–0.2)
Basos: 1 %
EOS (ABSOLUTE): 0.1 10*3/uL (ref 0.0–0.4)
Eos: 2 %
Hematocrit: 39.1 % (ref 34.0–46.6)
Hemoglobin: 13.1 g/dL (ref 11.1–15.9)
Immature Grans (Abs): 0 10*3/uL (ref 0.0–0.1)
Immature Granulocytes: 0 %
Lymphocytes Absolute: 1.6 10*3/uL (ref 0.7–3.1)
Lymphs: 48 %
MCH: 30.6 pg (ref 26.6–33.0)
MCHC: 33.5 g/dL (ref 31.5–35.7)
MCV: 91 fL (ref 79–97)
Monocytes Absolute: 0.4 10*3/uL (ref 0.1–0.9)
Monocytes: 10 %
Neutrophils Absolute: 1.3 10*3/uL — ABNORMAL LOW (ref 1.4–7.0)
Neutrophils: 39 %
Platelets: 238 10*3/uL (ref 150–450)
RBC: 4.28 x10E6/uL (ref 3.77–5.28)
RDW: 11.8 % (ref 11.7–15.4)
WBC: 3.4 10*3/uL (ref 3.4–10.8)

## 2021-04-03 LAB — LIPID PANEL
Chol/HDL Ratio: 4.7 ratio — ABNORMAL HIGH (ref 0.0–4.4)
Cholesterol, Total: 210 mg/dL — ABNORMAL HIGH (ref 100–199)
HDL: 45 mg/dL (ref >39)
LDL Chol Calc (NIH): 150 mg/dL — ABNORMAL HIGH (ref 0–99)
Triglycerides: 81 mg/dL (ref 0–149)
VLDL Cholesterol Cal: 15 mg/dL (ref 5–40)

## 2021-04-03 LAB — CMP14+EGFR
ALT: 14 IU/L (ref 0–32)
AST: 16 IU/L (ref 0–40)
Albumin/Globulin Ratio: 1.6 (ref 1.2–2.2)
Albumin: 4.3 g/dL (ref 3.8–4.8)
Alkaline Phosphatase: 47 IU/L (ref 44–121)
BUN/Creatinine Ratio: 14 (ref 9–23)
BUN: 10 mg/dL (ref 6–20)
Bilirubin Total: 0.4 mg/dL (ref 0.0–1.2)
CO2: 26 mmol/L (ref 20–29)
Calcium: 9.1 mg/dL (ref 8.7–10.2)
Chloride: 102 mmol/L (ref 96–106)
Creatinine, Ser: 0.72 mg/dL (ref 0.57–1.00)
Globulin, Total: 2.7 g/dL (ref 1.5–4.5)
Glucose: 90 mg/dL (ref 70–99)
Potassium: 3.8 mmol/L (ref 3.5–5.2)
Sodium: 141 mmol/L (ref 134–144)
Total Protein: 7 g/dL (ref 6.0–8.5)
eGFR: 111 mL/min/{1.73_m2} (ref >59)

## 2021-04-05 ENCOUNTER — Other Ambulatory Visit (INDEPENDENT_AMBULATORY_CARE_PROVIDER_SITE_OTHER): Payer: Self-pay | Admitting: Primary Care

## 2021-04-05 DIAGNOSIS — E782 Mixed hyperlipidemia: Secondary | ICD-10-CM

## 2021-04-05 MED ORDER — ATORVASTATIN CALCIUM 20 MG PO TABS: 20.0000 mg | ORAL_TABLET | Freq: Every day | ORAL | 1 refills | Status: DC

## 2021-04-14 ENCOUNTER — Other Ambulatory Visit (INDEPENDENT_AMBULATORY_CARE_PROVIDER_SITE_OTHER): Payer: Self-pay | Admitting: Primary Care

## 2021-04-14 DIAGNOSIS — I10 Essential (primary) hypertension: Secondary | ICD-10-CM

## 2021-04-14 NOTE — Telephone Encounter (Signed)
Requested Prescriptions  Pending Prescriptions Disp Refills  . amLODipine (NORVASC) 10 MG tablet [Pharmacy Med Name: AMLODIPINE BESYLATE 10MG  TABLETS] 90 tablet 1    Sig: TAKE 1 TABLET(10 MG) BY MOUTH DAILY     Cardiovascular:  Calcium Channel Blockers Passed - 04/14/2021 11:34 AM      Passed - Last BP in normal range    BP Readings from Last 1 Encounters:  03/21/21 111/77         Passed - Valid encounter within last 6 months    Recent Outpatient Visits          3 weeks ago Urinary frequency   [place], [place], NP   4 months ago Essential hypertension   [place], Michelle P, NP   6 months ago Essential hypertension   [place], Michelle P, NP   7 months ago Essential hypertension   [place], Michelle P, NP   9 months ago Encounter to establish care   [place] Juluis Mire P, NP             . losartan-hydrochlorothiazide (HYZAAR) 100-25 MG tablet [Pharmacy Med Name: LOSARTAN/HCTZ 100/25MG  TABLETS] 90 tablet 1    Sig: TAKE 1 TABLET BY MOUTH DAILY     Cardiovascular: ARB + Diuretic Combos Passed - 04/14/2021 11:34 AM      Passed - K in normal range and within 180 days    Potassium  Date Value Ref Range Status  04/02/2021 3.8 3.5 - 5.2 mmol/L Final         Passed - Na in normal range and within 180 days    Sodium  Date Value Ref Range Status  04/02/2021 141 134 - 144 mmol/L Final         Passed - Cr in normal range and within 180 days    Creatinine, Ser  Date Value Ref Range Status  04/02/2021 0.72 0.57 - 1.00 mg/dL Final   Creatinine, Urine  Date Value Ref Range Status  12/24/2019 38.98 mg/dL Final         Passed - Ca in normal range and within 180 days    Calcium  Date Value Ref Range Status  04/02/2021 9.1 8.7 - 10.2 mg/dL Final   Calcium, Ion  Date Value Ref Range Status  09/17/2014  1.19 1.12 - 1.23 mmol/L Final         Passed - Patient is not pregnant      Passed - Last BP in normal range    BP Readings from Last 1 Encounters:  03/21/21 111/77         Passed - Valid encounter within last 6 months    Recent Outpatient Visits          3 weeks ago Urinary frequency   [place], Michelle P, NP   4 months ago Essential hypertension   [place], Michelle P, NP   6 months ago Essential hypertension   [place], Michelle P, NP   7 months ago Essential hypertension   [place], Michelle P, NP   9 months ago Encounter to establish care   [place] Kerin Perna, NP

## 2021-07-21 IMAGING — MR MR PELVIS W/O CM
5 of 8 series · 27 of 48 positions shown · non-contrast
Comparison: Ultrasound on 05/14/2019

CLINICAL DATA: Bilateral adnexal masses. Previous surgical
resection of ovarian dermoids. Second trimester pregnancy.

EXAM:
MRI PELVIS WITHOUT CONTRAST
TECHNIQUE: Multiplanar multisequence MR imaging of the pelvis was performed. No
intravenous contrast was administered.

[Series 1: 3 plane loc · axial · 3.0mm · 1.56mm/px · z∈[-18,+200]mm · 5 of 27 slices shown]
[im 1/27]
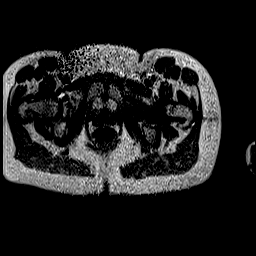
[im 7/27]
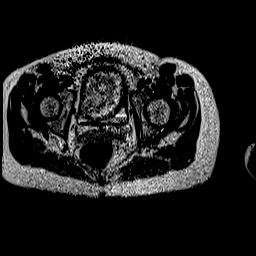
[im 14/27]
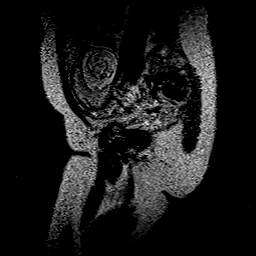
[im 20/27]
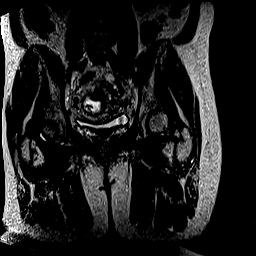
[im 27/27]
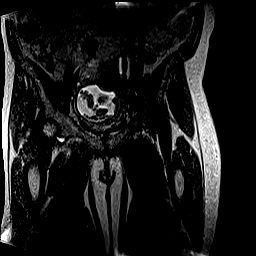

[Series 2: T2 · coronal · 6.0mm · 1.56mm/px · 5 of 30 slices shown (1 of 3)]
[im 1/30]
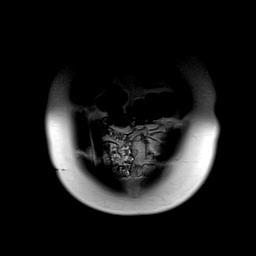
[im 8/30]
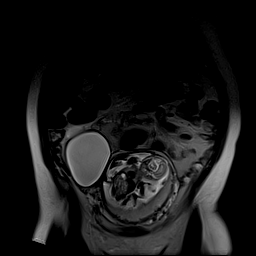
[im 15/30]
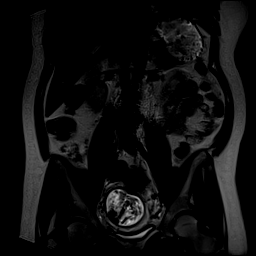
[im 22/30]
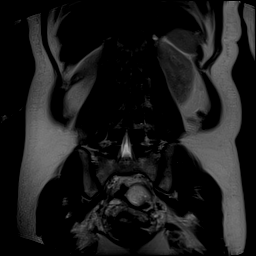
[im 30/30]
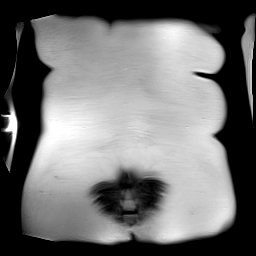

[Series 3: T2 · axial · 5.0mm · 0.51mm/px · z∈[-49,+149]mm · 6 of 34 slices shown (2 of 3)]
[im 1/34]
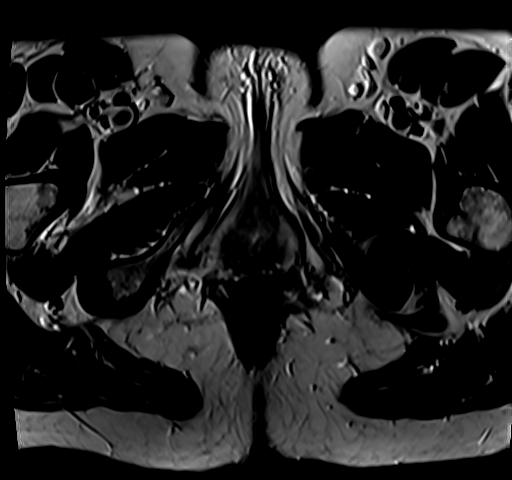
[im 7/34]
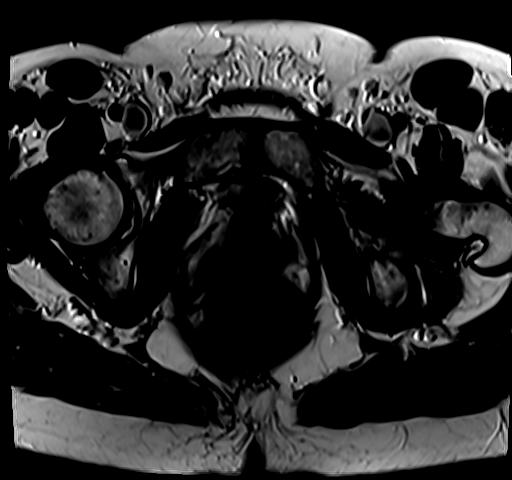
[im 14/34]
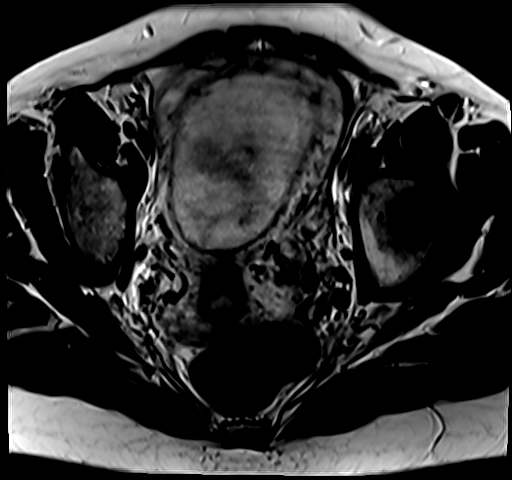
[im 20/34]
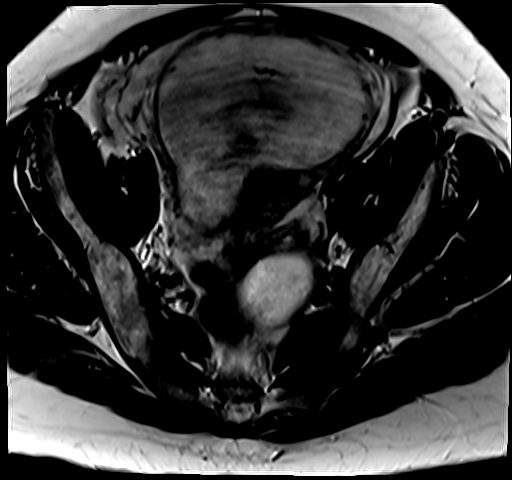
[im 27/34]
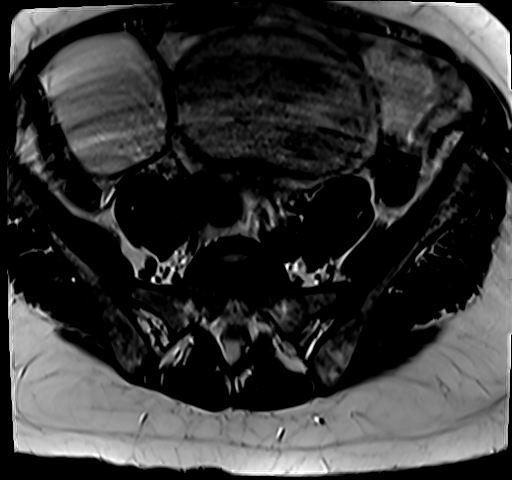
[im 34/34]
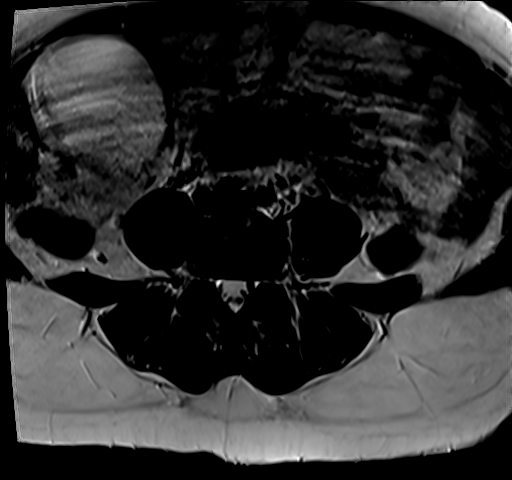

[Series 4: T2 · axial · 5.0mm · 0.51mm/px · z∈[-49,+149]mm · 6 of 34 slices shown (3 of 3)]
[im 1/34]
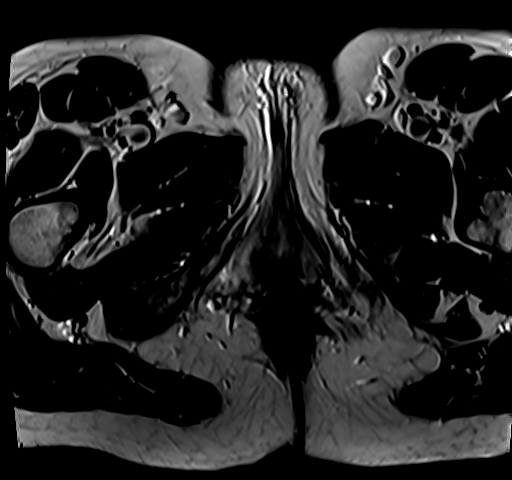
[im 7/34]
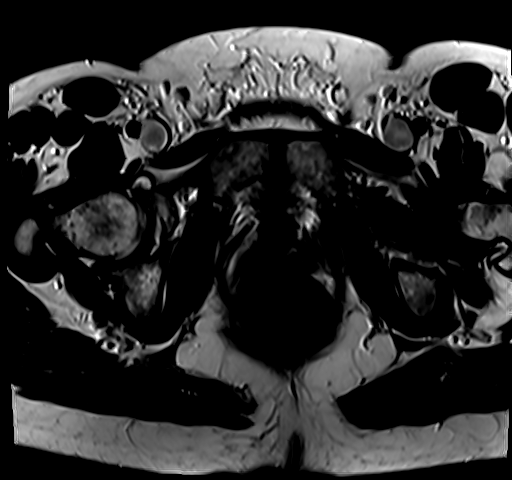
[im 14/34]
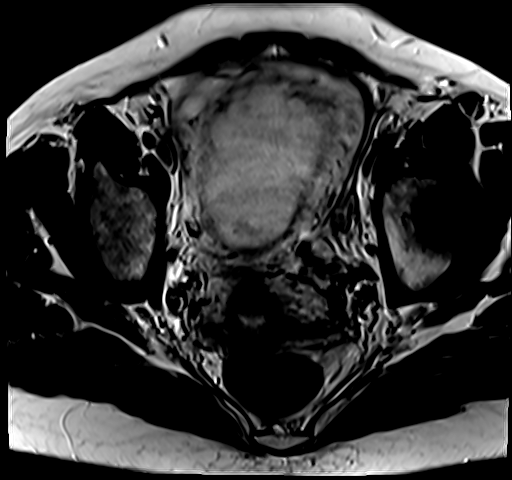
[im 20/34]
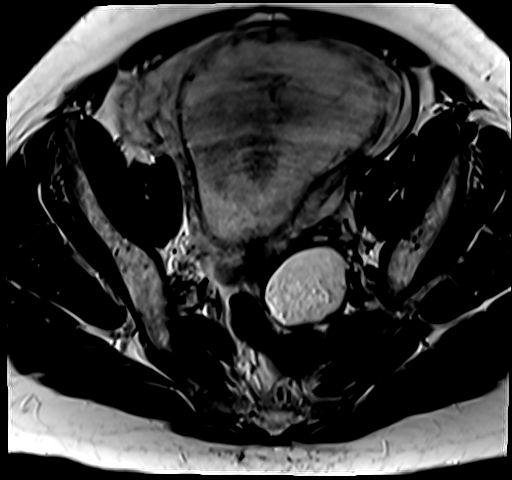
[im 27/34]
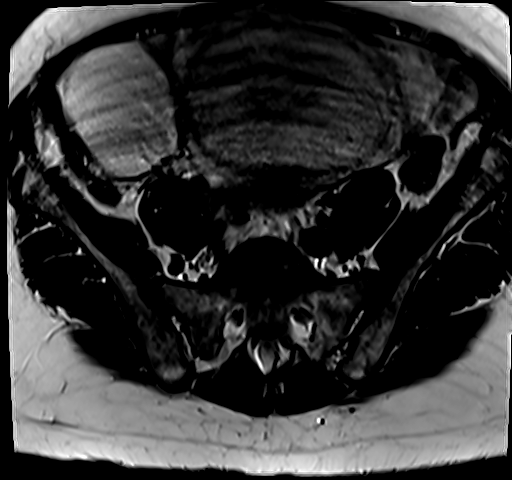
[im 34/34]
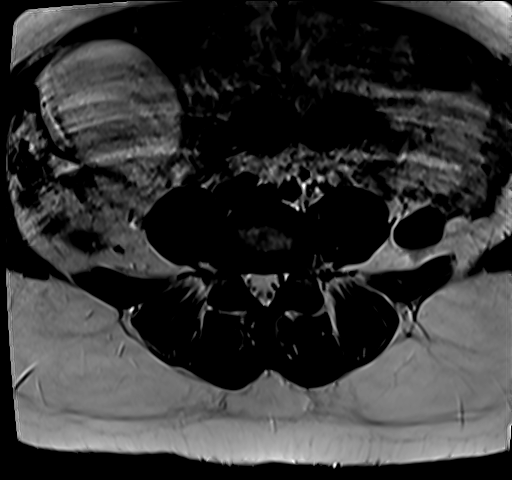

[Series 7: T2 fat-sat · axial · 5.0mm · 0.51mm/px · z∈[-34,+140]mm · 5 of 38 slices shown]
[im 1/38]
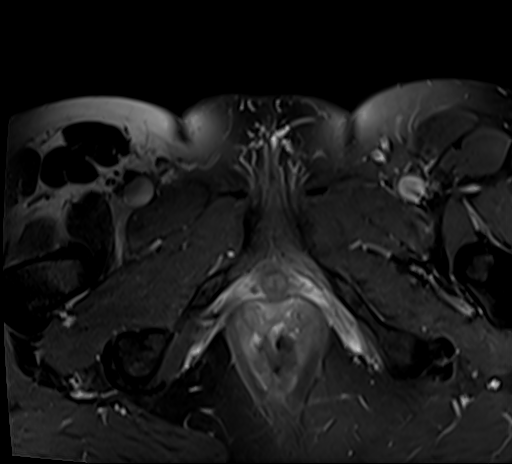
[im 8/38]
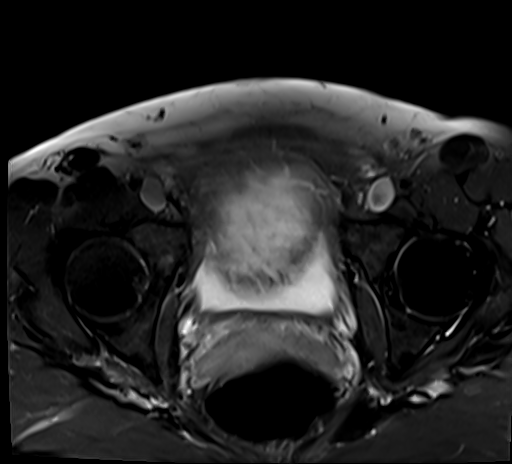
[im 15/38]
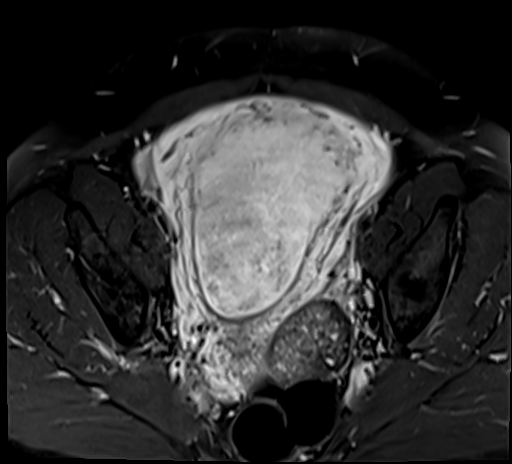
[im 23/38]
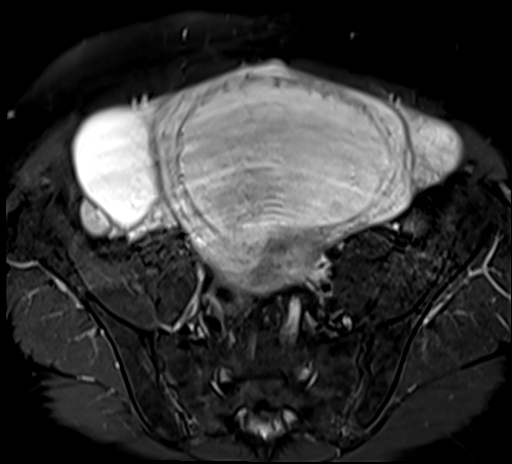
[im 30/38]
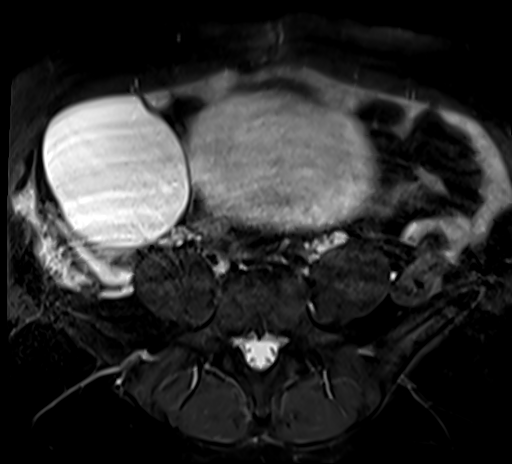

[27 of 48 positions shown; findings below may reference images not displayed]

FINDINGS: Lower Urinary Tract: No urinary bladder or urethral abnormality
identified.

Bowel: Unremarkable appearance of rectum and other pelvic bowel
loops.

Vascular/Lymphatic: Unremarkable. No pathologically enlarged pelvic
lymph nodes identified.

Reproductive:

-- Uterus: A single intrauterine pregnancy is seen. Fetus is in
breech presentation. Cervix is long and closed.

-- Right ovary: A cystic lesion is seen in the right adnexa which
measures 11.2 x 8.1 cm, and has a thin internal partial septation or
fold. No thickened septations, mural nodularity or soft tissue
component identified.

-- Left ovary: A lobular soft tissue mass is seen in the left adnexa
which measures 5.9 x 4.3 cm and has homogeneous fat signal
intensity. This consistent with a benign ovarian dermoid.

Other: No peritoneal thickening or abnormal free fluid.

Musculoskeletal:  Unremarkable.
IMPRESSION: 1. Single intrauterine pregnancy.
2. 5.9 cm benign left ovarian dermoid.
3. 11.2 cm cystic lesion in the right adnexa with probably benign
characteristics. Continued follow-up is recommended by pelvic MRI
without contrast in 8-12 weeks.

## 2021-07-27 IMAGING — US US MFM OB DETAIL+14 WK
1 series · 12 of 28 positions shown · non-contrast
Comparison: none

[Series 1: us mfm ob detail+14 wk · 12 of 70 slices shown]
[im 3/70]
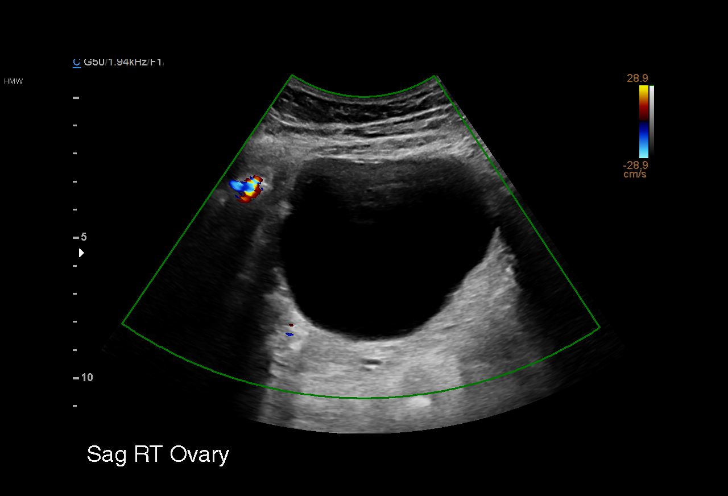
[im 8/70]
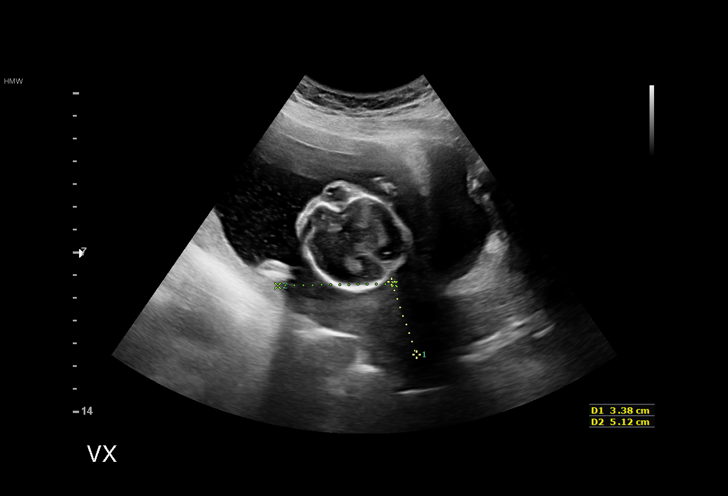
[im 13/70]
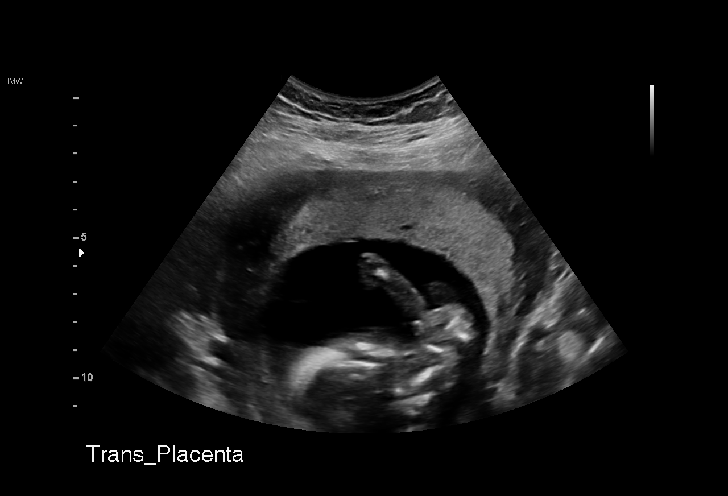
[im 21/70]
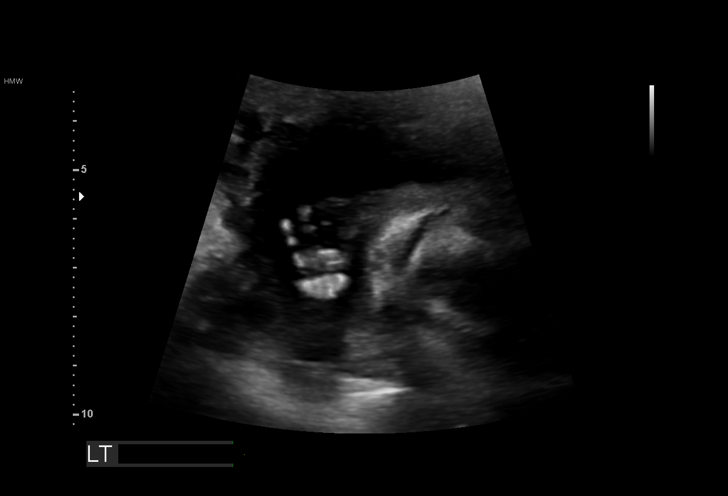
[im 26/70]
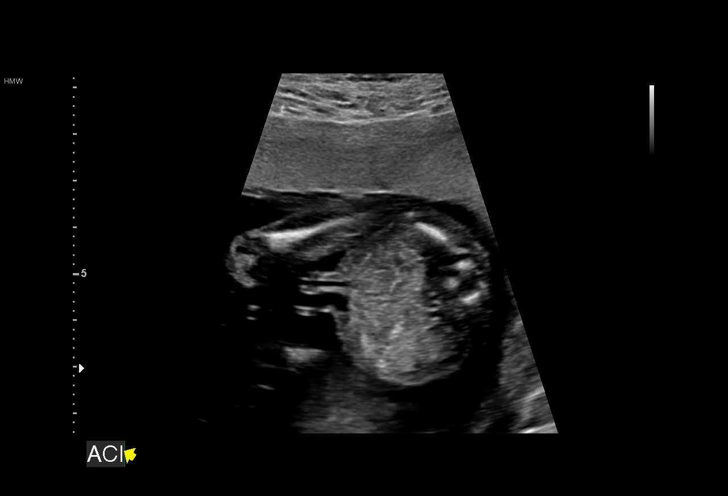
[im 31/70]
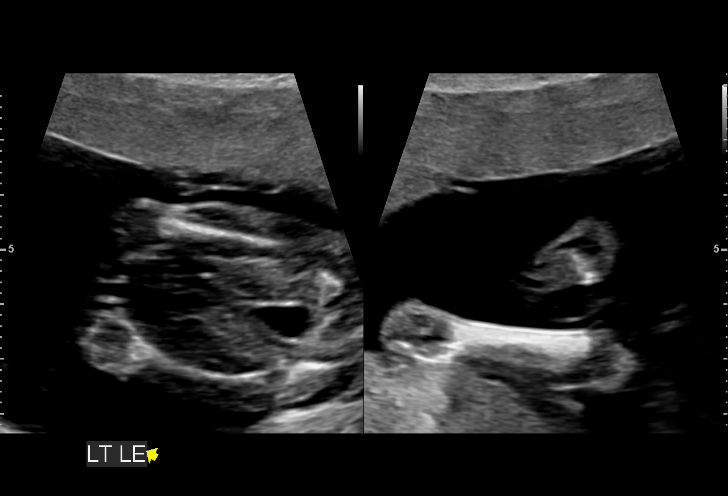
[im 39/70]
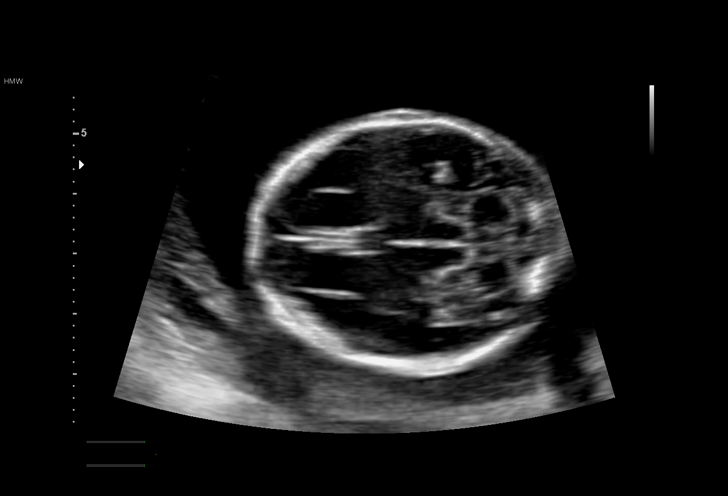
[im 44/70]
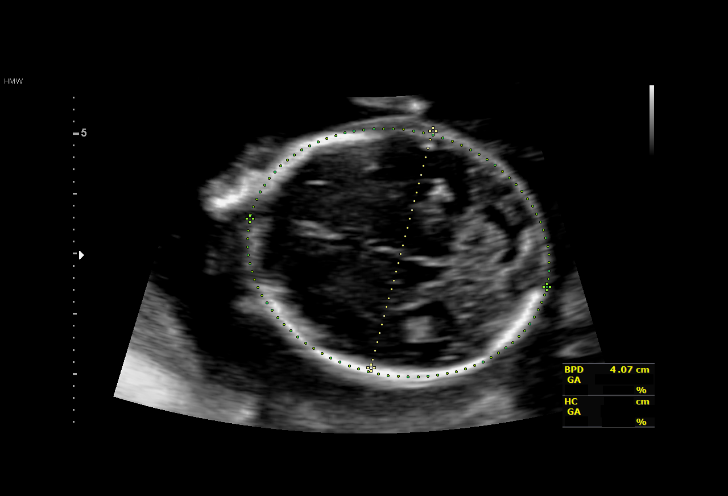
[im 49/70]
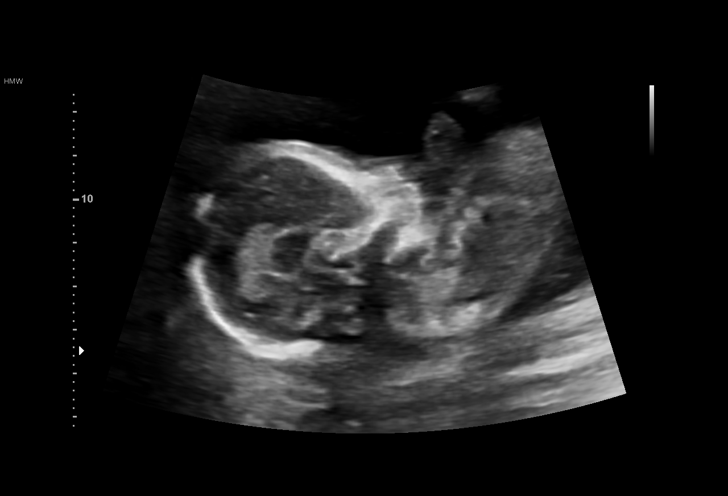
[im 57/70]
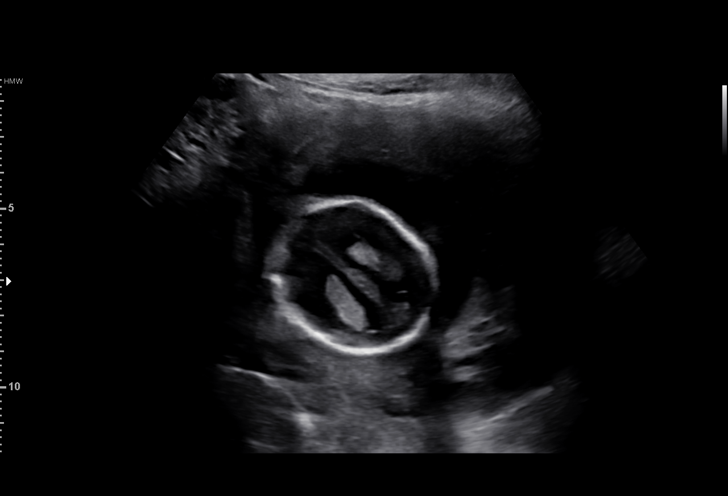
[im 62/70]
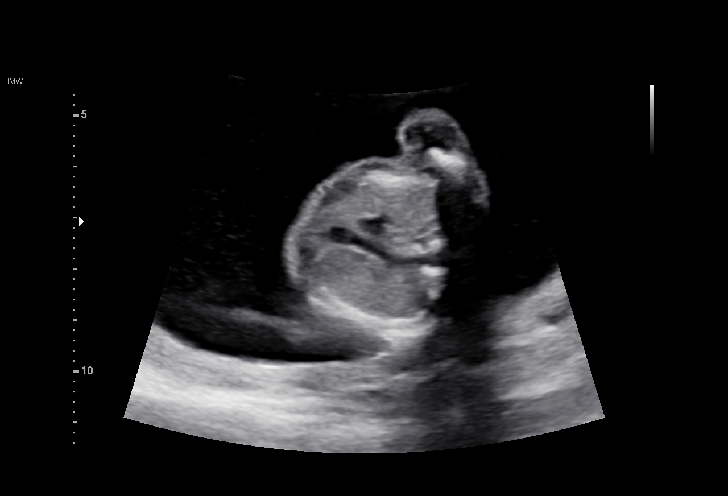
[im 67/70]
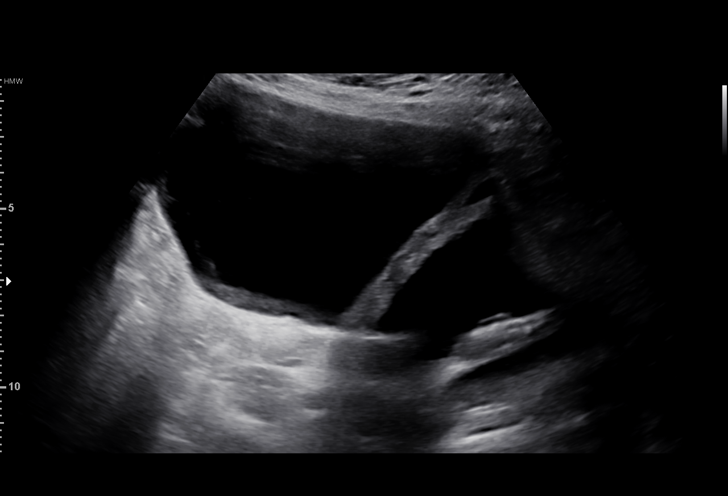

[12 of 28 positions shown; findings below may reference images not displayed]

----------------------------------------------------------------------

 ----------------------------------------------------------------------
Indications

  Advanced maternal age multigravida 34+,
  second trimester
  17 weeks gestation of pregnancy
  Encounter for antenatal screening for
  malformations
  Low risk NIPS
  Obesity complicating pregnancy, second
  trimester
  Medical complication of pregnancy (bilateral
  ovarian cyst surgery 08/04/19)
  Hypertension - Chronic/Pre-existing (ASA)
 ----------------------------------------------------------------------
Fetal Evaluation

 Num Of Fetuses:         1
 Fetal Heart Rate(bpm):  151
 Cardiac Activity:       Observed
 Presentation:           Cephalic
 Placenta:               Anterior
 P. Cord Insertion:      Visualized, central

 Amniotic Fluid
 AFI FV:      Within normal limits

                             Largest Pocket(cm)

Biometry

 BPD:      40.5  mm     G. Age:  18w 2d         85  %    CI:        76.14   %    70 - 86
                                                         FL/HC:      18.4   %    14.6 -
 HC:      147.1  mm     G. Age:  17w 6d         64  %    HC/AC:      1.13        1.07 -
 AC:      130.1  mm     G. Age:  18w 4d         83  %    FL/BPD:     66.9   %
 FL:       27.1  mm     G. Age:  18w 2d         76  %    FL/AC:      20.8   %    20 - 24
 HUM:      26.8  mm     G. Age:  18w 4d         85  %
 CER:      18.5  mm     G. Age:  18w 2d         70  %
 NFT:       3.8  mm
 CM:          5  mm

 Est. FW:     236  gm      0 lb 8 oz     93  %
OB History

 Gravidity:    3         Term:   1        Prem:   1        SAB:   0
 TOP:          0       Ectopic:  0        Living: 2
Gestational Age

 LMP:           17w 3d        Date:  04/03/19                 EDD:   01/08/20
 U/S Today:     18w 2d                                        EDD:   01/02/20
 Best:          17w 3d     Det. By:  LMP  (04/03/19)          EDD:   01/08/20
Anatomy

 Cranium:               Appears normal         Aortic Arch:            Not well visualized
 Cavum:                 Appears normal         Ductal Arch:            Not well visualized
 Ventricles:            Appears normal         Diaphragm:              Appears normal
 Choroid Plexus:        Appears normal         Stomach:                Appears normal, left
                                                                       sided
 Cerebellum:            Appears normal         Abdomen:                Appears normal
 Posterior Fossa:       Appears normal         Abdominal Wall:         Appears nml (cord
                                                                       insert, abd wall)
 Nuchal Fold:           Appears normal         Cord Vessels:           Appears normal (3
                                                                       vessel cord)
 Face:                  Appears normal         Kidneys:                Appear normal
                        (orbits and profile)
 Lips:                  Appears normal         Bladder:                Appears normal
 Thoracic:              Appears normal         Spine:                  Not well visualized
 Heart:                 Not well visualized    Upper Extremities:      Appears normal
 RVOT:                  Not well visualized    Lower Extremities:      Appears normal
 LVOT:                  Not well visualized

 Other:  Technically difficult due to early gestational age. Technically difficult
         due to maternal habitus and fetal position. Heels and 5th digit
         visualized.
Cervix Uterus Adnexa

 Cervix
 Length:            3.4  cm.
 Normal appearance by transabdominal scan.

 Uterus
 No abnormality visualized.

 Left Ovary
 No adnexal mass visualized.

 Right Ovary
 No adnexal mass visualized.

 Cul De Sac
 No free fluid seen.

 Adnexa
 No abnormality visualized.
Comments

 This patient was seen for a detailed fetal anatomy scan due
 to advanced maternal age and history of chronic
 hypertension currently treated with labetalol.  The patient
 reports that she has an 11 cm adnexal cyst that was noted on
 a prior MRI exam.  She is scheduled for surgery tomorrow for
 removal of this adnexal cyst.
 She denies any other significant past medical history and
 denies any problems in her current pregnancy.
 She had a cell free DNA test earlier in her pregnancy which
 indicated a low risk for trisomy 21, 18, and 13. A male fetus is
 predicted.
 She was informed that the fetal growth and amniotic fluid
 level were appropriate for her gestational age.
 The views of the fetal anatomy were limited today due to her
 early gestational age.
 The patient was informed that anomalies may be missed due
 to technical limitations. If the fetus is in a suboptimal position
 or maternal habitus is increased, visualization of the fetus in
 the maternal uterus may be impaired.
 An 11 cm simple appearing adnexal cyst with a single
 septation most likely located on the right side was noted
 today.
 The implications and management of chronic hypertension in
 pregnancy was discussed. The patient was advised that
 should her blood pressures continue to be elevated, the
 dosage of her antihypertensive medications may need to be
 increased.  The increased risk of superimposed
 preeclampsia, an indicated preterm delivery, and possible
 fetal growth restriction due to chronic hypertension in
 pregnancy was discussed. The patient was advised that we
 will continue to follow her closely throughout her pregnancy.
 We will continue to follow her with monthly growth scans.
 Weekly fetal testing should be started at around 32 weeks.
 To decrease her risk of superimposed preeclampsia, she
 should continue taking a daily baby aspirin (81 mg daily) for
 preeclampsia prophylaxis.
 The increased risk of fetal aneuploidy due to advanced
 maternal age was discussed. Due to advanced maternal age,
 the patient was offered and declined an amniocentesis today
 for definitive diagnosis of fetal aneuploidy.
 A follow-up exam was scheduled in 4 weeks to complete the
 views of the fetal anatomy.

## 2021-11-15 ENCOUNTER — Other Ambulatory Visit (INDEPENDENT_AMBULATORY_CARE_PROVIDER_SITE_OTHER): Payer: Self-pay | Admitting: Primary Care

## 2021-11-15 DIAGNOSIS — I10 Essential (primary) hypertension: Secondary | ICD-10-CM

## 2021-11-15 MED ORDER — AMLODIPINE BESYLATE 10 MG PO TABS: ORAL_TABLET | ORAL | 0 refills | Status: DC

## 2021-11-15 NOTE — Telephone Encounter (Signed)
Medication Refill - Medication: amLODipine (NORVASC) 10 MG tablet  Has the patient contacted their pharmacy? No. (Agent: If no, request that the patient contact the pharmacy for the refill. If patient does not wish to contact the pharmacy document the reason why and proceed with request.) (Agent: If yes, when and what did the pharmacy advise?)  Preferred Pharmacy (with phone number or street name): [facility] [address], [address]  [place], [place] [address]  Phone:  [phone]  Fax:  [phone]  Has the patient been seen for an appointment in the last year OR does the patient have an upcoming appointment? Yes.    Agent: Please be advised that RX refills may take up to 3 business days. We ask that you follow-up with your pharmacy.

## 2021-11-15 NOTE — Telephone Encounter (Signed)
Requested Prescriptions  Pending Prescriptions Disp Refills  . amLODipine (NORVASC) 10 MG tablet 90 tablet 0    Sig: TAKE 1 TABLET(10 MG) BY MOUTH DAILY     Cardiovascular: Calcium Channel Blockers 2 Failed - 11/15/2021 11:42 AM      Failed - Valid encounter within last 6 months    Recent Outpatient Visits          7 months ago Urinary frequency   [place], Michelle P, NP   11 months ago Essential hypertension   [place], [place], NP   1 year ago Essential hypertension   [place], [place], NP   1 year ago Essential hypertension   [place] Kerin Perna, NP   1 year ago Encounter to establish care   [place] Kerin Perna, NP      Future Appointments            In 3 weeks Kerin Perna, NP [facility] BP in normal range    BP Readings from Last 1 Encounters:  03/21/21 111/77         Passed - Last Heart Rate in normal range    Pulse Readings from Last 1 Encounters:  03/21/21 95

## 2021-12-10 ENCOUNTER — Ambulatory Visit (INDEPENDENT_AMBULATORY_CARE_PROVIDER_SITE_OTHER): Payer: Medicaid Other | Admitting: Primary Care

## 2021-12-10 ENCOUNTER — Encounter (INDEPENDENT_AMBULATORY_CARE_PROVIDER_SITE_OTHER): Payer: Self-pay | Admitting: Primary Care

## 2021-12-10 ENCOUNTER — Other Ambulatory Visit (HOSPITAL_COMMUNITY)
Admission: RE | Admit: 2021-12-10 | Discharge: 2021-12-10 | Disposition: A | Discharge status: Home / Self Care | Payer: Medicaid Other | Source: Ambulatory Visit | Attending: Primary Care | Admitting: Primary Care

## 2021-12-10 VITALS — BP 127/90 | HR 95 | Temp 98.7°F | Ht 69.0 in | Wt 165.4 lb

## 2021-12-10 DIAGNOSIS — I10 Essential (primary) hypertension: Secondary | ICD-10-CM

## 2021-12-10 DIAGNOSIS — Z114 Encounter for screening for human immunodeficiency virus [HIV]: Secondary | ICD-10-CM

## 2021-12-10 DIAGNOSIS — N898 Other specified noninflammatory disorders of vagina: Secondary | ICD-10-CM | POA: Diagnosis present

## 2021-12-10 MED ORDER — AMLODIPINE BESYLATE 10 MG PO TABS: ORAL_TABLET | ORAL | 1 refills | Status: DC

## 2021-12-11 LAB — HIV ANTIBODY (ROUTINE TESTING W REFLEX): HIV Screen 4th Generation wRfx: NONREACTIVE

## 2021-12-12 LAB — CERVICOVAGINAL ANCILLARY ONLY
Bacterial Vaginitis (gardnerella): POSITIVE — AB
Candida Glabrata: NEGATIVE
Candida Vaginitis: NEGATIVE
Chlamydia: NEGATIVE
Comment: NEGATIVE
Comment: NEGATIVE
Comment: NEGATIVE
Comment: NEGATIVE
Comment: NEGATIVE
Comment: NORMAL
Neisseria Gonorrhea: NEGATIVE
Trichomonas: NEGATIVE

## 2021-12-13 ENCOUNTER — Other Ambulatory Visit (INDEPENDENT_AMBULATORY_CARE_PROVIDER_SITE_OTHER): Payer: Self-pay | Admitting: Primary Care

## 2021-12-13 DIAGNOSIS — B9689 Other specified bacterial agents as the cause of diseases classified elsewhere: Secondary | ICD-10-CM

## 2021-12-13 MED ORDER — METRONIDAZOLE 500 MG PO TABS: 500.0000 mg | ORAL_TABLET | Freq: Two times a day (BID) | ORAL | 0 refills | Status: DC

## 2021-12-13 NOTE — Progress Notes (Signed)
[facility]   CC: STI Exposure  SUBJECTIVE:  Ann Bruce is a 36 y.o. female who presents with complaints of abrupt gradual vaginal discharge} that began 3 ago.  Patient denies a precipitating event, recent sexual encounter or recent antibiotic use.  Patient is sexually active with 1 of female partners.  Describes discharge as thick/ thin and white/ yellow.  Patient has tried OTC medications with/ without relief.   Patient denies fever, chills, nausea, vomiting, abdominal or pelvic pain, urinary symptoms, genital itching, odor and/or bleeding, dyspareunia rashes or lesions.   Patient's last menstrual period was 11/16/2021. Current birth control method: Compliant with BC:  ROS: As per HPI.  All other pertinent ROS negative.     Past Medical History:  Diagnosis Date   Dyspnea    with pregnancy - exertion   Hypertension    Ovarian cyst    UTI (urinary tract infection)    Past Surgical History:  Procedure Laterality Date   APPENDECTOMY     LAPAROSCOPIC TUBAL LIGATION Bilateral 03/22/2020   Procedure: LAPAROSCOPIC TUBAL LIGATION WITH FILSHIE CLIPS;  Surgeon: [name], MD;  Location: [facility];  Service: Gynecology;  Laterality: Bilateral;   LAPAROTOMY Bilateral 08/04/2019   Procedure: EXPLORATORY LAPAROTOMY WITH BILATERAL OVARIAN CYSTECTOMY;  Surgeon: Osborne Oman, MD;  Location: [facility];  Service: Gynecology;  Laterality: Bilateral;  Dr. Harolyn Rutherford will check fetal heart tones before and after surgery   OVARIAN CYST SURGERY  2009   VAGINAL DELIVERY  2009, 2007   x 2   No Known Allergies Current Outpatient Medications on File Prior to Visit  Medication Sig Dispense Refill   atorvastatin (LIPITOR) 20 MG tablet Take 1 tablet (20 mg total) by mouth daily. 90 tablet 1   losartan-hydrochlorothiazide (HYZAAR) 100-25 MG tablet TAKE 1 TABLET BY MOUTH DAILY 90 tablet 1   No current facility-administered medications on file prior to visit.     Social History   Socioeconomic History   Marital status: Single    Spouse name: Not on file   Number of children: 2   Years of education: Not on file   Highest education level: Some college, no degree  Occupational History   Not on file  Tobacco Use   Smoking status: Every Day    Packs/day: 0.25    Years: 15.00    Total pack years: 3.75    Types: Cigarettes   Smokeless tobacco: Never   Tobacco comments:    smokes every 3 days  or when stresses, trying to quit  Vaping Use   Vaping Use: Never used  Substance and Sexual Activity   Alcohol use: Yes    Comment: occasional wine    Drug use: Never   Sexual activity: Yes    Birth control/protection: None    Comment: approx [redacted] wks gestation  Other Topics Concern   Not on file  Social History Narrative   Not on file   Social Determinants of Health   Financial Resource Strain: Not on file  Food Insecurity: No Food Insecurity (04/24/2020)   Hunger Vital Sign    Worried About Running Out of Food in the Last Year: Never true    Ran Out of Food in the Last Year: Never true  Transportation Needs: No Transportation Needs (04/24/2020)   PRAPARE - Hydrologist (Medical): No    Lack of Transportation (Non-Medical): No  Physical Activity: Not on file  Stress: Not on file  Social Connections: Not on file  Intimate Partner Violence: Not on file   Family History  Problem Relation Age of Onset   Hypertension Mother    Hypertension Father     OBJECTIVE:  Vitals:   12/10/21 1541  BP: 127/90  Pulse: 95  Temp: 98.7 F (37.1 C)  TempSrc: Oral  Weight: 165 lb 6.4 oz (75 kg)  Height: '5\' 9"'$  (1.753 m)     General appearance: Alert, NAD, appears stated age Head: NCAT Throat: lips, mucosa, and tongue normal; teeth and gums normal Lungs: CTA bilaterally without adventitious breath sounds Heart: regular rate and rhythm.  Radial pulses 2+ symmetrical bilaterally Back: no CVA tenderness Abdomen: soft,  non-tender; bowel sounds normal; no masses or organomegaly; no guarding or rebound tenderness Vaginal self-swab obtained. We will follow up with you regarding abnormal results Skin: warm and dry Psychological:  Alert and cooperative. Normal mood and affect.  LABS:  Results for orders placed or performed in visit on 12/10/21  HIV antibody (with reflex)  Result Value Ref Range   HIV Screen 4th Generation wRfx Non Reactive Non Reactive  Cervicovaginal ancillary only  Result Value Ref Range   Neisseria Gonorrhea Negative    Chlamydia Negative    Trichomonas Negative    Bacterial Vaginitis (gardnerella) Positive (A)    Candida Vaginitis Negative    Candida Glabrata Negative    Comment      Normal Reference Range Bacterial Vaginosis - Negative   Comment Normal Reference Range Candida Species - Negative    Comment Normal Reference Range Candida Galbrata - Negative    Comment Normal Reference Range Trichomonas - Negative    Comment Normal Reference Ranger Chlamydia - Negative    Comment      Normal Reference Range Neisseria Gonorrhea - Negative    ASSESSMENT & PLAN: Ann Bruce was seen today for hypertension.  Diagnoses and all orders for this visit:  Vaginal discharge -     Cervicovaginal ancillary only  Screening for HIV without presence of risk factors -     HIV antibody (with reflex)  Essential hypertension BP goal - < 130/80 Explained that having normal blood pressure is the goal and medications are helping to get to goal and maintain normal blood pressure. DIET: Limit salt intake, read nutrition labels to check salt content, limit fried and high fatty foods  Avoid using multisymptom OTC cold preparations that generally contain sudafed which can rise BP. Consult with pharmacist on best cold relief products to use for persons with HTN EXERCISE Discussed incorporating exercise such as walking - 30 minutes most days of the week and can do in 10 minute intervals    -     amLODipine  (NORVASC) 10 MG tablet; TAKE 1 TABLET(10 MG) BY MOUTH DAILY Meds ordered this encounter  Medications   amLODipine (NORVASC) 10 MG tablet    Sig: TAKE 1 TABLET(10 MG) BY MOUTH DAILY    Dispense:  90 tablet    Refill:  1    Order Specific Question:   Supervising Provider    AnswerTresa Garter [[id number]]   Juluis Mire, NP 12/13/2021 4:59 PM

## 2021-12-18 ENCOUNTER — Other Ambulatory Visit (INDEPENDENT_AMBULATORY_CARE_PROVIDER_SITE_OTHER): Payer: Self-pay

## 2021-12-18 DIAGNOSIS — I10 Essential (primary) hypertension: Secondary | ICD-10-CM

## 2021-12-18 MED ORDER — LOSARTAN POTASSIUM-HCTZ 100-25 MG PO TABS: 1.0000 | ORAL_TABLET | Freq: Every day | ORAL | 1 refills | Status: DC

## 2022-01-11 ENCOUNTER — Ambulatory Visit (HOSPITAL_COMMUNITY)
Admission: EM | Admit: 2022-01-11 | Discharge: 2022-01-11 | Disposition: A | Discharge status: Home / Self Care | Payer: Medicaid Other | Attending: Family Medicine | Admitting: Family Medicine

## 2022-01-11 ENCOUNTER — Encounter (HOSPITAL_COMMUNITY): Payer: Self-pay

## 2022-01-11 DIAGNOSIS — F419 Anxiety disorder, unspecified: Secondary | ICD-10-CM

## 2022-01-11 DIAGNOSIS — R0789 Other chest pain: Secondary | ICD-10-CM

## 2022-01-11 DIAGNOSIS — G479 Sleep disorder, unspecified: Secondary | ICD-10-CM

## 2022-01-11 MED ORDER — TRAZODONE HCL 50 MG PO TABS: 25.0000 mg | ORAL_TABLET | Freq: Every day | ORAL | 0 refills | Status: DC

## 2022-01-11 NOTE — ED Provider Notes (Signed)
[facility]    CSN: [id number] Arrival date & time: 01/11/22  1540      History   Chief Complaint Chief Complaint  Patient presents with   Chest Pain    HPI Ann Bruce is a 36 y.o. female.    Chest Pain  Here for chest pain, heart racing, and headache that began this morning.  She felt like she was going to pass out but did not.  Blood pressure here is 148/101; at her last primary care visit it was fairly well controlled.  Does take amlodipine and losartan.  Does admit to some stress, and she does not rest well.  No suicidal thoughts  Past Medical History:  Diagnosis Date   Dyspnea    with pregnancy - exertion   Hypertension    Ovarian cyst    UTI (urinary tract infection)     Patient Active Problem List   Diagnosis Date Noted   Post-operative state 04/24/2020   History of tubal ligation 04/24/2020   Chronic hypertension during pregnancy, antepartum 06/15/2019    Past Surgical History:  Procedure Laterality Date   APPENDECTOMY     LAPAROSCOPIC TUBAL LIGATION Bilateral 03/22/2020   Procedure: LAPAROSCOPIC TUBAL LIGATION WITH FILSHIE CLIPS;  Surgeon: [name], MD;  Location: [facility];  Service: Gynecology;  Laterality: Bilateral;   LAPAROTOMY Bilateral 08/04/2019   Procedure: EXPLORATORY LAPAROTOMY WITH BILATERAL OVARIAN CYSTECTOMY;  Surgeon: Osborne Oman, MD;  Location: [facility];  Service: Gynecology;  Laterality: Bilateral;  Dr. Harolyn Rutherford will check fetal heart tones before and after surgery   OVARIAN CYST SURGERY  2009   VAGINAL DELIVERY  2009, 2007   x 2    OB History     Gravida  3   Para  3   Term  2   Preterm  1   AB  0   Living  3      SAB  0   IAB  0   Ectopic  0   Multiple  0   Live Births  3            Home Medications    Prior to Admission medications   Medication Sig Start Date End Date Taking? Authorizing Provider  traZODone (DESYREL) 50 MG tablet Take 0.5-2 tablets (25-100  mg total) by mouth at bedtime. For sleep 01/11/22  Yes Barrett Henle, MD  amLODipine (NORVASC) 10 MG tablet TAKE 1 TABLET(10 MG) BY MOUTH DAILY 12/10/21   Kerin Perna, NP  atorvastatin (LIPITOR) 20 MG tablet Take 1 tablet (20 mg total) by mouth daily. 04/05/21   Kerin Perna, NP  losartan-hydrochlorothiazide (HYZAAR) 100-25 MG tablet Take 1 tablet by mouth daily. 12/18/21   Kerin Perna, NP  metroNIDAZOLE (FLAGYL) 500 MG tablet Take 1 tablet (500 mg total) by mouth 2 (two) times daily. 12/13/21   Kerin Perna, NP    Family History Family History  Problem Relation Age of Onset   Hypertension Mother    Hypertension Father     Social History Social History   Tobacco Use   Smoking status: Every Day    Packs/day: 0.25    Years: 15.00    Total pack years: 3.75    Types: Cigarettes   Smokeless tobacco: Never   Tobacco comments:    smokes every 3 days  or when stresses, trying to quit  Vaping Use   Vaping Use: Never used  Substance Use Topics  Alcohol use: Yes    Comment: occasional wine    Drug use: Never     Allergies   Patient has no known allergies.   Review of Systems Review of Systems  Cardiovascular:  Positive for chest pain.     Physical Exam Triage Vital Signs ED Triage Vitals  Enc Vitals Group     BP 01/11/22 1626 (!) 148/101     Pulse Rate 01/11/22 1626 93     Resp 01/11/22 1626 18     Temp 01/11/22 1626 98.5 F (36.9 C)     Temp Source 01/11/22 1626 Oral     SpO2 01/11/22 1626 97 %     Weight --      Height --      Head Circumference --      Peak Flow --      Pain Score 01/11/22 1627 6     Pain Loc --      Pain Edu? --      Excl. in [place]? --    No data found.  Updated Vital Signs BP (!) 148/101 (BP Location: Right Arm)   Pulse 93   Temp 98.5 F (36.9 C) (Oral)   Resp 18   LMP 12/18/2021   SpO2 97%   Visual Acuity Right Eye Distance:   Left Eye Distance:   Bilateral Distance:    Right Eye Near:   Left  Eye Near:    Bilateral Near:     Physical Exam Vitals reviewed.  Constitutional:      General: She is not in acute distress.    Appearance: She is not ill-appearing, toxic-appearing or diaphoretic.  HENT:     Mouth/Throat:     Mouth: Mucous membranes are moist.  Eyes:     Extraocular Movements: Extraocular movements intact.     Conjunctiva/sclera: Conjunctivae normal.     Pupils: Pupils are equal, round, and reactive to light.  Cardiovascular:     Rate and Rhythm: Normal rate and regular rhythm.     Heart sounds: No murmur heard. Pulmonary:     Effort: Pulmonary effort is normal. No respiratory distress.     Breath sounds: Normal breath sounds. No stridor. No wheezing, rhonchi or rales.  Skin:    Capillary Refill: Capillary refill takes less than 2 seconds.     Coloration: Skin is not jaundiced or pale.  Neurological:     General: No focal deficit present.     Mental Status: She is alert and oriented to person, place, and time.  Psychiatric:        Behavior: Behavior normal.      UC Treatments / Results  Labs (all labs ordered are listed, but only abnormal results are displayed) Labs Reviewed - No data to display  EKG   Radiology No results found.  Procedures Procedures (including critical care time)  Medications Ordered in UC Medications - No data to display  Initial Impression / Assessment and Plan / UC Course  I have reviewed the triage vital signs and the nursing notes.  Pertinent labs & imaging results that were available during my care of the patient were reviewed by me and considered in my medical decision making (see chart for details).     Her EKG is benign with normal sinus rhythm and no abnormalities.  In the exam room she is able to converse with complete sentences and is in no acute distress.  After discussing what is going on with her, we made the decision  to have her try some trazodone to help her rest.  She is going to follow-up with her  primary care.  I did ask her to check her blood pressure some at home also Final Clinical Impressions(s) / UC Diagnoses   Final diagnoses:  Atypical chest pain  Anxiety  Sleep disturbance     Discharge Instructions      Your EKG was normal  Trazodone 50 mg--start with 1/2 tablet or 25 mg at bedtime at least 8 hours before you want to get up in the morning.  You can increase the dose by 1/2 tablet nightly to have it help you sleep better.  If it causes hangover, nightmares, or headaches, do not continue to take it.  Your blood pressure at home some  Follow-up with your primary care     ED Prescriptions     Medication Sig Dispense Auth. Provider   traZODone (DESYREL) 50 MG tablet Take 0.5-2 tablets (25-100 mg total) by mouth at bedtime. For sleep 20 tablet Vern [place]ia, Gwenlyn Perking, MD      PDMP not reviewed this encounter.   Barrett Henle, MD 01/11/22 [phone]

## 2022-01-11 NOTE — ED Triage Notes (Signed)
Patient complains of feeling anxious and feels as if she is going to pass out. Patient does have a history of hypertension. Patient also states that it seems as if she is breathing really fast and denies feeling this way before. Also complains of chest discomfort.

## 2022-01-11 NOTE — Discharge Instructions (Addendum)
Your EKG was normal  Trazodone 50 mg--start with 1/2 tablet or 25 mg at bedtime at least 8 hours before you want to get up in the morning.  You can increase the dose by 1/2 tablet nightly to have it help you sleep better.  If it causes hangover, nightmares, or headaches, do not continue to take it.  Your blood pressure at home some  Follow-up with your primary care

## 2022-02-28 ENCOUNTER — Ambulatory Visit (INDEPENDENT_AMBULATORY_CARE_PROVIDER_SITE_OTHER): Payer: Self-pay

## 2022-02-28 NOTE — Telephone Encounter (Signed)
  Chief Complaint: Vaginal discharge - odor Symptoms: ibid Frequency: 2 days Pertinent Negatives: Patient denies  Disposition: '[]'$ ED /'[x]'$ Urgent Care (no appt availability in office) / '[]'$ Appointment(In office/virtual)/ '[]'$  [facility]/ '[]'$ Home Care/ '[]'$ Refused Recommended Disposition /'[]'$  Mobile Bus/ '[]'$  Follow-up with PCP Additional Notes: Pt noticed vaginal discharge and odor 2 days ago.  Pt also feels pressure with urination.  Summary: Pressure, discharge, vaginal buring advice   Pt is calling to report she is having pressure, discharge, and slight vaginal burning. Pt has an appt on 03/12/22. However is looking for something sooner. No available appts. Please advise      Reason for Disposition  Bad smelling vaginal discharge  Answer Assessment - Initial Assessment Questions 1. DISCHARGE: "Describe the discharge." (e.g., white, yellow, green, gray, foamy, cottage cheese-like)     Cloudy, white 2. ODOR: "Is there a bad odor?"     Smell  3. ONSET: "When did the discharge begin?"     2 days 4. RASH: "Is there a rash in the genital area?" If Yes, ask: "Describe it." (e.g., redness, blisters, sores, bumps)     no 5. ABDOMEN PAIN: "Are you having any abdomen pain?" If Yes, ask: "What does it feel like? " (e.g., crampy, dull, intermittent, constant)      No 6. ABDOMEN PAIN SEVERITY: If present, ask: "How bad is it?" (e.g., Scale 1-10; mild, moderate, or severe)   - MILD (1-3): Doesn't interfere with normal activities, abdomen soft and not tender to touch.    - MODERATE (4-7): Interferes with normal activities or awakens from sleep, abdomen tender to touch.    - SEVERE (8-10): Excruciating pain, doubled over, unable to do any normal activities. (R/O peritonitis)      Pressure with urination 7. CAUSE: "What do you think is causing the discharge?" "Have you had the same problem before? What happened then?"     unsure 8. OTHER SYMPTOMS: "Do you have any other symptoms?" (e.g.,  fever, itching, vaginal bleeding, pain with urination, injury to genital area, vaginal foreign body)     Pressure 9. PREGNANCY: "Is there any chance you are pregnant?" "When was your last menstrual period?"     no  Protocols used: Vaginal Discharge-A-AH

## 2022-02-28 NOTE — Telephone Encounter (Signed)
Will forward to provider  

## 2022-03-01 NOTE — Telephone Encounter (Signed)
Please contact pt and schedule a nurse visit for Monday

## 2022-03-01 NOTE — Telephone Encounter (Signed)
Please

## 2022-03-04 ENCOUNTER — Ambulatory Visit (INDEPENDENT_AMBULATORY_CARE_PROVIDER_SITE_OTHER): Payer: Medicaid Other

## 2022-03-04 ENCOUNTER — Encounter (INDEPENDENT_AMBULATORY_CARE_PROVIDER_SITE_OTHER): Payer: Self-pay

## 2022-03-05 NOTE — Progress Notes (Signed)
Pt came in for a vaginal swab but pt was on menstrual per provider will need to come back after menstrual has stopped for accurate testing

## 2022-03-08 ENCOUNTER — Other Ambulatory Visit (HOSPITAL_COMMUNITY)
Admission: RE | Admit: 2022-03-08 | Discharge: 2022-03-08 | Disposition: A | Discharge status: Home / Self Care | Payer: Medicaid Other | Source: Ambulatory Visit | Attending: Primary Care | Admitting: Primary Care

## 2022-03-08 ENCOUNTER — Ambulatory Visit (INDEPENDENT_AMBULATORY_CARE_PROVIDER_SITE_OTHER): Payer: Medicaid Other

## 2022-03-08 DIAGNOSIS — N898 Other specified noninflammatory disorders of vagina: Secondary | ICD-10-CM

## 2022-03-12 ENCOUNTER — Ambulatory Visit (INDEPENDENT_AMBULATORY_CARE_PROVIDER_SITE_OTHER): Payer: Medicaid Other | Admitting: Primary Care

## 2022-03-12 LAB — CERVICOVAGINAL ANCILLARY ONLY
Bacterial Vaginitis (gardnerella): NEGATIVE
Candida Glabrata: NEGATIVE
Candida Vaginitis: NEGATIVE
Chlamydia: NEGATIVE
Comment: NEGATIVE
Comment: NEGATIVE
Comment: NEGATIVE
Comment: NEGATIVE
Comment: NEGATIVE
Comment: NORMAL
Neisseria Gonorrhea: NEGATIVE
Trichomonas: NEGATIVE

## 2022-03-13 ENCOUNTER — Encounter (INDEPENDENT_AMBULATORY_CARE_PROVIDER_SITE_OTHER): Payer: Self-pay | Admitting: Primary Care

## 2022-03-15 ENCOUNTER — Ambulatory Visit (INDEPENDENT_AMBULATORY_CARE_PROVIDER_SITE_OTHER): Payer: Medicaid Other

## 2022-03-15 DIAGNOSIS — R300 Dysuria: Secondary | ICD-10-CM

## 2022-03-15 LAB — POCT URINALYSIS DIP (CLINITEK)
Bilirubin, UA: NEGATIVE
Glucose, UA: NEGATIVE mg/dL
Ketones, POC UA: NEGATIVE mg/dL
Leukocytes, UA: NEGATIVE
Nitrite, UA: POSITIVE — AB
POC PROTEIN,UA: NEGATIVE
Spec Grav, UA: >=1.03 — AB (ref 1.010–1.025)
Urobilinogen, UA: 1 E.U./dL
pH, UA: 5.5 (ref 5.0–8.0)

## 2022-03-15 NOTE — Addendum Note (Signed)
Addended by: Verdell Carmine on: 03/15/2022 12:38 PM   Modules accepted: Orders

## 2022-03-19 ENCOUNTER — Other Ambulatory Visit (INDEPENDENT_AMBULATORY_CARE_PROVIDER_SITE_OTHER): Payer: Self-pay | Admitting: Primary Care

## 2022-03-19 DIAGNOSIS — N390 Urinary tract infection, site not specified: Secondary | ICD-10-CM

## 2022-03-19 LAB — URINE CULTURE

## 2022-03-19 MED ORDER — FLUCONAZOLE 150 MG PO TABS: 150.0000 mg | ORAL_TABLET | Freq: Every day | ORAL | 1 refills | Status: DC

## 2022-03-19 MED ORDER — NITROFURANTOIN MONOHYD MACRO 100 MG PO CAPS: 100.0000 mg | ORAL_CAPSULE | Freq: Two times a day (BID) | ORAL | 0 refills | Status: DC

## 2022-04-03 ENCOUNTER — Ambulatory Visit (INDEPENDENT_AMBULATORY_CARE_PROVIDER_SITE_OTHER): Payer: Medicaid Other | Admitting: Primary Care

## 2022-04-03 ENCOUNTER — Encounter (INDEPENDENT_AMBULATORY_CARE_PROVIDER_SITE_OTHER): Payer: Self-pay | Admitting: Primary Care

## 2022-04-03 VITALS — BP 135/86 | HR 102 | Resp 16 | Wt 166.4 lb

## 2022-04-03 DIAGNOSIS — E782 Mixed hyperlipidemia: Secondary | ICD-10-CM

## 2022-04-03 DIAGNOSIS — G4700 Insomnia, unspecified: Secondary | ICD-10-CM

## 2022-04-03 DIAGNOSIS — N83201 Unspecified ovarian cyst, right side: Secondary | ICD-10-CM

## 2022-04-03 DIAGNOSIS — I10 Essential (primary) hypertension: Secondary | ICD-10-CM | POA: Diagnosis not present

## 2022-04-03 DIAGNOSIS — N83202 Unspecified ovarian cyst, left side: Secondary | ICD-10-CM

## 2022-04-03 DIAGNOSIS — Z760 Encounter for issue of repeat prescription: Secondary | ICD-10-CM

## 2022-04-03 MED ORDER — TRAZODONE HCL 50 MG PO TABS: 25.0000 mg | ORAL_TABLET | Freq: Every day | ORAL | 1 refills | Status: DC

## 2022-04-03 MED ORDER — AMLODIPINE BESYLATE 10 MG PO TABS: ORAL_TABLET | ORAL | 1 refills | Status: DC

## 2022-04-03 MED ORDER — LOSARTAN POTASSIUM-HCTZ 100-25 MG PO TABS: 1.0000 | ORAL_TABLET | Freq: Every day | ORAL | 1 refills | Status: DC

## 2022-04-03 MED ORDER — ATORVASTATIN CALCIUM 20 MG PO TABS: 20.0000 mg | ORAL_TABLET | Freq: Every day | ORAL | 1 refills | Status: DC

## 2022-04-03 NOTE — Progress Notes (Signed)
Ann Bruce is a 36 y.o. female presents for hypertension evaluation, Denies shortness of breath, headaches, chest pain or lower extremity edema, sudden onset, vision changes, unilateral weakness, dizziness, paresthesias   Patient reports adherence with medications.  Dietary habits include: monitoring sodium intake  Exercise habits include:yes Ann Bruce  Family / Social history: GM fraternal CVA    Past Medical History:  Diagnosis Date   Dyspnea    with pregnancy - exertion   Hypertension    Ovarian cyst    UTI (urinary tract infection)    Past Surgical History:  Procedure Laterality Date   APPENDECTOMY     LAPAROSCOPIC TUBAL LIGATION Bilateral 03/22/2020   Procedure: LAPAROSCOPIC TUBAL LIGATION WITH FILSHIE CLIPS;  Surgeon: [name], MD;  Location: [facility];  Service: Gynecology;  Laterality: Bilateral;   LAPAROTOMY Bilateral 08/04/2019   Procedure: EXPLORATORY LAPAROTOMY WITH BILATERAL OVARIAN CYSTECTOMY;  Surgeon: Osborne Oman, MD;  Location: [facility];  Service: Gynecology;  Laterality: Bilateral;  Dr. Harolyn Rutherford will check fetal heart tones before and after surgery   OVARIAN CYST SURGERY  2009   VAGINAL DELIVERY  2009, 2007   x 2   No Known Allergies Current Outpatient Medications on File Prior to Visit  Medication Sig Dispense Refill   fluconazole (DIFLUCAN) 150 MG tablet Take 1 tablet (150 mg total) by mouth daily. (Patient not taking: Reported on 04/03/2022) 1 tablet 1   metroNIDAZOLE (FLAGYL) 500 MG tablet Take 1 tablet (500 mg total) by mouth 2 (two) times daily. (Patient not taking: Reported on 04/03/2022) 14 tablet 0   nitrofurantoin, macrocrystal-monohydrate, (MACROBID) 100 MG capsule Take 1 capsule (100 mg total) by mouth 2 (two) times daily. (Patient not taking: Reported on 04/03/2022) 20 capsule 0   No current facility-administered medications on file prior to visit.   Social History   Socioeconomic History    Marital status: Single    Spouse name: Not on file   Number of children: 2   Years of education: Not on file   Highest education level: Some college, no degree  Occupational History   Not on file  Tobacco Use   Smoking status: Every Day    Packs/day: 0.25    Years: 15.00    Total pack years: 3.75    Types: Cigarettes   Smokeless tobacco: Never   Tobacco comments:    smokes every 3 days  or when stresses, trying to quit  Vaping Use   Vaping Use: Never used  Substance and Sexual Activity   Alcohol use: Yes    Comment: occasional wine    Drug use: Never   Sexual activity: Yes    Birth control/protection: None    Comment: approx [redacted] wks gestation  Other Topics Concern   Not on file  Social History Narrative   Not on file   Social Determinants of Health   Financial Resource Strain: Not on file  Food Insecurity: No Food Insecurity (04/24/2020)   Hunger Vital Sign    Worried About Running Out of Food in the Last Year: Never true    Ran Out of Food in the Last Year: Never true  Transportation Needs: No Transportation Needs (04/24/2020)   PRAPARE - Hydrologist (Medical): No    Lack of Transportation (Non-Medical): No  Physical Activity: Not on file  Stress: Not on file  Social Connections: Not on file  Intimate Partner Violence: Not on file  Family History  Problem Relation Age of Onset   Hypertension Mother    Hypertension Father      OBJECTIVE:  Vitals:   04/03/22 1600  BP: 135/86  Pulse: (!) 102  Resp: 16  SpO2: 97%  Weight: 166 lb 6.4 oz (75.5 kg)   Physical exam: General: Vital signs reviewed.  Patient is well-developed and well-nourished, normal weight  in no acute distress and cooperative with exam. Head: Normocephalic and atraumatic. Eyes: EOMI, conjunctivae normal, no scleral icterus. Neck: Supple, trachea midline, normal ROM, no JVD, masses, thyromegaly, or carotid bruit present. Cardiovascular: RRR, S1 normal, S2  normal, no murmurs, gallops, or rubs. Pulmonary/Chest: Clear to auscultation bilaterally, no wheezes, rales, or rhonchi. Abdominal: Soft, non-tender, non-distended, BS +, no masses, organomegaly, or guarding present. Musculoskeletal: No joint deformities, erythema, or stiffness, ROM full and nontender. Extremities: No lower extremity edema bilaterally,  pulses symmetric and intact bilaterally. No cyanosis or clubbing. Neurological: A&O x3, Strength is normal Skin: Warm, dry and intact. No rashes or erythema. Psychiatric: Normal mood and affect. speech and behavior is normal. Cognition and memory are normal.     ROS Comprehensive ROS Pertinent positive and negative noted in HPI   Last 3 Office BP readings: BP Readings from Last 3 Encounters:  04/03/22 135/86  01/11/22 (!) 148/101  12/10/21 127/90    BMET    Component Value Date/Time   NA 141 04/02/2021 0853   K 3.8 04/02/2021 0853   CL 102 04/02/2021 0853   CO2 26 04/02/2021 0853   GLUCOSE 90 04/02/2021 0853   GLUCOSE 147 (H) 12/24/2019 1224   BUN 10 04/02/2021 0853   CREATININE 0.72 04/02/2021 0853   CALCIUM 9.1 04/02/2021 0853   GFRNONAA >60 12/24/2019 1224   GFRAA >60 12/24/2019 1224    Renal function: CrCl cannot be calculated (Patient's most recent lab result is older than the maximum 21 days allowed.).  Clinical ASCVD: No  The ASCVD Risk score (Arnett DK, et al., 2019) failed to calculate for the following reasons:   The 2019 ASCVD risk score is only valid for ages 25 to 8  ASCVD risk factors include- Ann Bruce   ASSESSMENT & PLAN:  Meds ordered this encounter  Medications   DISCONTD: losartan-hydrochlorothiazide (HYZAAR) 100-25 MG tablet    Sig: Take 1 tablet by mouth daily.    Dispense:  90 tablet    Refill:  1    Order Specific Question:   Supervising Provider    Answer:   Tresa Garter [[id number]]   DISCONTD: amLODipine (NORVASC) 10 MG tablet    Sig: TAKE 1 TABLET(10 MG) BY MOUTH DAILY    Dispense:   90 tablet    Refill:  1    Order Specific Question:   Supervising Provider    Answer:   Tresa Garter [[id number]]   traZODone (DESYREL) 50 MG tablet    Sig: Take 0.5-2 tablets (25-100 mg total) by mouth at bedtime. For sleep    Dispense:  90 tablet    Refill:  1    Order Specific Question:   Supervising Provider    Answer:   Tresa Garter [[id number]]   losartan-hydrochlorothiazide (HYZAAR) 100-25 MG tablet    Sig: Take 1 tablet by mouth daily.    Dispense:  90 tablet    Refill:  1    Order Specific Question:   Supervising Provider    Answer:   Tresa Garter [[id number]]   amLODipine (NORVASC) 10 MG tablet  Sig: TAKE 1 TABLET(10 MG) BY MOUTH DAILY    Dispense:  90 tablet    Refill:  1    Order Specific Question:   Supervising Provider    Answer:   Tresa Garter [[id number]]   atorvastatin (LIPITOR) 20 MG tablet    Sig: Take 1 tablet (20 mg total) by mouth daily.    Dispense:  90 tablet    Refill:  1    Order Specific Question:   Supervising Provider    Answer:   Tresa Garter [[id number]]   Lyric was seen today for hypertension.  Diagnoses and all orders for this visit:  Insomnia, unspecified type -     traZODone (DESYREL) 50 MG tablet; Take 0.5-2 tablets (25-100 mg total) by mouth at bedtime. For sleep  Mixed hyperlipidemia  Healthy lifestyle diet of fruits vegetables fish nuts whole grains and low saturated fat . Foods high in cholesterol or liver, fatty meats,cheese, butter avocados, nuts and seeds, chocolate and fried foods.  -     atorvastatin (LIPITOR) 20 MG tablet; Take 1 tablet (20 mg total) by mouth daily.   Essential hypertension -Counseled on lifestyle modifications for blood pressure control including reduced dietary sodium, increased exercise, weight reduction and adequate sleep. Also, educated patient about the risk for cardiovascular events, stroke and heart attack. Also counseled patient about the importance of medication adherence. If  you participate in smoking, it is important to stop using tobacco as this will increase the risks associated with uncontrolled blood pressure.   Goal BP:  For patients younger than 60: Goal BP < 130/80. For patients 60 and older: Goal BP < 140/90. For patients with diabetes: Goal BP < 130/80. Your most recent BP: 135/86  Minimize salt intake. Minimize alcohol intake   losartan-hydrochlorothiazide (HYZAAR) 100-25 MG tablet; Take 1 tablet by mouth daily. -     amLODipine (NORVASC) 10 MG tablet; TAKE 1 TABLET(10 MG) BY MOUTH DAILY  Ovarian cyst, bilateral -     Ambulatory referral to Gynecology   Medication refill -     traZODone (DESYREL) 50 MG tablet; Take 0.5-2 tablets (25-100 mg total) by mouth at bedtime. For sleep -     losartan-hydrochlorothiazide (HYZAAR) 100-25 MG tablet; Take 1 tablet by mouth daily. -     amLODipine (NORVASC) 10 MG tablet; TAKE 1 TABLET(10 MG) BY MOUTH DAILY -     atorvastatin (LIPITOR) 20 MG tablet; Take 1 tablet (20 mg total) by mouth daily.    This note has been created with Surveyor, quantity. Any transcriptional errors are unintentional.   Kerin Perna, NP 04/03/2022, 4:24 PM

## 2022-04-10 ENCOUNTER — Encounter (HOSPITAL_COMMUNITY): Payer: Self-pay | Admitting: Obstetrics and Gynecology

## 2022-04-10 ENCOUNTER — Encounter (HOSPITAL_COMMUNITY): Payer: Self-pay

## 2022-04-10 ENCOUNTER — Inpatient Hospital Stay (HOSPITAL_COMMUNITY)
Admission: AD | Admit: 2022-04-10 | Discharge: 2022-04-10 | Disposition: A | Discharge status: Home / Self Care | Payer: Medicaid Other | Attending: Obstetrics and Gynecology | Admitting: Obstetrics and Gynecology

## 2022-04-10 ENCOUNTER — Inpatient Hospital Stay (HOSPITAL_COMMUNITY): Payer: Medicaid Other

## 2022-04-10 ENCOUNTER — Other Ambulatory Visit: Payer: Self-pay

## 2022-04-10 ENCOUNTER — Ambulatory Visit (HOSPITAL_COMMUNITY)
Admission: EM | Admit: 2022-04-10 | Discharge: 2022-04-10 | Disposition: A | Discharge status: Home / Self Care | Payer: Medicaid Other | Attending: Family Medicine | Admitting: Family Medicine

## 2022-04-10 ENCOUNTER — Ambulatory Visit (INDEPENDENT_AMBULATORY_CARE_PROVIDER_SITE_OTHER): Payer: Self-pay | Admitting: *Deleted

## 2022-04-10 DIAGNOSIS — O99891 Other specified diseases and conditions complicating pregnancy: Secondary | ICD-10-CM | POA: Insufficient documentation

## 2022-04-10 DIAGNOSIS — Z3201 Encounter for pregnancy test, result positive: Secondary | ICD-10-CM | POA: Diagnosis not present

## 2022-04-10 DIAGNOSIS — O26891 Other specified pregnancy related conditions, first trimester: Secondary | ICD-10-CM | POA: Diagnosis not present

## 2022-04-10 DIAGNOSIS — M545 Low back pain, unspecified: Secondary | ICD-10-CM | POA: Insufficient documentation

## 2022-04-10 DIAGNOSIS — Z3A01 Less than 8 weeks gestation of pregnancy: Secondary | ICD-10-CM

## 2022-04-10 DIAGNOSIS — O09521 Supervision of elderly multigravida, first trimester: Secondary | ICD-10-CM | POA: Diagnosis not present

## 2022-04-10 DIAGNOSIS — O99331 Smoking (tobacco) complicating pregnancy, first trimester: Secondary | ICD-10-CM | POA: Diagnosis not present

## 2022-04-10 DIAGNOSIS — O161 Unspecified maternal hypertension, first trimester: Secondary | ICD-10-CM

## 2022-04-10 DIAGNOSIS — R109 Unspecified abdominal pain: Secondary | ICD-10-CM | POA: Diagnosis not present

## 2022-04-10 DIAGNOSIS — Z3490 Encounter for supervision of normal pregnancy, unspecified, unspecified trimester: Secondary | ICD-10-CM

## 2022-04-10 LAB — POC URINE PREG, ED: Preg Test, Ur: POSITIVE — AB

## 2022-04-10 LAB — POCT URINALYSIS DIPSTICK, ED / UC
Glucose, UA: NEGATIVE mg/dL
Ketones, ur: NEGATIVE mg/dL
Leukocytes,Ua: NEGATIVE
Nitrite: NEGATIVE
Protein, ur: NEGATIVE mg/dL
Specific Gravity, Urine: 1.025 (ref 1.005–1.030)
Urobilinogen, UA: 1 mg/dL (ref 0.0–1.0)
pH: 6 (ref 5.0–8.0)

## 2022-04-10 LAB — CBC
HCT: 37.1 % (ref 36.0–46.0)
Hemoglobin: 13 g/dL (ref 12.0–15.0)
MCH: 31.5 pg (ref 26.0–34.0)
MCHC: 35 g/dL (ref 30.0–36.0)
MCV: 89.8 fL (ref 80.0–100.0)
Platelets: 186 10*3/uL (ref 150–400)
RBC: 4.13 MIL/uL (ref 3.87–5.11)
RDW: 13.3 % (ref 11.5–15.5)
WBC: 5 10*3/uL (ref 4.0–10.5)
nRBC: 0 % (ref 0.0–0.2)

## 2022-04-10 LAB — WET PREP, GENITAL
Clue Cells Wet Prep HPF POC: NONE SEEN
Sperm: NONE SEEN
Trich, Wet Prep: NONE SEEN
WBC, Wet Prep HPF POC: <10 (ref <10)
Yeast Wet Prep HPF POC: NONE SEEN

## 2022-04-10 LAB — HCG, QUANTITATIVE, PREGNANCY: hCG, Beta Chain, Quant, S: 11606 m[IU]/mL — ABNORMAL HIGH (ref <5)

## 2022-04-10 LAB — HIV ANTIBODY (ROUTINE TESTING W REFLEX): HIV Screen 4th Generation wRfx: NONREACTIVE

## 2022-04-10 NOTE — ED Provider Notes (Signed)
[facility]   [id number] 04/10/22 Arrival Time: 1010  ASSESSMENT & PLAN:  1. Acute midline low back pain without sciatica   2. Positive pregnancy test    Benign abdominal exam though with reported significant low back pain. Positive UPT. Cannot r/o ectopic. Sent to MAU via POV; stable upon discharge.   Follow-up Information     Go to  [facility] (MAU).   Contact information: [address],  [place]  [phone]                Reviewed expectations re: course of current medical issues. Questions answered. Outlined signs and symptoms indicating need for more acute intervention. Patient verbalized understanding. After Visit Summary given.   SUBJECTIVE: History from: patient. Ann Bruce is a 36 y.o. female who presents with complaint of midline LBP; gradual onset; sev days. No specific abd pain. No n/v/d. Normal PO intake. Ambulatory without difficulty. No urinary symptoms. Afebrile. Patient's last menstrual period was 03/02/2022. Late for menstrual period. Is sexually active.  Past Surgical History:  Procedure Laterality Date   APPENDECTOMY     LAPAROSCOPIC TUBAL LIGATION Bilateral 03/22/2020   Procedure: LAPAROSCOPIC TUBAL LIGATION WITH FILSHIE CLIPS;  Surgeon: [name], MD;  Location: [facility];  Service: Gynecology;  Laterality: Bilateral;   LAPAROTOMY Bilateral 08/04/2019   Procedure: EXPLORATORY LAPAROTOMY WITH BILATERAL OVARIAN CYSTECTOMY;  Surgeon: Osborne Oman, MD;  Location: [facility];  Service: Gynecology;  Laterality: Bilateral;  Dr. Harolyn Rutherford will check fetal heart tones before and after surgery   OVARIAN CYST SURGERY  2009   OBJECTIVE:  Vitals:   04/10/22 1152  BP: (!) 147/100  Pulse: 71  Resp: 18  Temp: 99 F (37.2 C)  TempSrc: Oral  SpO2: 99%    General appearance: alert, oriented, no acute distress HEENT: [place]; AT; oropharynx moist Lungs: unlabored  respirations Abdomen: soft; without distention; no specific tenderness to palpation; normal bowel sounds; without masses or organomegaly; without guarding or rebound tenderness Back: without reported CVA tenderness; FROM at waist Extremities: without LE edema; symmetrical; without gross deformities Skin: warm and dry Neurologic: normal gait Psychological: alert and cooperative; normal mood and affect  Labs: Results for orders placed or performed during the hospital encounter of 04/10/22  POC Urinalysis dipstick  Result Value Ref Range   Glucose, UA NEGATIVE NEGATIVE mg/dL   Bilirubin Urine SMALL (A) NEGATIVE   Ketones, ur NEGATIVE NEGATIVE mg/dL   Specific Gravity, Urine 1.025 1.005 - 1.030   Hgb urine dipstick TRACE (A) NEGATIVE   pH 6.0 5.0 - 8.0   Protein, ur NEGATIVE NEGATIVE mg/dL   Urobilinogen, UA 1.0 0.0 - 1.0 mg/dL   Nitrite NEGATIVE NEGATIVE   Leukocytes,Ua NEGATIVE NEGATIVE  POC urine pregnancy  Result Value Ref Range   Preg Test, Ur POSITIVE (A) NEGATIVE   Labs Reviewed  POCT URINALYSIS DIPSTICK, ED / UC - Abnormal; Notable for the following components:      Result Value   Bilirubin Urine SMALL (*)    Hgb urine dipstick TRACE (*)    All other components within normal limits  POC URINE PREG, ED - Abnormal; Notable for the following components:   Preg Test, Ur POSITIVE (*)    All other components within normal limits     No Known Allergies  Past Medical History:  Diagnosis Date   Dyspnea    with pregnancy - exertion   Hypertension    Ovarian cyst    UTI (urinary tract infection)     Social History   Socioeconomic History   Marital status: Single    Spouse name: Not on file   Number of children: 2   Years of education: Not on file   Highest education level: Some college, no degree  Occupational History   Not on file  Tobacco Use   Smoking status: Every Day    Packs/day: 0.25    Years: 15.00    Total  pack years: 3.75    Types: Cigarettes   Smokeless tobacco: Never   Tobacco comments:    smokes every 3 days  or when stresses, trying to quit  Vaping Use   Vaping Use: Never used  Substance and Sexual Activity   Alcohol use: Yes    Comment: occasional wine    Drug use: Never   Sexual activity: Yes    Birth control/protection: Surgical    Comment: approx [redacted] wks gestation  Other Topics Concern   Not on file  Social History Narrative   Not on file   Social Determinants of Health   Financial Resource Strain: Not on file  Food Insecurity: No Food Insecurity (04/24/2020)   Hunger Vital Sign    Worried About Running Out of Food in the Last Year: Never true    Ran Out of Food in the Last Year: Never true  Transportation Needs: No Transportation Needs (04/24/2020)   PRAPARE - Hydrologist (Medical): No    Lack of Transportation (Non-Medical): No  Physical Activity: Not on file  Stress: Not on file  Social Connections: Not on file  Intimate Partner Violence: Not on file    Family History  Problem Relation Age of Onset   Hypertension Mother    Breast cancer Mother    Hypertension Father      Vanessa Kick, MD 04/10/22 1329

## 2022-04-10 NOTE — MAU Note (Signed)
Patient signed physical copy of AVS. Placed in medical records bin.

## 2022-04-10 NOTE — ED Notes (Signed)
Patient is being discharged from the Urgent Care and sent to the Emergency Department MAU via personal vehicle. Per Dr Mannie Stabile, patient is in need of higher level of care due to positive pregnancy with pain. Patient is aware and verbalizes understanding of plan of care.  Vitals:   04/10/22 1152  BP: (!) 147/100  Pulse: 71  Resp: 18  Temp: 99 F (37.2 C)  SpO2: 99%

## 2022-04-10 NOTE — Telephone Encounter (Signed)
Message from [employer] sent at 04/10/2022  8:34 AM EST  Summary: Menstrual concerns   The patient has not had their cycle in roughly 10 days and is experiencing back discomfort  The patient would like to speak with a member of clinical staff regarding their concerns  Please contact the patient further when possible          Call History   Type Contact Phone/Fax User  04/10/2022 08:33 AM EST Phone (Incoming) Bruce, Ann Ishii (Self) [phone] Jerilynn Mages) Coley, Everette A   Reason for Disposition  [1] Missed period has occurred 2 or more times in the last year AND [2] cause is not known    Period 10 days late   Tubes are tied.   Periods are regular  Answer Assessment - Initial Assessment Questions 1. LMP:  "When did your last menstrual period begin?"     Not had a period for 10 days.   Not done a pregnancy test yet.    I got my tubes tied 2 yrs ago.     2. DAYS LATE: "How many days late is your menstrual period?"     10 days late  3. REGULARITY: "How regular are your periods?"     Very regular 4. PREGNANCY: "Is there any chance you are pregnant?" (e.g., unprotected intercourse, missed birth control pill, broken condom) "Have you used a home pregnancy test?"     Tubes are tied. 5. BREASTFEEDING: "Are you breastfeeding?"     No 6. BIRTH CONTROL PILLS: "Are you taking birth control pills, or have you stopped recently?"     No 7. LONG-ACTING CONTRACEPTION: "Has your doctor given you a shot to prevent pregnancy?" (e.g., Depo-Provera injection) "Do you have an intrauterine device (IUD)".     No 8. CAUSE: "What do you think caused the missed period?" (e.g., stress, rapid weight loss, excessive exercise)     I don't know 9. OTHER SYMPTOMS: "Do you have any other symptoms?" (e.g., abdomen pain)     I have middle lower back pain.   No fever.  Protocols used: Menstrual Period - Missed or Late-A-AH

## 2022-04-10 NOTE — MAU Note (Signed)
Ann Bruce is a 36 y.o. at [id number] in MAU reporting: she was sent from UC d/t having +UPT.  Reports went to UC secondary c/o lower back pain and late cycle.  States feels like "bones are rubbing together".  States pain is constant.  Denies taking meds to treat pain. S/P BTL 2 years ago LMP: 03/02/22 Onset of complaint: 3 days ago Pain score: 7 Vitals:   04/10/22 1305  BP: 135/86  Pulse: 87  Resp: 18  Temp: 98.6 F (37 C)  SpO2: 100%     FHT:NA Lab orders placed from triage:   None

## 2022-04-10 NOTE — Discharge Instructions (Signed)
You will need a blood pressure check in 1 week.

## 2022-04-10 NOTE — Telephone Encounter (Signed)
  Chief Complaint: Period is 10 days late and having middle lower back pain Symptoms: No period for 10 days.   Usually very regular.   Has tubes tied so has not done a pregnancy test but agreeable to doing one. Frequency: 10 days now Pertinent Negatives: Patient denies any urinary symptoms or vaginal irritation or issues. Disposition: '[]'$ ED /'[]'$ Urgent Care (no appt availability in office) / '[x]'$ Appointment(In office/virtual)/ '[]'$  [facility]/ '[]'$ Home Care/ '[]'$ Refused Recommended Disposition /'[]'$ [facility]/ '[]'$  Follow-up with PCP Additional Notes: Made her an appt. With [facility] for 05/02/2022 at 9:50.

## 2022-04-10 NOTE — MAU Provider Note (Signed)
History     CSN: [id number]  Arrival date and time: 04/10/22 1250   Event Date/Time   First Provider Initiated Contact with Patient 04/10/22 1328      Chief Complaint  Patient presents with   Back Pain   Ann Bruce is a 36 y.o. year old [id number] female at 69w4dweeks gestation who was sent to MAU from Urgent Care reporting lower back pain and late menstrual cycle. [facility] She describes the pain in her lower back as feeling like "bones are rubbing together" and constant. She has not done anything to relieve the pain. She reports the pain has been on-going for 3 days. Her pregnancy is complicated by h/o BTL in 2020, cHTN and AMA. She has not established PNC at this time.  OB History     Gravida  4   Para  3   Term  2   Preterm  1   AB  0   Living  3      SAB  0   IAB  0   Ectopic  0   Multiple  0   Live Births  3           Past Medical History:  Diagnosis Date   Dyspnea    with pregnancy - exertion   Hypertension    Ovarian cyst    UTI (urinary tract infection)     Past Surgical History:  Procedure Laterality Date   APPENDECTOMY     LAPAROSCOPIC TUBAL LIGATION Bilateral 03/22/2020   Procedure: LAPAROSCOPIC TUBAL LIGATION WITH FILSHIE CLIPS;  Surgeon: EChancy Milroy MD;  [place]  Service: Gynecology;  Laterality: Bilateral;   LAPAROTOMY Bilateral 08/04/2019   Procedure: EXPLORATORY LAPAROTOMY WITH BILATERAL OVARIAN CYSTECTOMY;  Surgeon: AOsborne Oman MD;  [place]  Service: Gynecology;  Laterality: Bilateral;  Dr. AHarolyn Rutherfordwill check fetal heart tones before and after surgery   OVARIAN CYST SURGERY  2009    Family History  Problem Relation Age of Onset   Hypertension Mother    Breast cancer Mother    Hypertension Father     Social History   Tobacco Use   Smoking status: Every Day    Packs/day: 0.25    Years: 15.00    Total pack years: 3.75    Types:  Cigarettes   Smokeless tobacco: Never   Tobacco comments:    smokes every 3 days  or when stresses, trying to quit  Vaping Use   Vaping Use: Never used  Substance Use Topics   Alcohol use: Yes    Comment: occasional wine    Drug use: Never    Allergies: No Known Allergies  Medications Prior to Admission  Medication Sig Dispense Refill Last Dose   amLODipine (NORVASC) 10 MG tablet TAKE 1 TABLET(10 MG) BY MOUTH DAILY 90 tablet 1 04/10/2022   atorvastatin (LIPITOR) 20 MG tablet Take 1 tablet (20 mg total) by mouth daily. 90 tablet 1 04/10/2022   losartan-hydrochlorothiazide (HYZAAR) 100-25 MG tablet Take 1 tablet by mouth daily. 90 tablet 1 Past Week   traZODone (DESYREL) 50 MG tablet Take 0.5-2 tablets (25-100 mg total) by mouth at bedtime. For sleep 90 tablet 1 04/09/2022   fluconazole (DIFLUCAN) 150 MG tablet Take 1 tablet (150 mg total) by mouth daily. (Patient not taking: Reported on 04/03/2022) 1 tablet 1    metroNIDAZOLE (FLAGYL) 500 MG tablet Take 1 tablet (  500 mg total) by mouth 2 (two) times daily. (Patient not taking: Reported on 04/03/2022) 14 tablet 0    nitrofurantoin, macrocrystal-monohydrate, (MACROBID) 100 MG capsule Take 1 capsule (100 mg total) by mouth 2 (two) times daily. (Patient not taking: Reported on 04/03/2022) 20 capsule 0     Review of Systems  Constitutional: Negative.   HENT: Negative.    Eyes: Negative.   Respiratory: Negative.    Cardiovascular: Negative.   Gastrointestinal: Negative.   Endocrine: Negative.   Genitourinary:  Positive for pelvic pain (R>L).  Musculoskeletal:  Positive for back pain (lower).  Skin: Negative.   Allergic/Immunologic: Negative.   Neurological: Negative.   Hematological: Negative.   Psychiatric/Behavioral: Negative.     Physical Exam   Blood pressure 129/76, pulse 84, temperature 98.6 F (37 C), resp. rate 18, height '5\' 8"'$  (1.727 m), weight 77.4 kg, last menstrual period 03/02/2022, SpO2 100 %.  Physical Exam Vitals  and nursing note reviewed. Exam conducted with a chaperone present.  Constitutional:      Appearance: Normal appearance. She is normal weight.  Pulmonary:     Effort: Pulmonary effort is normal.  Abdominal:     General: Abdomen is flat.     Palpations: Abdomen is soft.  Genitourinary:    General: Normal vulva.     Comments: Pelvic exam: External genitalia normal, WP, GC/CT done by self-swab, cervix closed/thick/long, Uterus is non-tender, no CMT or friability, bilateral adnexal tenderness, R>L.  Musculoskeletal:        General: Normal range of motion.  Skin:    General: Skin is warm and dry.  Neurological:     Mental Status: She is alert and oriented to person, place, and time.  Psychiatric:        Mood and Affect: Mood normal.        Behavior: Behavior normal.        Thought Content: Thought content normal.        Judgment: Judgment normal.    MAU Course  Procedures  MDM [facility] UPT CBC ABO/Rh -- not drawn d/t known O POS HCG Wet Prep GC/CT -- pending HIV -- pending OB < 14 wks [place] with TV  Results for orders placed or performed during the hospital encounter of 04/10/22 (from the past 24 hour(s))  CBC     Status: None   Collection Time: 04/10/22  1:20 PM  Result Value Ref Range   WBC 5.0 4.0 - 10.5 K/uL   RBC 4.13 3.87 - 5.11 MIL/uL   Hemoglobin 13.0 12.0 - 15.0 g/dL   HCT 37.1 36.0 - 46.0 %   MCV 89.8 80.0 - 100.0 fL   MCH 31.5 26.0 - 34.0 pg   MCHC 35.0 30.0 - 36.0 g/dL   RDW 13.3 11.5 - 15.5 %   Platelets 186 150 - 400 K/uL   nRBC 0.0 0.0 - 0.2 %  hCG, quantitative, pregnancy     Status: Abnormal   Collection Time: 04/10/22  1:20 PM  Result Value Ref Range   hCG, Beta Chain, Quant, S 11,606 (H) <5 mIU/mL  HIV Antibody (routine testing w rflx)     Status: None   Collection Time: 04/10/22  1:20 PM  Result Value Ref Range   HIV Screen 4th Generation wRfx Non Reactive Non Reactive  Wet prep, genital     Status: None   Collection Time: 04/10/22  1:23 PM   Result Value Ref Range   Yeast Wet Prep HPF POC NONE SEEN NONE SEEN  Trich, Wet Prep NONE SEEN NONE SEEN   Clue Cells Wet Prep HPF POC NONE SEEN NONE SEEN   WBC, Wet Prep HPF POC <10 <10   Sperm NONE SEEN     US OB LESS THAN 14 WEEKS WITH OB TRANSVAGINAL  Result Date: 04/10/2022 CLINICAL DATA:  Back pain, status post tubal ligation. EXAM: OBSTETRIC <14 WK [place] AND TRANSVAGINAL OB US TECHNIQUE: Both transabdominal and transvaginal ultrasound examinations were performed for complete evaluation of the gestation as well as the maternal uterus, adnexal regions, and pelvic cul-de-sac. Transvaginal technique was performed to assess early pregnancy. COMPARISON:  None Available. FINDINGS: Intrauterine gestational sac: Single Yolk sac:  Visualized. Embryo:  Not Visualized. Cardiac Activity: Not Visualized. MSD: 11 mm   5 w   6 d Subchorionic hemorrhage:  None visualized. Maternal uterus/adnexae: Corpus luteum in the right ovary measuring up to 1.2 cm. IMPRESSION: Intrauterine gestation with a yolk sac and mean sac diameter of 11 mm corresponding to a gestational age of [redacted] weeks and 6 days, fetal pole is not identified, which could be secondary to early gestation. Correlate with serial beta HCG and follow-up as clinically warranted. Electronically Signed   By: Keane Police D.O.   On: 04/10/2022 15:01     Assessment and Plan  Intrauterine pregnancy  - Advised to seek [facility] with a HR OB provider (possibly return to [facility])  Abdominal pain during pregnancy in first trimester - Information provided on abdominal pain in pregnancy   Hypertension affecting pregnancy in first trimester - Information provided on HTN in pregnancy and managing your HTN  [redacted] weeks gestation of pregnancy   - Discharge patient - Keep scheduled appt with [facility] on Tuesday 04/23/2022 for viability U/S - Patient verbalized an understanding of the plan of care and agrees.    Laury Deep, CNM 04/10/2022, 1:35 PM

## 2022-04-10 NOTE — ED Triage Notes (Signed)
Pt c/o  center lower back pain, states feels like bones rubbing together. Pt states she is 11 days late on her cycle and had her tubes clamped 2 yrs ago.

## 2022-04-11 LAB — GC/CHLAMYDIA PROBE AMP (~~LOC~~) NOT AT ARMC
Chlamydia: NEGATIVE
Comment: NEGATIVE
Comment: NORMAL
Neisseria Gonorrhea: NEGATIVE

## 2022-04-11 LAB — RPR: RPR Ser Ql: NONREACTIVE

## 2022-04-16 ENCOUNTER — Telehealth: Payer: Self-pay | Admitting: Family Medicine

## 2022-04-16 NOTE — Telephone Encounter (Signed)
Patient called in wanting to cancel her blood pressure check for 11/22. When the purpose of the appointment was explained to the patient she stated she will just go to her normal doctor for the instead of coming here.

## 2022-04-17 ENCOUNTER — Ambulatory Visit: Payer: Medicaid Other

## 2022-04-17 ENCOUNTER — Other Ambulatory Visit: Payer: Self-pay | Admitting: Lactation Services

## 2022-04-17 DIAGNOSIS — Z3490 Encounter for supervision of normal pregnancy, unspecified, unspecified trimester: Secondary | ICD-10-CM

## 2022-04-23 ENCOUNTER — Ambulatory Visit: Payer: Medicaid Other | Admitting: Advanced Practice Midwife

## 2022-04-23 ENCOUNTER — Other Ambulatory Visit: Payer: Medicaid Other

## 2022-04-28 ENCOUNTER — Other Ambulatory Visit: Payer: Self-pay

## 2022-04-28 ENCOUNTER — Inpatient Hospital Stay (HOSPITAL_COMMUNITY): Payer: Medicaid Other

## 2022-04-28 ENCOUNTER — Inpatient Hospital Stay (HOSPITAL_COMMUNITY)
Admission: AD | Admit: 2022-04-28 | Discharge: 2022-04-28 | Disposition: A | Discharge status: Home / Self Care | Payer: Medicaid Other | Attending: Obstetrics and Gynecology | Admitting: Obstetrics and Gynecology

## 2022-04-28 DIAGNOSIS — O98811 Other maternal infectious and parasitic diseases complicating pregnancy, first trimester: Secondary | ICD-10-CM | POA: Diagnosis not present

## 2022-04-28 DIAGNOSIS — N390 Urinary tract infection, site not specified: Secondary | ICD-10-CM | POA: Diagnosis not present

## 2022-04-28 DIAGNOSIS — O99891 Other specified diseases and conditions complicating pregnancy: Secondary | ICD-10-CM | POA: Insufficient documentation

## 2022-04-28 DIAGNOSIS — R109 Unspecified abdominal pain: Secondary | ICD-10-CM | POA: Diagnosis present

## 2022-04-28 DIAGNOSIS — N839 Noninflammatory disorder of ovary, fallopian tube and broad ligament, unspecified: Secondary | ICD-10-CM | POA: Insufficient documentation

## 2022-04-28 DIAGNOSIS — B379 Candidiasis, unspecified: Secondary | ICD-10-CM

## 2022-04-28 DIAGNOSIS — N898 Other specified noninflammatory disorders of vagina: Secondary | ICD-10-CM

## 2022-04-28 DIAGNOSIS — Z3A08 8 weeks gestation of pregnancy: Secondary | ICD-10-CM | POA: Insufficient documentation

## 2022-04-28 DIAGNOSIS — O2341 Unspecified infection of urinary tract in pregnancy, first trimester: Secondary | ICD-10-CM | POA: Diagnosis not present

## 2022-04-28 LAB — HCG, QUANTITATIVE, PREGNANCY: hCG, Beta Chain, Quant, S: 118367 m[IU]/mL — ABNORMAL HIGH (ref <5)

## 2022-04-28 LAB — URINALYSIS, ROUTINE W REFLEX MICROSCOPIC
Bilirubin Urine: NEGATIVE
Glucose, UA: NEGATIVE mg/dL
Ketones, ur: NEGATIVE mg/dL
Leukocytes,Ua: NEGATIVE
Nitrite: NEGATIVE
Protein, ur: NEGATIVE mg/dL
Specific Gravity, Urine: 1.021 (ref 1.005–1.030)
pH: 5 (ref 5.0–8.0)

## 2022-04-28 LAB — CBC
HCT: 36.5 % (ref 36.0–46.0)
Hemoglobin: 13.1 g/dL (ref 12.0–15.0)
MCH: 31.8 pg (ref 26.0–34.0)
MCHC: 35.9 g/dL (ref 30.0–36.0)
MCV: 88.6 fL (ref 80.0–100.0)
Platelets: 229 10*3/uL (ref 150–400)
RBC: 4.12 MIL/uL (ref 3.87–5.11)
RDW: 12.6 % (ref 11.5–15.5)
WBC: 5.7 10*3/uL (ref 4.0–10.5)
nRBC: 0 % (ref 0.0–0.2)

## 2022-04-28 LAB — WET PREP, GENITAL
Clue Cells Wet Prep HPF POC: NONE SEEN
Sperm: NONE SEEN
Trich, Wet Prep: NONE SEEN
WBC, Wet Prep HPF POC: >=10 — AB (ref <10)

## 2022-04-28 MED ORDER — TERCONAZOLE 0.4 % VA CREA: 1.0000 | TOPICAL_CREAM | Freq: Every day | VAGINAL | 0 refills | Status: DC

## 2022-04-28 MED ORDER — CEFADROXIL 500 MG PO CAPS: 500.0000 mg | ORAL_CAPSULE | Freq: Two times a day (BID) | ORAL | 0 refills | Status: AC

## 2022-04-28 NOTE — MAU Note (Addendum)
Ann Bruce is a 36 y.o. at 24w1dhere in MAU reporting: lower back pain and abdominal cramping. Reports abdominal cramping was worse last night.  Denies VB, noticed slimy vaginal discharge LMP: NA Onset of complaint: last night Pain score: 6 cramping & 9 back pain Vitals:   04/28/22 0934  BP: 134/75  Pulse: 99  Resp: 18  Temp: 98.3 F (36.8 C)  SpO2: 100%     FHT:NA Lab orders placed from triage:   UA

## 2022-04-28 NOTE — MAU Provider Note (Signed)
History     CSN: [id number]  Arrival date and time: 04/28/22 3428   None     Chief Complaint  Patient presents with   Abdominal Pain   Back Pain   Ann Bruce , a  36 y.o. [phone] at 14w1dpresents to MAU with complaints of back and pelvic pain starting last night. She denies vaginal bleeding. Patient denies vaginal and urinary symptoms. Describes pain as intermittent and was worse last night but currently rating pain a 6 & 9 /10. She denies attempting to relieve pain.        Abdominal Pain Pertinent negatives include no constipation, diarrhea, dysuria, fever, headaches, nausea or vomiting.  Back Pain Associated symptoms include abdominal pain. Pertinent negatives include no chest pain, dysuria, fever, headaches, pelvic pain or weakness.    OB History     Gravida  4   Para  3   Term  2   Preterm  1   AB  0   Living  3      SAB  0   IAB  0   Ectopic  0   Multiple  0   Live Births  3           Past Medical History:  Diagnosis Date   Dyspnea    with pregnancy - exertion   Hypertension    Ovarian cyst    UTI (urinary tract infection)     Past Surgical History:  Procedure Laterality Date   APPENDECTOMY     LAPAROSCOPIC TUBAL LIGATION Bilateral 03/22/2020   Procedure: LAPAROSCOPIC TUBAL LIGATION WITH FILSHIE CLIPS;  Surgeon: EChancy Milroy MD;  [place]  Service: Gynecology;  Laterality: Bilateral;   LAPAROTOMY Bilateral 08/04/2019   Procedure: EXPLORATORY LAPAROTOMY WITH BILATERAL OVARIAN CYSTECTOMY;  Surgeon: AOsborne Oman MD;  [place]  Service: Gynecology;  Laterality: Bilateral;  Dr. AHarolyn Rutherfordwill check fetal heart tones before and after surgery   OVARIAN CYST SURGERY  2009    Family History  Problem Relation Age of Onset   Hypertension Mother    Breast cancer Mother    Hypertension Father     Social History   Tobacco Use   Smoking status: Every Day    Packs/day: 0.25    Years: 15.00     Total pack years: 3.75    Types: Cigarettes   Smokeless tobacco: Never   Tobacco comments:    smokes every 3 days  or when stresses, trying to quit  Vaping Use   Vaping Use: Never used  Substance Use Topics   Alcohol use: Yes    Comment: occasional wine    Drug use: Never    Allergies: No Known Allergies  No medications prior to admission.    Review of Systems  Constitutional:  Negative for chills, fatigue and fever.  Eyes:  Negative for pain and visual disturbance.  Respiratory:  Negative for apnea, shortness of breath and wheezing.   Cardiovascular:  Negative for chest pain and palpitations.  Gastrointestinal:  Positive for abdominal pain. Negative for constipation, diarrhea, nausea and vomiting.  Genitourinary:  Negative for difficulty urinating, dysuria, pelvic pain, vaginal bleeding, vaginal discharge and vaginal pain.  Musculoskeletal:  Positive for back pain.  Neurological:  Negative for seizures, weakness and headaches.  Psychiatric/Behavioral:  Negative for suicidal ideas.    Physical Exam   Blood pressure 126/74, pulse 76, temperature 98.3 F (36.8 C), temperature source Oral, resp. rate 16, height '5\' 8"'$  (1.727  m), weight 77.9 kg, last menstrual period 03/02/2022, SpO2 100 %.  Physical Exam Vitals and nursing note reviewed.  Constitutional:      General: She is not in acute distress.    Appearance: Normal appearance.  HENT:     Head: Normocephalic.  Pulmonary:     Effort: Pulmonary effort is normal.  Abdominal:     Palpations: Abdomen is soft.  Genitourinary:    Comments: No bleeding; patient stable for self swab collection.  Musculoskeletal:     Cervical back: Normal range of motion.  Skin:    General: Skin is warm and dry.     Capillary Refill: Capillary refill takes less than 2 seconds.  Neurological:     Mental Status: She is alert and oriented to person, place, and time.  Psychiatric:        Mood and Affect: Mood normal.     MAU Course   Procedures Orders Placed This Encounter  Procedures   Wet prep, genital   Culture, OB Urine   US OB LESS THAN 14 WEEKS WITH OB TRANSVAGINAL   Urinalysis, Routine w reflex microscopic Urine, Clean Catch   CBC   hCG, quantitative, pregnancy   Discharge patient   Results for orders placed or performed during the hospital encounter of 04/28/22 (from the past 24 hour(s))  Urinalysis, Routine w reflex microscopic Urine, Clean Catch     Status: Abnormal   Collection Time: 04/28/22  9:47 AM  Result Value Ref Range   Color, Urine YELLOW YELLOW   APPearance HAZY (A) CLEAR   Specific Gravity, Urine 1.021 1.005 - 1.030   pH 5.0 5.0 - 8.0   Glucose, UA NEGATIVE NEGATIVE mg/dL   Hgb urine dipstick SMALL (A) NEGATIVE   Bilirubin Urine NEGATIVE NEGATIVE   Ketones, ur NEGATIVE NEGATIVE mg/dL   Protein, ur NEGATIVE NEGATIVE mg/dL   Nitrite NEGATIVE NEGATIVE   Leukocytes,Ua NEGATIVE NEGATIVE   RBC / HPF 0-5 0 - 5 RBC/hpf   WBC, UA 0-5 0 - 5 WBC/hpf   Bacteria, UA RARE (A) NONE SEEN   Squamous Epithelial / LPF 0-5 0 - 5   Mucus PRESENT    Hyaline Casts, UA PRESENT   Wet prep, genital     Status: Abnormal   Collection Time: 04/28/22 10:37 AM   Specimen: PATH Cytology Cervicovaginal Ancillary Only  Result Value Ref Range   Yeast Wet Prep HPF POC PRESENT (A) NONE SEEN   Trich, Wet Prep NONE SEEN NONE SEEN   Clue Cells Wet Prep HPF POC NONE SEEN NONE SEEN   WBC, Wet Prep HPF POC >=10 (A) <10   Sperm NONE SEEN   CBC     Status: None   Collection Time: 04/28/22 11:47 AM  Result Value Ref Range   WBC 5.7 4.0 - 10.5 K/uL   RBC 4.12 3.87 - 5.11 MIL/uL   Hemoglobin 13.1 12.0 - 15.0 g/dL   HCT 36.5 36.0 - 46.0 %   MCV 88.6 80.0 - 100.0 fL   MCH 31.8 26.0 - 34.0 pg   MCHC 35.9 30.0 - 36.0 g/dL   RDW 12.6 11.5 - 15.5 %   Platelets 229 150 - 400 K/uL   nRBC 0.0 0.0 - 0.2 %   US OB LESS THAN 14 WEEKS WITH OB TRANSVAGINAL  Result Date: 04/28/2022 CLINICAL DATA:  Abdominal cramping EXAM:  OBSTETRIC <14 WK [place] AND TRANSVAGINAL OB US TECHNIQUE: Both transabdominal and transvaginal ultrasound examinations were performed for complete evaluation of the gestation  as well as the maternal uterus, adnexal regions, and pelvic cul-de-sac. Transvaginal technique was performed to assess early pregnancy. COMPARISON:  04/10/2022 FINDINGS: Intrauterine gestational sac: Single Yolk sac:  Visualized. Embryo:  Visualized. Cardiac Activity: Visualized. Heart Rate: 153 bpm CRL:  17.1 mm   8 w   1 d                  [place] EDC: 12/07/2022 Subchorionic hemorrhage:  None visualized. Maternal uterus/adnexae: Normal size of the bilateral ovaries. An echogenic mass in the right ovary measures 16 mm, nonshadowing and consistent with dermoid. No edematous thickening of the adjacent ovary. IMPRESSION: 1. Single living intrauterine pregnancy measuring 8 weeks 1 day. No unexpected finding. 2. 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 Electronically Signed   By: Jorje Guild M.D.   On: 04/28/2022 11:48      MDM - UA reflexed to culture. With Hyaline Cast and bacteria present, high suspicion for UTI. - Wet prep positive for yeast, otherwise normal.  - WBC and VS normal low suspicion for pyleo.  - US revealed Single living IUP with cardiac activity.  - GC pending  - Plan for discharge   Assessment and Plan   1. Vaginal discharge   2. Yeast infection   3. Urinary tract infection without hematuria, site unspecified   4. [redacted] weeks gestation of pregnancy    - Discussed that pain is likely related to a yeast infection and a UTI.  - Rx for [employer] and [employer] sent to outpatient pharmacy.  - Worsening signs and return precautions reviewed.  - Early pregnancy precautions reviewed.  - Patient plans to establish care at [facility]. Encouraged to schedule a NOB ASAP.  - Patient discharged home in stable condition and may return to MAU.   Jacquiline Doe, MSN CNM  04/28/2022, 12:32 PM

## 2022-04-29 LAB — GC/CHLAMYDIA PROBE AMP (~~LOC~~) NOT AT ARMC
Chlamydia: NEGATIVE
Comment: NEGATIVE
Comment: NORMAL
Neisseria Gonorrhea: NEGATIVE

## 2022-04-29 LAB — CULTURE, OB URINE: Culture: NO GROWTH

## 2022-05-02 ENCOUNTER — Ambulatory Visit (INDEPENDENT_AMBULATORY_CARE_PROVIDER_SITE_OTHER): Payer: Medicaid Other | Admitting: Primary Care

## 2022-05-15 ENCOUNTER — Encounter (INDEPENDENT_AMBULATORY_CARE_PROVIDER_SITE_OTHER): Payer: Self-pay | Admitting: Primary Care

## 2022-05-15 ENCOUNTER — Ambulatory Visit (INDEPENDENT_AMBULATORY_CARE_PROVIDER_SITE_OTHER): Payer: Medicaid Other | Admitting: Primary Care

## 2022-05-15 VITALS — BP 144/96 | HR 93 | Temp 98.1°F | Ht 68.0 in | Wt 168.8 lb

## 2022-05-15 DIAGNOSIS — F4321 Adjustment disorder with depressed mood: Secondary | ICD-10-CM | POA: Diagnosis not present

## 2022-05-15 DIAGNOSIS — Z9889 Other specified postprocedural states: Secondary | ICD-10-CM | POA: Diagnosis not present

## 2022-05-15 DIAGNOSIS — F4323 Adjustment disorder with mixed anxiety and depressed mood: Secondary | ICD-10-CM | POA: Diagnosis not present

## 2022-05-15 NOTE — Progress Notes (Signed)
Patient states she has had an abortion two weeks ago. Would like to follow up today

## 2022-05-21 NOTE — Progress Notes (Signed)
[place], is a 36 y.o. female  Ann Bruce  UKG:[id number]  DOB - 1985/06/19  Chief Complaint  Patient presents with   Gynecologic Exam       Subjective:   [name] is a 36 y.o. female she presents today requesting a Pap GYN visit to make sure everything was okay.  Notes she had an abortion 2 weeks ago.  Birth control she had her tubes clipped and was pregnant.  A traumatic experience she is divorced but still lives with her husband and has 3 kids and he is not able to care for the 3 ones he has . Therefore she went to the clinic they gave her 1 tablet there and 4 tablets to take home she was to follow-up with an ultrasound the pills was supposed to cause death to the fetus however the ultrasound showed a heartbeat and a live fetus here today for a follow up visit. Then the decision more devastating having an abortion which she promise God something she would never do.She is emotionally distraught d/w our clinical nurse.  Patient has No headache, No chest pain, No abdominal pain - No Nausea, No new weakness tingling or numbness, No Cough - shortness of breath  No problems updated.  No Known Allergies  Past Medical History:  Diagnosis Date   Dyspnea    with pregnancy - exertion   Hypertension    Ovarian cyst    UTI (urinary tract infection)     Current Outpatient Medications on File Prior to Visit  Medication Sig Dispense Refill   amLODipine (NORVASC) 10 MG tablet TAKE 1 TABLET(10 MG) BY MOUTH DAILY 90 tablet 1   losartan-hydrochlorothiazide (HYZAAR) 100-25 MG tablet Take 1 tablet by mouth daily. 90 tablet 1   traZODone (DESYREL) 50 MG tablet Take 0.5-2 tablets (25-100 mg total) by mouth at bedtime. For sleep 90 tablet 1   terconazole (TERAZOL 7) 0.4 % vaginal cream Place 1 applicator vaginally at bedtime. Use for seven days (Patient not taking: Reported on 05/15/2022) 45 g 0   No current facility-administered medications on file prior to  visit.    Objective:   Vitals:   05/15/22 0955  BP: (Abnormal) 144/96  Pulse: 93  Temp: 98.1 F (36.7 C)  TempSrc: Oral  SpO2: 98%  Weight: 168 lb 12.8 oz (76.6 kg)  Height: '5\' 8"'$  (1.727 m)    Exam General appearance : Awake, alert, not in any distress. Speech Clear. Not toxic looking HEENT: Atraumatic and Normocephalic, pupils equally reactive to light and accomodation Neck: Supple, no JVD. No cervical lymphadenopathy.  Chest: Good air entry bilaterally, no added sounds  CVS: S1 S2 regular, no murmurs.  Abdomen: Bowel sounds present, Non tender and not distended with no gaurding, rigidity or rebound. Extremities: B/L Lower Ext shows no edema, both legs are warm to touch Neurology: Awake alert, and oriented X 3, CN II-XII intact, Non focal Skin: No Rash  Data Review Lab Results  Component Value Date   HGBA1C 5.6 06/24/2019    Assessment & Plan  Ann Bruce was seen today for gynecologic exam.  Diagnoses and all orders for this visit:  Post-operative state -     Ambulatory referral to Obstetrics / Gynecology  Adjustment disorder with mixed anxiety and depressed mood 2/2 Grief associated with loss of fetus At this present time she will continue to take trazodone for sleep.  Will refer her to clinical social worker     Patient have been counseled extensively  about nutrition and exercise. Other issues discussed during this visit include: low cholesterol diet, weight control and daily exercise, foot care, annual eye examinations at Ophthalmology, importance of adherence with medications and regular follow-up. We also discussed long term complications of uncontrolled diabetes and hypertension.   No follow-ups on file.  The patient was given clear instructions to go to ER or return to medical center if symptoms don't improve, worsen or new problems develop. The patient verbalized understanding. The patient was told to call to get lab results if they haven't heard anything in  the next week.   This note has been created with Surveyor, quantity. Any transcriptional errors are unintentional.   Kerin Perna, NP 05/21/2022, 8:18 PM

## 2022-06-12 ENCOUNTER — Telehealth: Payer: Self-pay | Admitting: Primary Care

## 2022-06-12 NOTE — Telephone Encounter (Signed)
Patient desired an appt via [facility] with Rosana Hoes, she did not request any other other resources at this time. She is scheduled 06/20/22 @ 230pm

## 2022-06-18 ENCOUNTER — Ambulatory Visit (INDEPENDENT_AMBULATORY_CARE_PROVIDER_SITE_OTHER): Payer: Medicaid Other | Admitting: Obstetrics and Gynecology

## 2022-06-18 ENCOUNTER — Encounter: Payer: Self-pay | Admitting: Obstetrics and Gynecology

## 2022-06-18 VITALS — BP 124/84 | HR 88 | Ht 69.0 in | Wt 172.4 lb

## 2022-06-18 DIAGNOSIS — Z3A08 8 weeks gestation of pregnancy: Secondary | ICD-10-CM | POA: Diagnosis not present

## 2022-06-18 DIAGNOSIS — O039 Complete or unspecified spontaneous abortion without complication: Secondary | ICD-10-CM | POA: Diagnosis not present

## 2022-06-18 NOTE — Progress Notes (Signed)
    GYNECOLOGY VISIT  Patient name: Ann Bruce MRN [id number]  Date of birth: 1985/11/26 Chief Complaint:   Follow-up   History:  Ann Bruce is a 37 y.o. [phone] being seen today for TAB followup. Initially had failure with medication and had to have D&C.  No fever or chills, no odor, just daily discharge. Twice since procedure she has had intercourse, starting about 2 weeks after. Prior Filshie clip tubal and she is still sure she does not want any more children.   Menses are regular, not really heavy, mild crammping with pain.   Past Medical History:  Diagnosis Date   Dyspnea    with pregnancy - exertion   Hypertension    Ovarian cyst    UTI (urinary tract infection)     Past Surgical History:  Procedure Laterality Date   APPENDECTOMY     LAPAROSCOPIC TUBAL LIGATION Bilateral 03/22/2020   Procedure: LAPAROSCOPIC TUBAL LIGATION WITH FILSHIE CLIPS;  Surgeon: [name], MD;  Location: [place];  Service: Gynecology;  Laterality: Bilateral;   LAPAROTOMY Bilateral 08/04/2019   Procedure: EXPLORATORY LAPAROTOMY WITH BILATERAL OVARIAN CYSTECTOMY;  Surgeon: Osborne Oman, MD;  Location: [place];  Service: Gynecology;  Laterality: Bilateral;  Dr. Harolyn Rutherford will check fetal heart tones before and after surgery   OVARIAN CYST SURGERY  2009    The following portions of the patient's history were reviewed and updated as appropriate: allergies, current medications, past family history, past medical history, past social history, past surgical history and problem list.   Health Maintenance:   Last pap     Component Value Date/Time   DIAGPAP  06/24/2019 1345    - Negative for intraepithelial lesion or malignancy (NILM)   [place] Positive (A) 06/24/2019 1345   ADEQPAP  06/24/2019 1345    Satisfactory for evaluation; transformation zone component PRESENT.     Review of Systems:  Pertinent items are noted in HPI. Comprehensive review of systems was  otherwise negative.   Objective:  Physical Exam BP 124/84   Pulse 88   Ht '5\' 9"'$  (1.753 m)   Wt 172 lb 6.4 oz (78.2 kg)   LMP 03/02/2022   BMI 25.46 kg/m    Physical Exam Vitals and nursing note reviewed.  Constitutional:      Appearance: Normal appearance.  HENT:     Head: Normocephalic and atraumatic.  Pulmonary:     Effort: Pulmonary effort is normal.  Skin:    General: Skin is warm and dry.  Neurological:     General: No focal deficit present.     Mental Status: She is alert.  Psychiatric:        Mood and Affect: Mood normal.        Behavior: Behavior normal.        Thought Content: Thought content normal.        Judgment: Judgment normal.     Labs and Imaging No results found.     Assessment & Plan:   1. SAB (spontaneous abortion) Doing well from postprocedure standpoint. Discussed that all forms of contraception are options including salpingectomy given hx of pregnancy after tubal ligation. Patient would like to proceed with laparoscopic salpingectomy at this time.     Routine preventative health maintenance measures emphasized.  Darliss Cheney, MD [facility]

## 2022-06-20 ENCOUNTER — Encounter: Payer: Medicaid Other | Admitting: Licensed Clinical Social Worker

## 2022-06-26 ENCOUNTER — Encounter: Payer: Self-pay | Admitting: Obstetrics and Gynecology

## 2022-08-13 NOTE — Pre-Procedure Instructions (Signed)
Surgical Instructions    Your procedure is scheduled on Wednesday 08/21/22.   Report to [facility] Main Entrance "A" at 11:00 A.M., then check in with the Admitting office.  Call this number if you have problems the morning of surgery:  [phone]   If you have any questions prior to your surgery date call [phone]: Open Monday-Friday 8am-4pm If you experience any cold or flu symptoms such as cough, fever, chills, shortness of breath, etc. between now and your scheduled surgery, please notify us at the above number     Remember:  Do not eat after midnight the night before your surgery  You may drink clear liquids until 10:00 A.M. the morning of your surgery.   Clear liquids allowed are: Water, Non-Citrus Juices (without pulp), Carbonated Beverages, Clear Tea, Black Coffee ONLY (NO MILK, CREAM OR POWDERED CREAMER of any kind), and Gatorade    Take these medicines the morning of surgery with A SIP OF WATER:   amLODipine (NORVASC)   atorvastatin (LIPITOR)     As of today, STOP taking any Aspirin (unless otherwise instructed by your surgeon) Aleve, Naproxen, Ibuprofen, Motrin, Advil, Goody's, BC's, all herbal medications, fish oil, and all vitamins.           Do not wear jewelry or makeup. Do not wear lotions, powders, perfumes/cologne or deodorant. Do not shave 48 hours prior to surgery.  Men may shave face and neck. Do not bring valuables to the hospital. Do not wear nail polish, gel polish, artificial nails, or any other type of covering on natural nails (fingers and toes) If you have artificial nails or gel coating that need to be removed by a nail salon, please have this removed prior to surgery. Artificial nails or gel coating may interfere with anesthesia's ability to adequately monitor your vital signs.  [facility] is not responsible for any belongings or valuables.    Do NOT Smoke (Tobacco/Vaping)  24 hours prior to your procedure  If you use a CPAP at night, you may  bring your mask for your overnight stay.   Contacts, glasses, hearing aids, dentures or partials may not be worn into surgery, please bring cases for these belongings   For patients admitted to the hospital, discharge time will be determined by your treatment team.   Patients discharged the day of surgery will not be allowed to drive home, and someone needs to stay with them for 24 hours.   SURGICAL WAITING ROOM VISITATION Patients having surgery or a procedure may have no more than 2 support people in the waiting area - these visitors may rotate.   Children under the age of 49 must have an adult with them who is not the patient. If the patient needs to stay at the hospital during part of their recovery, the visitor guidelines for inpatient rooms apply. Pre-op nurse will coordinate an appropriate time for 1 support person to accompany patient in pre-op.  This support person may not rotate.   Please refer to [email or web address] for the visitor guidelines for Inpatients (after your surgery is over and you are in a regular room).    Special instructions:    Oral Hygiene is also important to reduce your risk of infection.  Remember - BRUSH YOUR TEETH THE MORNING OF SURGERY WITH YOUR REGULAR TOOTHPASTE   [place]- Preparing For Surgery  Before surgery, you can play an important role. Because skin is not sterile, your skin needs to be as free of germs as possible.  You can reduce the number of germs on your skin by washing with CHG (chlorahexidine gluconate) Soap before surgery.  CHG is an antiseptic cleaner which kills germs and bonds with the skin to continue killing germs even after washing.     Please do not use if you have an allergy to CHG or antibacterial soaps. If your skin becomes reddened/irritated stop using the CHG.  Do not shave (including legs and underarms) for at least 48 hours prior to first CHG shower. It is OK to shave your  face.  Please follow these instructions carefully.     Shower the NIGHT BEFORE SURGERY and the MORNING OF SURGERY with CHG Soap.   If you chose to wash your hair, wash your hair first as usual with your normal shampoo. After you shampoo, rinse your hair and body thoroughly to remove the shampoo.  Then [employer] and genitals (private parts) with your normal soap and rinse thoroughly to remove soap.  After that Use CHG Soap as you would any other liquid soap. You can apply CHG directly to the skin and wash gently with a scrungie or a clean washcloth.   Apply the CHG Soap to your body ONLY FROM THE NECK DOWN.  Do not use on open wounds or open sores. Avoid contact with your eyes, ears, mouth and genitals (private parts). Wash Face and genitals (private parts)  with your normal soap.   Wash thoroughly, paying special attention to the area where your surgery will be performed.  Thoroughly rinse your body with warm water from the neck down.  DO NOT shower/wash with your normal soap after using and rinsing off the CHG Soap.  Pat yourself dry with a CLEAN TOWEL.  Wear CLEAN PAJAMAS to bed the night before surgery  Place CLEAN SHEETS on your bed the night before your surgery  DO NOT SLEEP WITH PETS.   Day of Surgery:  Take a shower with CHG soap. Wear Clean/Comfortable clothing the morning of surgery Do not apply any deodorants/lotions.   Remember to brush your teeth WITH YOUR REGULAR TOOTHPASTE.    If you received a COVID test during your pre-op visit, it is requested that you wear a mask when out in public, stay away from anyone that may not be feeling well, and notify your surgeon if you develop symptoms. If you have been in contact with anyone that has tested positive in the last 10 days, please notify your surgeon.    Please read over the following fact sheets that you were given.

## 2022-08-14 ENCOUNTER — Other Ambulatory Visit: Payer: Self-pay

## 2022-08-14 ENCOUNTER — Ambulatory Visit (INDEPENDENT_AMBULATORY_CARE_PROVIDER_SITE_OTHER): Payer: Medicaid Other | Admitting: Primary Care

## 2022-08-14 ENCOUNTER — Encounter (INDEPENDENT_AMBULATORY_CARE_PROVIDER_SITE_OTHER): Payer: Self-pay | Admitting: Primary Care

## 2022-08-14 ENCOUNTER — Encounter (HOSPITAL_COMMUNITY): Payer: Self-pay

## 2022-08-14 ENCOUNTER — Encounter (HOSPITAL_COMMUNITY)
Admission: RE | Admit: 2022-08-14 | Discharge: 2022-08-14 | Disposition: A | Discharge status: Home / Self Care | Payer: Medicaid Other | Source: Ambulatory Visit | Attending: Obstetrics and Gynecology | Admitting: Obstetrics and Gynecology

## 2022-08-14 VITALS — BP 113/83 | HR 84 | Resp 16 | Ht 68.0 in | Wt 166.0 lb

## 2022-08-14 VITALS — BP 111/79 | HR 84 | Temp 98.1°F | Resp 17 | Ht 69.0 in | Wt 165.0 lb

## 2022-08-14 DIAGNOSIS — L989 Disorder of the skin and subcutaneous tissue, unspecified: Secondary | ICD-10-CM

## 2022-08-14 DIAGNOSIS — G4700 Insomnia, unspecified: Secondary | ICD-10-CM | POA: Diagnosis not present

## 2022-08-14 DIAGNOSIS — Z01812 Encounter for preprocedural laboratory examination: Secondary | ICD-10-CM | POA: Diagnosis present

## 2022-08-14 DIAGNOSIS — I10 Essential (primary) hypertension: Secondary | ICD-10-CM | POA: Diagnosis not present

## 2022-08-14 DIAGNOSIS — Z760 Encounter for issue of repeat prescription: Secondary | ICD-10-CM | POA: Diagnosis not present

## 2022-08-14 LAB — CBC
HCT: 40.5 % (ref 36.0–46.0)
Hemoglobin: 13.6 g/dL (ref 12.0–15.0)
MCH: 30.8 pg (ref 26.0–34.0)
MCHC: 33.6 g/dL (ref 30.0–36.0)
MCV: 91.8 fL (ref 80.0–100.0)
Platelets: 244 10*3/uL (ref 150–400)
RBC: 4.41 MIL/uL (ref 3.87–5.11)
RDW: 13.2 % (ref 11.5–15.5)
WBC: 4.7 10*3/uL (ref 4.0–10.5)
nRBC: 0 % (ref 0.0–0.2)

## 2022-08-14 LAB — BASIC METABOLIC PANEL
Anion gap: 14 (ref 5–15)
BUN: 11 mg/dL (ref 6–20)
CO2: 27 mmol/L (ref 22–32)
Calcium: 9.4 mg/dL (ref 8.9–10.3)
Chloride: 98 mmol/L (ref 98–111)
Creatinine, Ser: 0.75 mg/dL (ref 0.44–1.00)
GFR, Estimated: >60 mL/min (ref >60)
Glucose, Bld: 105 mg/dL — ABNORMAL HIGH (ref 70–99)
Potassium: 3.3 mmol/L — ABNORMAL LOW (ref 3.5–5.1)
Sodium: 139 mmol/L (ref 135–145)

## 2022-08-14 MED ORDER — TRAZODONE HCL 50 MG PO TABS: 25.0000 mg | ORAL_TABLET | Freq: Every day | ORAL | 1 refills | Status: DC

## 2022-08-14 NOTE — Progress Notes (Signed)
[place], is a 37 y.o. female  [id number]  [id number]  DOB - [birth year]  Chief Complaint  Patient presents with   Cyst    On the back of head    Medication Management    Wanting to discuss with provider in regards to bp medication        Subjective:   [name] is a 37 y.o. female here today for  medication refill and voices a concern with a bump in her head nap towards her ear on the left side. Unable to palpate or seen area of concern. Asked to take a picture when it is there again. Patient has No headache, No chest pain, No abdominal pain - No Nausea, No new weakness tingling or numbness, No Cough - shortness of breath  No problems updated.  No Known Allergies  Past Medical History:  Diagnosis Date   Dyspnea    with pregnancy - exertion   Hypertension    Ovarian cyst    UTI (urinary tract infection)     Current Outpatient Medications on File Prior to Visit  Medication Sig Dispense Refill   amLODipine (NORVASC) 10 MG tablet TAKE 1 TABLET(10 MG) BY MOUTH DAILY 90 tablet 1   atorvastatin (LIPITOR) 20 MG tablet Take 20 mg by mouth daily.     losartan-hydrochlorothiazide (HYZAAR) 100-25 MG tablet Take 1 tablet by mouth daily. 90 tablet 1   terconazole (TERAZOL 7) 0.4 % vaginal cream Place 1 applicator vaginally at bedtime. Use for seven days (Patient not taking: Reported on 05/15/2022) 45 g 0   No current facility-administered medications on file prior to visit.    Objective:   Vitals:   08/14/22 1105  BP: 113/83  Pulse: 84  Resp: 16  SpO2: 100%  Weight: 166 lb (75.3 kg)  Height: 5\' 8"  (1.727 m)    Comprehensive ROS Pertinent positive and negative noted in HPI   Exam General appearance : Awake, alert, not in any distress. Speech Clear. Not toxic looking HEENT: Atraumatic and Normocephalic, pupils equally reactive to light and accomodation Neck: Supple, no JVD. No cervical lymphadenopathy.  Chest: Good air entry  bilaterally, no added sounds  CVS: S1 S2 regular, no murmurs.  Abdomen: Bowel sounds present, Non tender and not distended with no gaurding, rigidity or rebound. Extremities: B/L Lower Ext shows no edema, both legs are warm to touch Neurology: Awake alert, and oriented X 3, CN II-XII intact, Non focal Skin: No Rash  Data Review Lab Results  Component Value Date   HGBA1C 5.6 06/24/2019    Assessment & Plan  Ann Bruce was seen today for cyst and medication management.  Diagnoses and all orders for this visit:  Bumps on skin Unable to palpate or see area of concern  Medication refill -     traZODone (DESYREL) 50 MG tablet; Take 0.5-2 tablets (25-100 mg total) by mouth at bedtime. For sleep  Insomnia, unspecified type -     traZODone (DESYREL) 50 MG tablet; Take 0.5-2 tablets (25-100 mg total) by mouth at bedtime. For sleep     Patient have been counseled extensively about nutrition and exercise. Other issues discussed during this visit include: low cholesterol diet, weight control and daily exercise, foot care, annual eye examinations at Ophthalmology, importance of adherence with medications and regular follow-up. We also discussed long term complications of uncontrolled diabetes and hypertension.   Return in about 3 months (around 11/14/2022) for HTN f/u.  The patient was given clear instructions  to go to ER or return to medical center if symptoms don't improve, worsen or new problems develop. The patient verbalized understanding. The patient was told to call to get lab results if they haven't heard anything in the next week.   This note has been created with Surveyor, quantity. Any transcriptional errors are unintentional.   Kerin Perna, NP 08/14/2022, 12:07 PM

## 2022-08-14 NOTE — Progress Notes (Addendum)
PCP - Juluis Mire NP  Chest x-ray - NI EKG - 01/11/22 Stress Test - Denies ECHO - Denies Cardiac Cath - Denies  Sleep Study - Denies  DM - Denies  ERAS Protcol -No  COVID TEST- N/A   Anesthesia review: Yes d/t recent cold, Jeneen Rinks will f/u with patient  Patient denies shortness of breath, fever, and chest pain at PAT appointment  Patient says had a cold 1-2 weeks ago with a "little cough" now. No coughing during PAT appt.   No PNA, respiratory infection, or COVID in the past 2 months.   All instructions explained to the patient, with a verbal understanding of the material. Patient agrees to go over the instructions while at home for a better understanding. The opportunity to ask questions was provided.  Modified patient's instructions to be NPO after midnight

## 2022-08-20 NOTE — Anesthesia Preprocedure Evaluation (Signed)
Anesthesia Evaluation  Patient identified by MRN, date of birth, ID band Patient awake    Reviewed: Allergy & Precautions, NPO status , Patient's Chart, lab work & pertinent test results  Airway Mallampati: II  TM Distance: >3 FB Neck ROM: Full    Dental no notable dental hx. (+) Dental Advisory Given, Teeth Intact   Pulmonary neg shortness of breath, neg sleep apnea, neg COPD, neg recent URI, Current Smoker and Patient abstained from smoking. Covid-19 Nucleic Acid Test Results Lab Results      Component                Value               Date                      [place]              NEGATIVE            03/20/2020                [place]              NEGATIVE            12/24/2019                [place]              NEGATIVE            08/02/2019              Pulmonary exam normal breath sounds clear to auscultation       Cardiovascular hypertension, Pt. on medications (-) angina (-) Past MI and (-) CHF Normal cardiovascular exam(-) dysrhythmias  Rhythm:Regular Rate:Normal     Neuro/Psych negative neurological ROS  negative psych ROS   GI/Hepatic negative GI ROS, Neg liver ROS,,,  Endo/Other  negative endocrine ROS    Renal/GU negative Renal ROS     Musculoskeletal negative musculoskeletal ROS (+)    Abdominal   Peds  Hematology negative hematology ROS (+)   Anesthesia Other Findings   Reproductive/Obstetrics                             Anesthesia Physical Anesthesia Plan  ASA: 2  Anesthesia Plan: General   Post-op Pain Management: Tylenol PO (pre-op)*, Gabapentin PO (pre-op)* and Toradol IV (intra-op)*   Induction: Intravenous  PONV Risk Score and Plan: 2 and Ondansetron, Dexamethasone, Treatment may vary due to age or medical condition and Midazolam  Airway Management Planned: Oral ETT  Additional Equipment: None  Intra-op Plan:   Post-operative Plan:  Extubation in OR  Informed Consent: I have reviewed the patients History and Physical, chart, labs and discussed the procedure including the risks, benefits and alternatives for the proposed anesthesia with the patient or authorized representative who has indicated his/her understanding and acceptance.     Dental advisory given  Plan Discussed with: CRNA  Anesthesia Plan Comments: (PAT note by Karoline Caldwell, PA-C:  37 year old female with pertinent history including HTN, current smoker.  Spoke with patient at her preop appointment due to her report of mild cold symptoms 1 to 2 weeks ago (~March 6).  She did not seek care for this, treated conservatively with rapid improvement.  Says the majority of her symptoms resolved within a few days.  At this point she only has very occasional nonproductive coughing.  No  fever, rhinorrhea, purulent sputum, no SOB.  On exam she is well-appearing, in NAD.  Lungs CTAB. She did not cough throughout the interview.  I advised her to continue to monitor the symptoms and I will call to follow-up prior to her surgery to ensure that she continues to improve.  I did speak with the patient on 08/20/2022 and she reports continued improvement.  States she has some occasional nonproductive coughing first thing in the morning but none throughout the day and is otherwise asymptomatic.  This may be more related to smoking status rather than illness.  No shortness of breath, no limitation in her activity level.  Preop labs reviewed, mild hypokalemia with potassium 3.3, otherwise unremarkable.  EKG 01/11/2022: NSR.  Rate 90.   )        Anesthesia Quick Evaluation

## 2022-08-20 NOTE — Progress Notes (Signed)
Anesthesia Chart Review:  37 year old female with pertinent history including HTN, current smoker.  Spoke with patient at her preop appointment due to her report of mild cold symptoms 1 to 2 weeks ago (~March 6).  She did not seek care for this, treated conservatively with rapid improvement.  Says the majority of her symptoms resolved within a few days.  At this point she only has very occasional nonproductive coughing.  No fever, rhinorrhea, purulent sputum, no SOB.  On exam she is well-appearing, in NAD.  Lungs CTAB. She did not cough throughout the interview.  I advised her to continue to monitor the symptoms and I will call to follow-up prior to her surgery to ensure that she continues to improve.  I did speak with the patient on 08/20/2022 and she reports continued improvement.  States she has some occasional nonproductive coughing first thing in the morning but none throughout the day and is otherwise asymptomatic.  This may be more related to smoking status rather than illness.  No shortness of breath, no limitation in her activity level.  Preop labs reviewed, mild hypokalemia with potassium 3.3, otherwise unremarkable.  EKG 01/11/2022: NSR.  Rate 90.    Wynonia Musty [facility] Short Stay Center/Anesthesiology Phone [phone] 08/20/2022 11:09 AM

## 2022-08-21 ENCOUNTER — Ambulatory Visit (HOSPITAL_COMMUNITY): Payer: Medicaid Other | Admitting: Physician Assistant

## 2022-08-21 ENCOUNTER — Ambulatory Visit (HOSPITAL_COMMUNITY)
Admission: RE | Admit: 2022-08-21 | Discharge: 2022-08-21 | Disposition: A | Discharge status: Home / Self Care | Payer: Medicaid Other | Attending: Obstetrics and Gynecology | Admitting: Obstetrics and Gynecology

## 2022-08-21 ENCOUNTER — Ambulatory Visit (HOSPITAL_BASED_OUTPATIENT_CLINIC_OR_DEPARTMENT_OTHER): Payer: Medicaid Other | Admitting: Anesthesiology

## 2022-08-21 ENCOUNTER — Encounter (HOSPITAL_COMMUNITY)
Admission: RE | Disposition: A | Discharge status: Home / Self Care | Payer: Self-pay | Source: Home / Self Care | Attending: Obstetrics and Gynecology

## 2022-08-21 ENCOUNTER — Encounter (HOSPITAL_COMMUNITY): Payer: Self-pay | Admitting: Obstetrics and Gynecology

## 2022-08-21 DIAGNOSIS — N83201 Unspecified ovarian cyst, right side: Secondary | ICD-10-CM

## 2022-08-21 DIAGNOSIS — Z9851 Tubal ligation status: Secondary | ICD-10-CM

## 2022-08-21 DIAGNOSIS — Z302 Encounter for sterilization: Secondary | ICD-10-CM

## 2022-08-21 DIAGNOSIS — F17200 Nicotine dependence, unspecified, uncomplicated: Secondary | ICD-10-CM | POA: Diagnosis not present

## 2022-08-21 DIAGNOSIS — Z9889 Other specified postprocedural states: Secondary | ICD-10-CM

## 2022-08-21 DIAGNOSIS — D270 Benign neoplasm of right ovary: Secondary | ICD-10-CM | POA: Insufficient documentation

## 2022-08-21 DIAGNOSIS — I10 Essential (primary) hypertension: Secondary | ICD-10-CM | POA: Diagnosis not present

## 2022-08-21 HISTORY — PX: LAPAROSCOPIC BILATERAL SALPINGECTOMY: SHX5889

## 2022-08-21 LAB — POCT PREGNANCY, URINE: Preg Test, Ur: NEGATIVE

## 2022-08-21 SURGERY — SALPINGECTOMY, BILATERAL, LAPAROSCOPIC
Anesthesia: General | Laterality: Bilateral

## 2022-08-21 MED ORDER — ONDANSETRON HCL 4 MG/2ML IJ SOLN
INTRAMUSCULAR | Status: AC
  Filled 2022-08-21: qty 2

## 2022-08-21 MED ORDER — ROCURONIUM BROMIDE 10 MG/ML (PF) SYRINGE
PREFILLED_SYRINGE | INTRAVENOUS | Status: AC
  Filled 2022-08-21: qty 10

## 2022-08-21 MED ORDER — AMISULPRIDE (ANTIEMETIC) 5 MG/2ML IV SOLN: 10.0000 mg | Freq: Once | INTRAVENOUS | Status: DC | PRN

## 2022-08-21 MED ORDER — DEXAMETHASONE SODIUM PHOSPHATE 10 MG/ML IJ SOLN
INTRAMUSCULAR | Status: AC
  Filled 2022-08-21: qty 1

## 2022-08-21 MED ORDER — BUPIVACAINE HCL (PF) 0.5 % IJ SOLN
INTRAMUSCULAR | Status: DC | PRN
  Administered 2022-08-21: 20 mL

## 2022-08-21 MED ORDER — DEXAMETHASONE SODIUM PHOSPHATE 10 MG/ML IJ SOLN
INTRAMUSCULAR | Status: DC | PRN
  Administered 2022-08-21: 5 mg via INTRAVENOUS

## 2022-08-21 MED ORDER — IBUPROFEN 600 MG PO TABS: 600.0000 mg | ORAL_TABLET | Freq: Four times a day (QID) | ORAL | 1 refills | Status: DC | PRN

## 2022-08-21 MED ORDER — LIDOCAINE HCL (CARDIAC) PF 100 MG/5ML IV SOSY
PREFILLED_SYRINGE | INTRAVENOUS | Status: DC | PRN
  Administered 2022-08-21: 70 mg via INTRATRACHEAL

## 2022-08-21 MED ORDER — LACTATED RINGERS IV SOLN: INTRAVENOUS | Status: DC

## 2022-08-21 MED ORDER — HYDROMORPHONE HCL 1 MG/ML IJ SOLN: 0.2500 mg | INTRAMUSCULAR | Status: DC | PRN

## 2022-08-21 MED ORDER — ORAL CARE MOUTH RINSE: 15.0000 mL | Freq: Once | OROMUCOSAL | Status: AC

## 2022-08-21 MED ORDER — MIDAZOLAM HCL 2 MG/2ML IJ SOLN
INTRAMUSCULAR | Status: AC
  Filled 2022-08-21: qty 2

## 2022-08-21 MED ORDER — OXYCODONE HCL 5 MG PO TABS
ORAL_TABLET | ORAL | Status: AC
  Filled 2022-08-21: qty 1

## 2022-08-21 MED ORDER — ACETAMINOPHEN 500 MG PO TABS: 1000.0000 mg | ORAL_TABLET | Freq: Once | ORAL | Status: DC

## 2022-08-21 MED ORDER — GABAPENTIN 300 MG PO CAPS: 300.0000 mg | ORAL_CAPSULE | Freq: Once | ORAL | Status: DC

## 2022-08-21 MED ORDER — GLYCOPYRROLATE 0.2 MG/ML IJ SOLN
INTRAMUSCULAR | Status: DC | PRN
  Administered 2022-08-21: .1 mg via INTRAVENOUS

## 2022-08-21 MED ORDER — CHLORHEXIDINE GLUCONATE 0.12 % MT SOLN
15.0000 mL | Freq: Once | OROMUCOSAL | Status: AC
  Administered 2022-08-21: 15 mL via OROMUCOSAL
  Filled 2022-08-21: qty 15

## 2022-08-21 MED ORDER — KETOROLAC TROMETHAMINE 30 MG/ML IJ SOLN
INTRAMUSCULAR | Status: DC | PRN
  Administered 2022-08-21: 30 mg via INTRAVENOUS

## 2022-08-21 MED ORDER — ACETAMINOPHEN 500 MG PO TABS
1000.0000 mg | ORAL_TABLET | ORAL | Status: AC
  Administered 2022-08-21: 1000 mg via ORAL
  Filled 2022-08-21: qty 2

## 2022-08-21 MED ORDER — FENTANYL CITRATE (PF) 250 MCG/5ML IJ SOLN
INTRAMUSCULAR | Status: AC
  Filled 2022-08-21: qty 5

## 2022-08-21 MED ORDER — PROPOFOL 500 MG/50ML IV EMUL
INTRAVENOUS | Status: DC | PRN
  Administered 2022-08-21: 150 ug/kg/min via INTRAVENOUS

## 2022-08-21 MED ORDER — ROCURONIUM BROMIDE 100 MG/10ML IV SOLN
INTRAVENOUS | Status: DC | PRN
  Administered 2022-08-21: 10 mg via INTRAVENOUS
  Administered 2022-08-21: 50 mg via INTRAVENOUS
  Administered 2022-08-21: 20 mg via INTRAVENOUS

## 2022-08-21 MED ORDER — ONDANSETRON HCL 4 MG/2ML IJ SOLN
INTRAMUSCULAR | Status: DC | PRN
  Administered 2022-08-21: 4 mg via INTRAVENOUS

## 2022-08-21 MED ORDER — FENTANYL CITRATE (PF) 250 MCG/5ML IJ SOLN
INTRAMUSCULAR | Status: DC | PRN
  Administered 2022-08-21: 50 ug via INTRAVENOUS
  Administered 2022-08-21: 100 ug via INTRAVENOUS

## 2022-08-21 MED ORDER — SUGAMMADEX SODIUM 200 MG/2ML IV SOLN
INTRAVENOUS | Status: DC | PRN
  Administered 2022-08-21: 200 mg via INTRAVENOUS

## 2022-08-21 MED ORDER — PROPOFOL 10 MG/ML IV BOLUS
INTRAVENOUS | Status: AC
  Filled 2022-08-21: qty 20

## 2022-08-21 MED ORDER — PROMETHAZINE HCL 25 MG/ML IJ SOLN: 6.2500 mg | INTRAMUSCULAR | Status: DC | PRN

## 2022-08-21 MED ORDER — ACETAMINOPHEN 500 MG PO TABS: 500.0000 mg | ORAL_TABLET | Freq: Four times a day (QID) | ORAL | 1 refills | Status: DC | PRN

## 2022-08-21 MED ORDER — LIDOCAINE 2% (20 MG/ML) 5 ML SYRINGE
INTRAMUSCULAR | Status: AC
  Filled 2022-08-21: qty 5

## 2022-08-21 MED ORDER — PROPOFOL 10 MG/ML IV BOLUS
INTRAVENOUS | Status: DC | PRN
  Administered 2022-08-21: 160 mg via INTRAVENOUS

## 2022-08-21 MED ORDER — OXYCODONE HCL 5 MG/5ML PO SOLN
5.0000 mg | Freq: Once | ORAL | Status: AC | PRN
  Administered 2022-08-21: 5 mg via ORAL

## 2022-08-21 MED ORDER — MEPERIDINE HCL 25 MG/ML IJ SOLN: 6.2500 mg | INTRAMUSCULAR | Status: DC | PRN

## 2022-08-21 MED ORDER — OXYCODONE HCL 5 MG PO TABS: 5.0000 mg | ORAL_TABLET | Freq: Once | ORAL | Status: AC | PRN

## 2022-08-21 MED ORDER — BUPIVACAINE HCL (PF) 0.5 % IJ SOLN
INTRAMUSCULAR | Status: AC
  Filled 2022-08-21: qty 30

## 2022-08-21 MED ORDER — OXYCODONE HCL 5 MG PO TABS: 5.0000 mg | ORAL_TABLET | ORAL | 0 refills | Status: DC | PRN

## 2022-08-21 MED ORDER — MIDAZOLAM HCL 5 MG/5ML IJ SOLN
INTRAMUSCULAR | Status: DC | PRN
  Administered 2022-08-21: 2 mg via INTRAVENOUS

## 2022-08-21 MED ORDER — POLYETHYLENE GLYCOL 3350 17 G PO PACK: 17.0000 g | PACK | Freq: Every day | ORAL | 0 refills | Status: DC

## 2022-08-21 SURGICAL SUPPLY — 49 items
ADH SKN CLS APL DERMABOND .7 (GAUZE/BANDAGES/DRESSINGS) ×1
ADH SKN CLS LQ APL DERMABOND (GAUZE/BANDAGES/DRESSINGS) ×1
APL SRG 38 LTWT LNG FL B (MISCELLANEOUS)
APPLICATOR ARISTA FLEXITIP XL (MISCELLANEOUS) IMPLANT
CNTNR URN SCR LID CUP LEK RST (MISCELLANEOUS) IMPLANT
CONT SPEC 4OZ STRL OR WHT (MISCELLANEOUS) ×2
COVER MAYO STAND STRL (DRAPES) ×1 IMPLANT
DERMABOND ADVANCED .7 DNX12 (GAUZE/BANDAGES/DRESSINGS) ×1 IMPLANT
DERMABOND ADVANCED .7 DNX6 (GAUZE/BANDAGES/DRESSINGS) IMPLANT
DRAPE SURG IRRIG POUCH 19X23 (DRAPES) ×1 IMPLANT
DURAPREP 26ML APPLICATOR (WOUND CARE) ×1 IMPLANT
GAUZE 4X4 16PLY ~~LOC~~+RFID DBL (SPONGE) IMPLANT
GLOVE BIOGEL PI IND STRL 7.0 (GLOVE) ×4 IMPLANT
GLOVE ECLIPSE 7.0 STRL STRAW (GLOVE) ×2 IMPLANT
GOWN STRL REUS W/ TWL LRG LVL3 (GOWN DISPOSABLE) ×2 IMPLANT
GOWN STRL REUS W/ TWL XL LVL3 (GOWN DISPOSABLE) ×1 IMPLANT
GOWN STRL REUS W/TWL LRG LVL3 (GOWN DISPOSABLE) ×2
GOWN STRL REUS W/TWL XL LVL3 (GOWN DISPOSABLE) ×1
IRRIG SUCT STRYKERFLOW 2 WTIP (MISCELLANEOUS)
IRRIGATION SUCT STRKRFLW 2 WTP (MISCELLANEOUS) IMPLANT
KIT PINK PAD W/HEAD ARE REST (MISCELLANEOUS) ×1
KIT PINK PAD W/HEAD ARM REST (MISCELLANEOUS) ×1 IMPLANT
KIT TURNOVER KIT B (KITS) ×1 IMPLANT
LIGASURE VESSEL 5MM BLUNT TIP (ELECTROSURGICAL) IMPLANT
MANIPULATOR UTERINE 4.5 ZUMI (MISCELLANEOUS) IMPLANT
NDL INSUFFLATION 14GA 120MM (NEEDLE) ×1 IMPLANT
NEEDLE INSUFFLATION 14GA 120MM (NEEDLE) ×1 IMPLANT
NS IRRIG 1000ML POUR BTL (IV SOLUTION) ×1 IMPLANT
PACK LAPAROSCOPY BASIN (CUSTOM PROCEDURE TRAY) ×1 IMPLANT
POUCH LAPAROSCOPIC INSTRUMENT (MISCELLANEOUS) ×1 IMPLANT
SET TRI-LUMEN FLTR TB AIRSEAL (TUBING) IMPLANT
SET TUBE SMOKE EVAC HIGH FLOW (TUBING) IMPLANT
SHEARS HARMONIC ACE PLUS 36CM (ENDOMECHANICALS) IMPLANT
SLEEVE SCD COMPRESS KNEE MED (STOCKING) ×1 IMPLANT
SLEEVE Z-THREAD 5X100MM (TROCAR) ×1 IMPLANT
SUT MNCRL AB 4-0 PS2 18 (SUTURE) IMPLANT
SUT VIC AB 4-0 PS2 18 (SUTURE) ×1 IMPLANT
SUT VICRYL 0 UR6 27IN ABS (SUTURE) IMPLANT
SYR 10ML LL (SYRINGE) ×1 IMPLANT
SYS BAG RETRIEVAL 10MM (BASKET)
SYSTEM BAG RETRIEVAL 10MM (BASKET) IMPLANT
SYSTEM CARTER THOMASON II (TROCAR) IMPLANT
TOWEL OR 17X24 6PK STRL BLUE (TOWEL DISPOSABLE) ×1 IMPLANT
TRAY FOLEY W/BAG SLVR 14FR LF (SET/KITS/TRAYS/PACK) ×1 IMPLANT
TROCAR KII 8X100ML NONTHREADED (TROCAR) IMPLANT
TROCAR PORT AIRSEAL 5X120 (TROCAR) IMPLANT
TROCAR Z-THREAD BLADED 11X100M (TROCAR) ×1 IMPLANT
TROCAR Z-THREAD FIOS 5X100MM (TROCAR) ×1 IMPLANT
WARMER LAPAROSCOPE (MISCELLANEOUS) ×1 IMPLANT

## 2022-08-21 NOTE — Anesthesia Procedure Notes (Signed)
Procedure Name: Intubation Date/Time: 08/21/2022 2:55 PM  Performed by: Elvin So, CRNAPre-anesthesia Checklist: Patient identified, Emergency Drugs available, Suction available and Patient being monitored Patient Re-evaluated:Patient Re-evaluated prior to induction Oxygen Delivery Method: Circle System Utilized Preoxygenation: Pre-oxygenation with 100% oxygen Induction Type: IV induction Ventilation: Mask ventilation without difficulty Laryngoscope Size: Mac and 3 Grade View: Grade I Tube type: Oral Tube size: 7.0 mm Number of attempts: 1 Airway Equipment and Method: Stylet and Oral airway Placement Confirmation: ETT inserted through vocal cords under direct vision, positive ETCO2 and breath sounds checked- equal and bilateral Secured at: 22 cm Tube secured with: Tape Dental Injury: Teeth and Oropharynx as per pre-operative assessment

## 2022-08-21 NOTE — Anesthesia Postprocedure Evaluation (Signed)
Anesthesia Post Note  Patient: Ann Bruce  Procedure(s) Performed: LAPAROSCOPIC BILATERAL SALPINGECTOMY AND RIGHT OVARIAN CYSTECTOMY (Bilateral)     Patient location during evaluation: PACU Anesthesia Type: General Level of consciousness: sedated Pain management: pain level controlled Vital Signs Assessment: post-procedure vital signs reviewed and stable Respiratory status: spontaneous breathing and respiratory function stable Cardiovascular status: stable Postop Assessment: no apparent nausea or vomiting Anesthetic complications: no  No notable events documented.  Last Vitals:  Vitals:   08/21/22 1642 08/21/22 1657  BP: 130/88 128/84  Pulse: 69 (!) 47  Resp: 12 17  Temp:  36.6 C  SpO2: 96% 99%    Last Pain:  Vitals:   08/21/22 1657  TempSrc:   PainSc: 4                  Jeriyah Granlund DANIEL

## 2022-08-21 NOTE — Transfer of Care (Signed)
Immediate Anesthesia Transfer of Care Note  Patient: Ann Bruce  Procedure(s) Performed: LAPAROSCOPIC BILATERAL SALPINGECTOMY AND RIGHT OVARIAN CYSTECTOMY (Bilateral)  Patient Location: PACU  Anesthesia Type:General  Level of Consciousness: awake and patient cooperative  Airway & Oxygen Therapy: Patient Spontanous Breathing and Patient connected to face mask oxygen  Post-op Assessment: Report given to RN, Post -op Vital signs reviewed and stable, and Patient moving all extremities  Post vital signs: Reviewed and stable  Last Vitals:  Vitals Value Taken Time  BP 104/70 08/21/22 1627  Temp    Pulse 49 08/21/22 1628  Resp 17 08/21/22 1628  SpO2 100 % 08/21/22 1628  Vitals shown include unvalidated device data.  Last Pain:  Vitals:   08/21/22 1119  TempSrc:   PainSc: 0-No pain         Complications: No notable events documented.

## 2022-08-21 NOTE — H&P (Signed)
OB/GYN Pre-Op History and Physical  MELE OKLAND is a 37 y.o. [phone] presenting for permanent sterilization.       Past Medical History:  Diagnosis Date   Dyspnea    with pregnancy - exertion   Hypertension    Ovarian cyst    UTI (urinary tract infection)     Past Surgical History:  Procedure Laterality Date   APPENDECTOMY     LAPAROSCOPIC TUBAL LIGATION Bilateral 03/22/2020   Procedure: LAPAROSCOPIC TUBAL LIGATION WITH FILSHIE CLIPS;  Surgeon: [name], MD;  Location: [facility];  Service: Gynecology;  Laterality: Bilateral;   LAPAROTOMY Bilateral 08/04/2019   Procedure: EXPLORATORY LAPAROTOMY WITH BILATERAL OVARIAN CYSTECTOMY;  Surgeon: Osborne Oman, MD;  Location: [facility];  Service: Gynecology;  Laterality: Bilateral;  Dr. Harolyn Rutherford will check fetal heart tones before and after surgery   OVARIAN CYST SURGERY  2009   again in 2021 cyst removed    OB History  Gravida Para Term Preterm AB Living  4 3 2 1  0 3  SAB IAB Ectopic Multiple Live Births  0 0 0 0 3    # Outcome Date GA Lbr Len/2nd Weight Sex Delivery Anes PTL Lv  4 Gravida           3 Term 12/25/19 [redacted]w[redacted]d / 00:15 2665 g M Vag-Spont EPI  LIV  2 Term 05/22/08   2722 g M Vag-Spont   LIV  1 Preterm 11/18/05   2466 g M Vag-Spont EPI N LIV    Social History   Socioeconomic History   Marital status: Single    Spouse name: Not on file   Number of children: 3   Years of education: Not on file   Highest education level: Some college, no degree  Occupational History   Not on file  Tobacco Use   Smoking status: Every Day    Packs/day: 0.25    Years: 15.00    Additional pack years: 0.00    Total pack years: 3.75    Types: Cigarettes   Smokeless tobacco: Never   Tobacco comments:    smokes every 3 days  or when stresses, trying to quit  Vaping Use   Vaping Use: Never used  Substance and Sexual Activity   Alcohol use: Yes    Comment: occasional wine    Drug use: Never   Sexual  activity: Yes    Birth control/protection: Surgical    Comment: approx [redacted] wks gestation  Other Topics Concern   Not on file  Social History Narrative   Not on file   Social Determinants of Health   Financial Resource Strain: Not on file  Food Insecurity: No Food Insecurity (04/24/2020)   Hunger Vital Sign    Worried About Running Out of Food in the Last Year: Never true    Ran Out of Food in the Last Year: Never true  Transportation Needs: No Transportation Needs (04/24/2020)   PRAPARE - Hydrologist (Medical): No    Lack of Transportation (Non-Medical): No  Physical Activity: Not on file  Stress: Not on file  Social Connections: Not on file    Family History  Problem Relation Age of Onset   Hypertension Mother    Breast cancer Mother    Hypertension Father     Medications Prior to Admission  Medication Sig Dispense Refill Last Dose   amLODipine (NORVASC) 10 MG tablet TAKE 1 TABLET(10 MG) BY MOUTH DAILY 90 tablet 1  08/21/2022 at 1000   atorvastatin (LIPITOR) 20 MG tablet Take 20 mg by mouth daily.   Past Week   losartan-hydrochlorothiazide (HYZAAR) 100-25 MG tablet Take 1 tablet by mouth daily. 90 tablet 1 Past Week   terconazole (TERAZOL 7) 0.4 % vaginal cream Place 1 applicator vaginally at bedtime. Use for seven days (Patient not taking: Reported on 05/15/2022) 45 g 0    traZODone (DESYREL) 50 MG tablet Take 0.5-2 tablets (25-100 mg total) by mouth at bedtime. For sleep 90 tablet 1     No Known Allergies  Review of Systems: Negative except for what is mentioned in HPI.     Physical Exam: BP 103/87   Pulse 69   Temp 98.3 F (36.8 C) (Oral)   Resp 18   Ht 5\' 8"  (1.727 m)   Wt 74.8 kg   LMP 08/07/2022 (Exact Date)   SpO2 100%   BMI 25.09 kg/m  CONSTITUTIONAL: Well-developed, well-nourished female in no acute distress.  HENT:  Normocephalic, atraumatic, External right and left ear normal. Oropharynx is clear and moist EYES:  Conjunctivae and EOM are normal. Pupils are equal, round, and reactive to light. No scleral icterus.  NECK: Normal range of motion, supple, no masses SKIN: Skin is warm and dry. No rash noted. Not diaphoretic. No erythema. No pallor. [place]: Alert and oriented to person, place, and time. Normal reflexes, muscle tone coordination. No cranial nerve deficit noted. PSYCHIATRIC: Normal mood and affect. Normal behavior. Normal judgment and thought content. RESPIRATORY: normal effort PELVIC: Deferred MUSCULOSKELETAL: Normal range of motion. No edema and no tenderness. 2+ distal pulses.   Pertinent Labs/Studies:   Results for orders placed or performed during the hospital encounter of 08/21/22 (from the past 72 hour(s))  Pregnancy, urine POC     Status: None   Collection Time: 08/21/22 11:29 AM  Result Value Ref Range   Preg Test, Ur NEGATIVE NEGATIVE    Comment:        THE SENSITIVITY OF THIS METHODOLOGY IS >24 mIU/mL        Assessment and Plan :KALIANNE DAVIDSON is a 37 y.o. [id number] here for laparoscopic bilateral salpingectomy.    Darliss Cheney, M.D. Minimally Invasive Gynecologic Surgery and Pelvic Pain Specialist Attending [place], [place] for [employer], [place]

## 2022-08-21 NOTE — Op Note (Signed)
Ann Bruce PROCEDURE DATE: 08/21/2022  PREOPERATIVE DIAGNOSIS: undesired fertility  POSTOPERATIVE DIAGNOSIS: undesired fertility, bilateral ovarian cysts PROCEDURE:    right salpingectomy, right ovarian cystectomy, left partial salpingectomy SURGEON: Darliss Cheney, MD ASSISTANT: Loma Sousa, RN and Dr. Roselie Awkward.  An experienced assistant was required given the standard of surgical care given the complexity of the case.  This assistant was needed for exposure, dissection, suctioning, retraction, instrument exchange, and for overall help during the procedure.  INDICATIONS: 37 y.o. Ann Bruce:[id number] with undesired fertility.  Risks of surgery were discussed with the patient including but not limited to: bleeding which may require transfusion; infection which may require antibiotics; injury to surrounding organs; need for additional procedures including laparotomy;  and other postoperative/anesthesia complications. Written informed consent was obtained.    FINDINGS:  Normal appearing Laparoscopically: normal upper abdominal survey with adhesions of the omentum under the umbilicus, normal sized uterus with slight hyperemia, normal appearing right fallopian tube with filshi clip, bilobed right ovary with 3cm cystic lesion on the ovary, slightly enlarged left ovary with 33mm ovairan cyst, edematous distal left fallopian tube with dense adhesions involving the side wall and IP ligament, scar from round ligament to the anterior cul de sac, normal posterior cul de sac  ANESTHESIA: General INTRAVENOUS FLUIDS:  1000 ml of LR URINE OUTPUT: 400cc ESTIMATED BLOOD LOSS:  10 ml SPECIMENS: right ovarian cyst, right fallopian tube, left fallopian tube, filshie clips COMPLICATIONS:  None immediate.   PROCEDURE IN DETAIL:  The patient received intravenous antibiotics and had sequential compression devices applied to her lower extremities while in the preoperative area.  She was then taken to the operating room where  general anesthesia was administered and was found to be adequate.  She was placed in the dorsal lithotomy position, and was prepped and draped in a sterile manner.  A Foley catheter was inserted into her bladder and attached to constant drainage and a uterine manipulator was then advanced into the uterus .  After an adequate timeout was performed, attention was then turned to the patient's abdomen where a 4mm skin incision was made in the LUQ .  The laparoscope and optiview trocar were carefully introduced into the peritoneal cavity but unable to enter the cavity after 2 attempts. Decision then made to entry umbilically. A 5 mm incision was made and the optiview trocar and laparoscope were introduced under direct visualization and intraperitoneal placement was confirmed by low opening intraabdominal pressure with insufflation of carbon dioxide gas.  Adequate pneumoperitoneum was obtained and the upper abdomen surveyed. The patient was then  placed in steep trendelenburg. A survey of the patient's pelvis and abdomen revealed the findings above.   The right mesosalpinx was serially coagulated and divided using LigaSure device.  The fimbriated end of the left fallopian tube was identified and noted to be in mesh within the pelvic sidewall.  Decision then made to divide the fallopian tube at its insertion at the uterine cornua.  This incision was carried laterally to remove 2 cm segment of the fallopian tube.  Given the proximity to the pelvic sidewall and dense adhesions, decision made not to complete full salpingectomy.  Attention was returned to the right side, and the small ovarian cyst was divided at its base using the LigaSure and removed and passed off for pathology.  The left tubal segments were passed off for pathology.  The remaining tube and its Filshie clip were not able to be removed through the 5 mm trocar.  The right lower  quadrant trocar was removed, the incision extended slightly.  An 8 mm trocar  placed.  The right fallopian tube and its clip were then removed intact.  The remaining Filshie clip was also removed and passed off for pathology. The operative site was surveyed, and it was found to be hemostatic.  No intraoperative injury to other surrounding organs was noted.  The abdomen was desufflated and all instruments were then removed from the patient's abdomen.  All skin incisions were closed with 4-0 monocryl in subcuticular fashion and dressed with dermabond after infiltration with local anesthetic. The patient tolerated the procedure well.  Sponge, lap, and needle counts were correct times two.  The patient was then taken to the recovery room awake, extubated and in stable condition.    Darliss Cheney, MD Minimally Invasive Gynecologic Surgery and Chronic Pelvic Pain Specialist Obstetrics and Gynecology, [facility] [place] [facility] 08/21/22

## 2022-08-21 NOTE — Discharge Instructions (Addendum)
For the first 3 days, take tylenol and ibuprofen every 6 hours, regardless of how you feel. These can be taken together or alternated. Take oxycodone as needed for severe pain. I have also sent miralax for constipation. You will have an appointment in about 3 weeks

## 2022-08-21 NOTE — Brief Op Note (Signed)
08/21/2022  4:24 PM  PATIENT:  Ann Bruce  37 y.o. female  PRE-OPERATIVE DIAGNOSIS:  Undesired Fertility  POST-OPERATIVE DIAGNOSIS:  Undesired Fertility, ovarian cyst  PROCEDURE:  Procedure(s): LAPAROSCOPIC BILATERAL SALPINGECTOMY AND RIGHT OVARIAN CYSTECTOMY (Bilateral)  SURGEON:  Surgeon(s) and Role:    Darliss Cheney, MD - Primary    * Woodroe Mode, MD - Assisting  PHYSICIAN ASSISTANT:   ASSISTANTS: Ernestina Penna, RN   ANESTHESIA:   general  EBL:  10 mL   BLOOD ADMINISTERED:none  DRAINS: none   LOCAL MEDICATIONS USED:  BUPIVICAINE   SPECIMEN:  Source of Specimen:  right ovarian cyst, right fallopian tube, part of left fallopian tube, and filshie clips  DISPOSITION OF SPECIMEN:  PATHOLOGY  COUNTS:  YES  TOURNIQUET:  * No tourniquets in log *  DICTATION: .Note written in EPIC  PLAN OF CARE: Discharge to home after PACU  PATIENT DISPOSITION:  PACU - hemodynamically stable.   Delay start of Pharmacological VTE agent (>24hrs) due to surgical blood loss or risk of bleeding: not applicable

## 2022-08-22 ENCOUNTER — Encounter (HOSPITAL_COMMUNITY): Payer: Self-pay | Admitting: Obstetrics and Gynecology

## 2022-08-23 LAB — SURGICAL PATHOLOGY

## 2022-08-28 ENCOUNTER — Encounter: Payer: Self-pay | Admitting: General Practice

## 2022-08-28 ENCOUNTER — Telehealth: Payer: Self-pay | Admitting: Obstetrics and Gynecology

## 2022-08-28 NOTE — Telephone Encounter (Signed)
Patient had surgery, she said she have to work a 12 hour shift this weekend, want to know do she have any restrictions

## 2022-08-28 NOTE — Telephone Encounter (Signed)
Called patient stating I am returning her phone call. Patient states she is supposed to work a 12 hour shift this Saturday and Sunday and wants to know what restrictions she has. 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 Asked patient if she was lifting anything at home and she states her 38 year old sometimes but she tries not to as it is painful. 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 Told patient I can create a letter in mychart for her. Patient verbalized understanding to all.

## 2022-08-29 DIAGNOSIS — Z302 Encounter for sterilization: Secondary | ICD-10-CM

## 2022-09-16 ENCOUNTER — Ambulatory Visit: Payer: Medicaid Other | Admitting: Obstetrics and Gynecology

## 2022-09-24 ENCOUNTER — Ambulatory Visit (INDEPENDENT_AMBULATORY_CARE_PROVIDER_SITE_OTHER): Payer: Medicaid Other | Admitting: Obstetrics and Gynecology

## 2022-09-24 ENCOUNTER — Encounter: Payer: Self-pay | Admitting: Obstetrics and Gynecology

## 2022-09-24 ENCOUNTER — Other Ambulatory Visit: Payer: Self-pay

## 2022-09-24 VITALS — BP 130/87 | HR 90 | Ht 68.5 in | Wt 170.5 lb

## 2022-09-24 DIAGNOSIS — Z1331 Encounter for screening for depression: Secondary | ICD-10-CM

## 2022-09-24 DIAGNOSIS — N393 Stress incontinence (female) (male): Secondary | ICD-10-CM

## 2022-09-24 NOTE — Progress Notes (Signed)
   POSTOPERATIVE VISIT NOTE   Subjective:     Ann Bruce is a 36 y.o. [phone] who presents to the clinic 4 weeks status post laparoscopy for adnexal mass and requested sterilization. Eating a regular diet without difficulty. Bowel movements are normal. The patient is not having any pain. Incision: well healed Vaginal bleeding: none  Notes hx of urinary incontinence most notably with sneeze/laugh/cough. Symptoms predate surgery.   The following portions of the patient's history were reviewed and updated as appropriate: allergies, current medications, past family history, past medical history, past social history, past surgical history, and problem list..   Review of Systems Pertinent items are noted in HPI.    Objective:    BP 130/87   Pulse 90   Ht 5' 8.5" (1.74 m)   Wt 170 lb 8 oz (77.3 kg)   LMP 09/02/2022 (Exact Date) Comment: lasted 5 days  BMI 25.55 kg/m  General:  alert, cooperative, and no distress  Abdomen: soft, non-tender  Incision:   healing well, no drainage, no erythema, no hernia, no seroma, no swelling, no dehiscence, incision well approximated  Pelvic:   Exam deferred.    Pathology Results: FINAL MICROSCOPIC DIAGNOSIS:   A. FALLOPIAN TUBE, LEFT, SALPINGECTOMY:  - Segment of fallopian tube with complete cross section   B. OVARIAN CYST, RIGHT, EXCISION:  - Benign serous cystadenoma, 1 cm  - No evidence of malignancy   C. FALLOPIAN TUBE, RIGHT, SALPINGECTOMY:  - Segment of fallopian tube with complete cross section    Assessment:   Doing well postoperatively. Operative findings again reviewed. Pathology report discussed.   1. SUI (stress urinary incontinence, female) PFPT referral for SUI - Ambulatory referral to Physical Therapy  2. Positive depression screening IBH referral  - Amb ref to Integrated Behavioral Health    Plan:   1. Continue any current medications. 2. Discussed intraoperative findings 3. Activity restrictions: none 4.  Anticipated return to work: has already returned to work. 5. Follow up as needed  6. IBH referral or positive depression screening  Lorriane Shire, MD Obstetrician & Gynecologist, [facility] for [employer], [facility]

## 2022-09-26 NOTE — BH Specialist Note (Deleted)
Integrated Behavioral Health via Telemedicine Visit  09/26/2022 Ann Bruce [id number]  Number of Integrated Behavioral Health Clinician visits: No data recorded Session Start time: No data recorded  Session End time: No data recorded Total time in minutes: No data recorded  Referring Provider: *** Patient/Family location: *** [facility] Provider location: *** All persons participating in visit: *** Types of Service: {CHL AMB TYPE OF SERVICE:[phone]}  I connected with Corrinne Eagle and/or Justice Rocher Seher's {family members:20773} via  Telephone or Video Enabled Telemedicine Application  (Video is Caregility application) and verified that I am speaking with the correct person using two identifiers. Discussed confidentiality: {YES/NO:21197}  I discussed the limitations of telemedicine and the availability of in person appointments.  Discussed there is a possibility of technology failure and discussed alternative modes of communication if that failure occurs.  I discussed that engaging in this telemedicine visit, they consent to the provision of behavioral healthcare and the services will be billed under their insurance.  Patient and/or legal guardian expressed understanding and consented to Telemedicine visit: {YES/NO:21197}  Presenting Concerns: Patient and/or family reports the following symptoms/concerns: *** Duration of problem: ***; Severity of problem: {Mild/Moderate/Severe:20260}  Patient and/or Family's Strengths/Protective Factors: {CHL AMB BH PROTECTIVE [phone]}  Goals Addressed: Patient will:  Reduce symptoms of: {IBH Symptoms:21014056}   Increase knowledge and/or ability of: {IBH Patient Tools:21014057}   Demonstrate ability to: {IBH Goals:21014053}  Progress towards Goals: {CHL AMB BH PROGRESS TOWARDS [phone]}  Interventions: Interventions utilized:  {IBH Interventions:21014054} Standardized Assessments completed: {IBH Screening  Tools:21014051}  Patient and/or Family Response: ***  Assessment: Patient currently experiencing ***.   Patient may benefit from ***.  Plan: Follow up with behavioral health clinician on : *** Behavioral recommendations: *** Referral(s): {IBH Referrals:21014055}  I discussed the assessment and treatment plan with the patient and/or parent/guardian. They were provided an opportunity to ask questions and all were answered. They agreed with the plan and demonstrated an understanding of the instructions.   They were advised to call back or seek an in-person evaluation if the symptoms worsen or if the condition fails to improve as anticipated.  Valetta Close Bobetta Korf, LCSW

## 2022-10-01 ENCOUNTER — Encounter: Payer: Medicaid Other | Admitting: Clinical

## 2022-10-17 ENCOUNTER — Other Ambulatory Visit (INDEPENDENT_AMBULATORY_CARE_PROVIDER_SITE_OTHER): Payer: Self-pay | Admitting: Primary Care

## 2022-10-17 NOTE — Telephone Encounter (Signed)
Requested medication (s) are due for refill today:   Provider to review  Last prescribed by another provider from a hospital visit  Requested medication (s) are on the active medication list:   Yes as historical  Future visit scheduled:   Yes in 4 weeks with Marcelino Duster   Last ordered: 08/08/2022 from hospital provider  Returned because prescribed by another provider   Requested Prescriptions  Pending Prescriptions Disp Refills   atorvastatin (LIPITOR) 20 MG tablet [Pharmacy Med Name: ATORVASTATIN 20MG  TABLETS] 90 tablet     Sig: TAKE 1 TABLET(20 MG) BY MOUTH DAILY     Cardiovascular:  Antilipid - Statins Failed - 10/17/2022 10:10 AM      Failed - Lipid Panel in normal range within the last 12 months    Cholesterol, Total  Date Value Ref Range Status  04/02/2021 210 (H) 100 - 199 mg/dL Final   LDL Chol Calc (NIH)  Date Value Ref Range Status  04/02/2021 150 (H) 0 - 99 mg/dL Final   HDL  Date Value Ref Range Status  04/02/2021 45 >39 mg/dL Final   Triglycerides  Date Value Ref Range Status  04/02/2021 81 0 - 149 mg/dL Final         Passed - Patient is not pregnant      Passed - Valid encounter within last 12 months    Recent Outpatient Visits           2 months ago Bumps on skin   [place] [facility] Grayce Sessions, NP   5 months ago Post-operative state   [place] [facility] Grayce Sessions, NP   6 months ago Insomnia, unspecified type   [place] [facility] Grayce Sessions, NP   10 months ago Vaginal discharge   [place] [facility] Grayce Sessions, NP   1 year ago Urinary frequency   [place] [facility] Grayce Sessions, NP       Future Appointments             In 4 weeks Randa Evens, Kinnie Scales, NP [place] [facility]

## 2022-10-18 NOTE — Telephone Encounter (Signed)
Will forward to provider  

## 2022-11-14 ENCOUNTER — Encounter (INDEPENDENT_AMBULATORY_CARE_PROVIDER_SITE_OTHER): Payer: Self-pay | Admitting: Primary Care

## 2022-11-14 ENCOUNTER — Other Ambulatory Visit (HOSPITAL_COMMUNITY)
Admission: RE | Admit: 2022-11-14 | Discharge: 2022-11-14 | Disposition: A | Discharge status: Home / Self Care | Payer: Medicaid Other | Source: Ambulatory Visit | Attending: Primary Care | Admitting: Primary Care

## 2022-11-14 ENCOUNTER — Ambulatory Visit (INDEPENDENT_AMBULATORY_CARE_PROVIDER_SITE_OTHER): Payer: Medicaid Other | Admitting: Primary Care

## 2022-11-14 VITALS — BP 145/83 | HR 89 | Resp 16 | Ht 68.0 in | Wt 168.0 lb

## 2022-11-14 DIAGNOSIS — N898 Other specified noninflammatory disorders of vagina: Secondary | ICD-10-CM

## 2022-11-14 DIAGNOSIS — R350 Frequency of micturition: Secondary | ICD-10-CM | POA: Diagnosis not present

## 2022-11-14 DIAGNOSIS — R3589 Other polyuria: Secondary | ICD-10-CM

## 2022-11-14 LAB — POCT URINALYSIS DIP (CLINITEK)
Bilirubin, UA: NEGATIVE
Glucose, UA: NEGATIVE mg/dL
Ketones, POC UA: NEGATIVE mg/dL
Leukocytes, UA: NEGATIVE
Nitrite, UA: NEGATIVE
POC PROTEIN,UA: NEGATIVE
Spec Grav, UA: 1.01 (ref 1.010–1.025)
Urobilinogen, UA: 0.2 E.U./dL
pH, UA: 5.5 (ref 5.0–8.0)

## 2022-11-18 LAB — CERVICOVAGINAL ANCILLARY ONLY
Bacterial Vaginitis (gardnerella): NEGATIVE
Candida Glabrata: NEGATIVE
Candida Vaginitis: NEGATIVE
Chlamydia: NEGATIVE
Comment: NEGATIVE
Comment: NEGATIVE
Comment: NEGATIVE
Comment: NEGATIVE
Comment: NEGATIVE
Comment: NORMAL
Neisseria Gonorrhea: NEGATIVE
Trichomonas: NEGATIVE

## 2022-11-18 NOTE — Progress Notes (Signed)
[facility]   Subjective:  [name] is a 37 y.o. [phone] female complains of an abnormal vaginal discharge for 5 days. Discharge described as: scant, white, thin, and mucoid. Vaginal symptoms include local irritation.Vulvar symptoms include local irritation.STI Risk: Very low risk of STD exposure.   Other associated symptoms: urinary symptoms of urinary frequency.Menstrual pattern:  She has not changes in soaps, detergents coinciding with the onset of her symptoms.  She has not previously self treated or been under treatment by another provider for these symptoms.     Gynecologic History Patient's last menstrual period was 03/02/2022.   Obstetric History OB History  Gravida Para Term Preterm AB Living  4 3 2 1 1 3   SAB IAB Ectopic Multiple Live Births  0 1 0 0 3    # Outcome Date GA Lbr Len/2nd Weight Sex Delivery Anes PTL Lv  4 IAB 04/2022          3 Term 12/25/19 [redacted]w[redacted]d / 00:15 5 lb 14 oz (2.665 kg) M Vag-Spont EPI  LIV  2 Term 05/22/08   6 lb (2.722 kg) M Vag-Spont   LIV  1 Preterm 11/18/05   5 lb 7 oz (2.466 kg) M Vag-Spont EPI N LIV    Past Medical History:  Diagnosis Date   Dyspnea    with pregnancy - exertion   Hypertension    Ovarian cyst    UTI (urinary tract infection)     Past Surgical History:  Procedure Laterality Date   APPENDECTOMY     LAPAROSCOPIC BILATERAL SALPINGECTOMY Bilateral 08/21/2022   Procedure: LAPAROSCOPIC BILATERAL SALPINGECTOMY AND RIGHT OVARIAN CYSTECTOMY;  Surgeon: Lorriane Shire, MD;  Location: [facility] OR;  Service: Gynecology;  Laterality: Bilateral;   LAPAROSCOPIC TUBAL LIGATION Bilateral 03/22/2020   Procedure: LAPAROSCOPIC TUBAL LIGATION WITH FILSHIE CLIPS;  Surgeon: Hermina Staggers, MD;  Location: [facility];  Service: Gynecology;  Laterality: Bilateral;   LAPAROTOMY Bilateral 08/04/2019   Procedure: EXPLORATORY LAPAROTOMY WITH BILATERAL OVARIAN CYSTECTOMY;  Surgeon: Tereso Newcomer, MD;   Location: [facility] OR;  Service: Gynecology;  Laterality: Bilateral;  Dr. Macon Large will check fetal heart tones before and after surgery   OVARIAN CYST SURGERY  2009   again in 2021 cyst removed    Current Outpatient Medications on File Prior to Visit  Medication Sig Dispense Refill   acetaminophen (TYLENOL) 500 MG tablet Take 1 tablet (500 mg total) by mouth every 6 (six) hours as needed. (Patient not taking: Reported on 09/24/2022) 90 tablet 1   amLODipine (NORVASC) 10 MG tablet TAKE 1 TABLET(10 MG) BY MOUTH DAILY 90 tablet 1   atorvastatin (LIPITOR) 20 MG tablet Take 20 mg by mouth daily.     ibuprofen (ADVIL) 600 MG tablet Take 1 tablet (600 mg total) by mouth every 6 (six) hours as needed. (Patient not taking: Reported on 09/24/2022) 90 tablet 1   losartan-hydrochlorothiazide (HYZAAR) 100-25 MG tablet Take 1 tablet by mouth daily. (Patient not taking: Reported on 09/24/2022) 90 tablet 1   oxyCODONE (OXY IR/ROXICODONE) 5 MG immediate release tablet Take 1 tablet (5 mg total) by mouth every 4 (four) hours as needed for severe pain or breakthrough pain. (Patient not taking: Reported on 09/24/2022) 12 tablet 0   polyethylene glycol (MIRALAX / GLYCOLAX) 17 g packet Take 17 g by mouth daily. (Patient not taking: Reported on 09/24/2022) 14 each 0   traZODone (DESYREL) 50 MG tablet Take 0.5-2 tablets (25-100 mg total) by mouth at bedtime.  For sleep 90 tablet 1   No current facility-administered medications on file prior to visit.    No Known Allergies  Social History   Socioeconomic History   Marital status: Single    Spouse name: Not on file   Number of children: 3   Years of education: Not on file   Highest education level: Some college, no degree  Occupational History   Not on file  Tobacco Use   Smoking status: Every Day    Packs/day: 0.25    Years: 15.00    Additional pack years: 0.00    Total pack years: 3.75    Types: Cigarettes   Smokeless tobacco: Never   Tobacco comments:    smokes  every 3 days  or when stresses, trying to quit  Vaping Use   Vaping Use: Never used  Substance and Sexual Activity   Alcohol use: Not Currently    Comment: occasional wine    Drug use: Never   Sexual activity: Not Currently    Birth control/protection: Surgical    Comment: BTL  Other Topics Concern   Not on file  Social History Narrative   Not on file   Social Determinants of Health   Financial Resource Strain: Not on file  Food Insecurity: Patient Declined (09/24/2022)   Hunger Vital Sign    Worried About Running Out of Food in the Last Year: Patient declined    Ran Out of Food in the Last Year: Patient declined  Transportation Needs: Patient Declined (09/24/2022)   PRAPARE - Administrator, Civil Service (Medical): Patient declined    Lack of Transportation (Non-Medical): Patient declined  Physical Activity: Not on file  Stress: Not on file  Social Connections: Not on file  Intimate Partner Violence: Not on file    Family History  Problem Relation Age of Onset   Hypertension Mother    Breast cancer Mother    Hypertension Father     The following portions of the patient's history were reviewed and updated as appropriate: allergies, current medications, past family history, past medical history, past social history, past surgical history and problem list.  Review of Systems Comprehensive ROS Pertinent positive and negative noted in HPI     Objective:  Blood Pressure (Abnormal) 145/83 (BP Location: Left Arm, Patient Position: Sitting, Cuff Size: Normal)   Pulse 89   Respiration 16   Height 5\' 8"  (1.727 m)   Weight 168 lb (76.2 kg)   Last Menstrual Period 03/02/2022   Oxygen Saturation 100%   Body Mass Index 25.54 kg/m  CONSTITUTIONAL: Well-developed, well-nourished female in no acute distress.  HENT:  Normocephalic, atraumatic, External right and left ear normal. Oropharynx is clear and moist EYES: Conjunctivae and EOM are normal. Pupils are equal,  round, and reactive to light. No scleral icterus.  NECK: Normal range of motion, supple, no masses.  Normal thyroid.  SKIN: Skin is warm and dry. No rash noted. Not diaphoretic. No erythema. No pallor. NEUROLGIC: Alert and oriented to person, place, and time. Normal reflexes, muscle tone coordination. No cranial nerve deficit noted. PSYCHIATRIC: Normal mood and affect. Normal behavior. Normal judgment and thought content. CARDIOVASCULAR: Normal heart rate noted, regular rhythm RESPIRATORY: Clear to auscultation bilaterally. Effort and breath sounds normal, no problems with respiration noted. ABDOMEN: Soft, normal bowel sounds, no distention noted.  No tenderness, rebound or guarding.  MUSCULOSKELETAL: Normal range of motion. No tenderness.  No cyanosis, clubbing, or edema.  2+ distal pulses.  Assessment:  Anice was seen today for hypertension.  Diagnoses and all orders for this visit:  Frequency of urination and polyuria -     POCT URINALYSIS DIP (CLINITEK)  Vaginal discharge -     Cervicovaginal ancillary only  This note has been created with Education officer, environmental. Any transcriptional errors are unintentional.   Grayce Sessions, NP 11/18/2022, 2:27 PM

## 2022-11-20 NOTE — Therapy (Deleted)
OUTPATIENT PHYSICAL THERAPY FEMALE PELVIC EVALUATION   Patient Name: Ann Bruce MRN: [id number] DOB:12-29-1985, 37 y.o., female Today's Date: 11/20/2022  END OF SESSION:   Past Medical History:  Diagnosis Date   Dyspnea    with pregnancy - exertion   Hypertension    Ovarian cyst    UTI (urinary tract infection)    Past Surgical History:  Procedure Laterality Date   APPENDECTOMY     LAPAROSCOPIC BILATERAL SALPINGECTOMY Bilateral 08/21/2022   Procedure: LAPAROSCOPIC BILATERAL SALPINGECTOMY AND RIGHT OVARIAN CYSTECTOMY;  Surgeon: Lorriane Shire, MD;  Location: [facility];  Service: Gynecology;  Laterality: Bilateral;   LAPAROSCOPIC TUBAL LIGATION Bilateral 03/22/2020   Procedure: LAPAROSCOPIC TUBAL LIGATION WITH FILSHIE CLIPS;  Surgeon: Hermina Staggers, MD;  Location: [facility];  Service: Gynecology;  Laterality: Bilateral;   LAPAROTOMY Bilateral 08/04/2019   Procedure: EXPLORATORY LAPAROTOMY WITH BILATERAL OVARIAN CYSTECTOMY;  Surgeon: Tereso Newcomer, MD;  Location: [facility];  Service: Gynecology;  Laterality: Bilateral;  Dr. Macon Large will check fetal heart tones before and after surgery   OVARIAN CYST SURGERY  2009   again in 2021 cyst removed   Patient Active Problem List   Diagnosis Date Noted   Admission for sterilization 08/29/2022   Post-operative state 04/24/2020   History of tubal ligation 04/24/2020    PCP: Grayce Sessions, NP  REFERRING PROVIDER: Lorriane Shire, MD   REFERRING DIAG: N39.3 (ICD-10-CM) - SUI (stress urinary incontinence, female)   THERAPY DIAG:  No diagnosis found.  Rationale for Evaluation and Treatment: Rehabilitation  ONSET DATE: ***  SUBJECTIVE:                                                                                                                                                                                           SUBJECTIVE STATEMENT: 4 weeks status post laparoscopy for adnexal mass and requested  sterilization  Fluid intake: {Yes/No:304960894}   PAIN:  Are you having pain? {yes/no:20286} NPRS scale: ***/10 Pain location: {pelvic pain location:27098}  Pain type: {type:313116} Pain description: {PAIN DESCRIPTION:21022940}   Aggravating factors: *** Relieving factors: ***  PRECAUTIONS: {Therapy precautions:24002}  WEIGHT BEARING RESTRICTIONS: {Yes ***/No:24003}  FALLS:  Has patient fallen in last 6 months? {fallsyesno:27318}  LIVING ENVIRONMENT: Lives with: {[facility] lives with:25569::"lives with their family"} Lives in: {Lives in:25570} Stairs: {opstairs:27293} Has following equipment at home: {Assistive devices:23999}  OCCUPATION: ***  PLOF: {PLOF:24004}  PATIENT GOALS: ***  PERTINENT HISTORY:  Appendectomy; Laparoscopic bilateral salpingectomy 08/21/22; Laparoscopic tubal ligation 03/22/20; Exploratory laparotomy with bilateral ovarian cystectomy 08/04/19; Ovarian cyst surgery 2009 Sexual abuse: {Yes/No:304960894}  BOWEL MOVEMENT: Pain with bowel movement: {yes/no:20286} Type  of bowel movement:{PT BM type:27100} Fully empty rectum: {Yes/No:304960894} Leakage: {Yes/No:304960894} Pads: {Yes/No:304960894} Fiber supplement: {Yes/No:304960894}  URINATION: Pain with urination: {yes/no:20286} Fully empty bladder: {Yes/No:304960894} Stream: {PT urination:27102} Urgency: {Yes/No:304960894} Frequency: *** Leakage: Coughing, Sneezing, and Laughing Pads: {Yes/No:304960894}  INTERCOURSE: Pain with intercourse: {pain with intercourse PA:27099} Ability to have vaginal penetration:  {Yes/No:304960894} Climax: *** Marinoff Scale: ***/3  PREGNANCY: Vaginal deliveries *** Tearing {Yes***/No:304960894} C-section deliveries *** Currently pregnant {Yes***/No:304960894}  PROLAPSE: {PT prolapse:27101}   OBJECTIVE:   DIAGNOSTIC FINDINGS:  ***  PATIENT SURVEYS:  {rehab surveys:24030}  PFIQ-7 ***  COGNITION: Overall cognitive status:  {cognition:24006}     SENSATION: Light touch: {intact/deficits:24005} Proprioception: {intact/deficits:24005}  MUSCLE LENGTH: Hamstrings: Right *** deg; Left *** deg Thomas test: Right *** deg; Left *** deg  LUMBAR SPECIAL TESTS:  {lumbar special test:25242}  FUNCTIONAL TESTS:  {Functional tests:24029}  GAIT: Distance walked: *** Assistive device utilized: {Assistive devices:23999} Level of assistance: {Levels of assistance:24026} Comments: ***  POSTURE: {posture:25561}  PELVIC ALIGNMENT:  LUMBARAROM/PROM:  A/PROM A/PROM  eval  Flexion   Extension   Right lateral flexion   Left lateral flexion   Right rotation   Left rotation    (Blank rows = not tested)  LOWER EXTREMITY ROM:  {AROM/PROM:27142} ROM Right eval Left eval  Hip flexion    Hip extension    Hip abduction    Hip adduction    Hip internal rotation    Hip external rotation    Knee flexion    Knee extension    Ankle dorsiflexion    Ankle plantarflexion    Ankle inversion    Ankle eversion     (Blank rows = not tested)  LOWER EXTREMITY MMT:  MMT Right eval Left eval  Hip flexion    Hip extension    Hip abduction    Hip adduction    Hip internal rotation    Hip external rotation    Knee flexion    Knee extension    Ankle dorsiflexion    Ankle plantarflexion    Ankle inversion    Ankle eversion     PALPATION:   General  ***                External Perineal Exam ***                             Internal Pelvic Floor ***  Patient confirms identification and approves PT to assess internal pelvic floor and treatment {yes/no:20286}  PELVIC MMT:   MMT eval  Vaginal   Internal Anal Sphincter   External Anal Sphincter   Puborectalis   Diastasis Recti   (Blank rows = not tested)        TONE: ***  PROLAPSE: ***  TODAY'S TREATMENT:  DATE: ***  EVAL  ***   PATIENT EDUCATION:  Education details: *** Person educated: {Person educated:25204} Education method: {Education Method:25205} Education comprehension: {Education Comprehension:25206}  HOME EXERCISE PROGRAM: ***  ASSESSMENT:  CLINICAL IMPRESSION: Patient is a *** y.o. *** who was seen today for physical therapy evaluation and treatment for ***.   OBJECTIVE IMPAIRMENTS: {opptimpairments:25111}.   ACTIVITY LIMITATIONS: {activitylimitations:27494}  PARTICIPATION LIMITATIONS: {participationrestrictions:25113}  PERSONAL FACTORS: {Personal factors:25162} are also affecting patient's functional outcome.   REHAB POTENTIAL: {rehabpotential:25112}  CLINICAL DECISION MAKING: {clinical decision making:25114}  EVALUATION COMPLEXITY: {Evaluation complexity:25115}   GOALS: Goals reviewed with patient? {yes/no:20286}  SHORT TERM GOALS: Target date: ***  *** Baseline: Goal status: INITIAL  2.  *** Baseline:  Goal status: INITIAL  3.  *** Baseline:  Goal status: INITIAL  4.  *** Baseline:  Goal status: INITIAL  5.  *** Baseline:  Goal status: INITIAL  6.  *** Baseline:  Goal status: INITIAL  LONG TERM GOALS: Target date: ***  *** Baseline:  Goal status: INITIAL  2.  *** Baseline:  Goal status: INITIAL  3.  *** Baseline:  Goal status: INITIAL  4.  *** Baseline:  Goal status: INITIAL  5.  *** Baseline:  Goal status: INITIAL  6.  *** Baseline:  Goal status: INITIAL  PLAN:  PT FREQUENCY: {rehab frequency:25116}  PT DURATION: {rehab duration:25117}  PLANNED INTERVENTIONS: {rehab planned interventions:25118::"Therapeutic exercises","Therapeutic activity","Neuromuscular re-education","Balance training","Gait training","Patient/Family education","Self Care","Joint mobilization"}  PLAN FOR NEXT SESSION: ***   Shawny Borkowski, PT 11/20/2022, 9:58 AM

## 2022-11-21 ENCOUNTER — Encounter: Payer: Medicaid Other | Admitting: Physical Therapy

## 2022-12-05 ENCOUNTER — Encounter: Payer: Medicaid Other | Admitting: Physical Therapy

## 2022-12-11 NOTE — Therapy (Addendum)
OUTPATIENT PHYSICAL THERAPY FEMALE PELVIC EVALUATION   Patient Name: Ann Bruce MRN: [id number] DOB:01/04/1986, 37 y.o., female Today's Date: 12/12/2022  END OF SESSION:  PT End of Session - 12/12/22 0938     Visit Number 1    Date for PT Re-Evaluation 03/06/23    Authorization Type [employer] medicaid    Authorization - Number of Visits 27    PT Start Time 0930    PT Stop Time 1015    PT Time Calculation (min) 45 min    Activity Tolerance Patient tolerated treatment well    Behavior During Therapy WFL for tasks assessed/performed             Past Medical History:  Diagnosis Date   Dyspnea    with pregnancy - exertion   Hypertension    Ovarian cyst    UTI (urinary tract infection)    Past Surgical History:  Procedure Laterality Date   APPENDECTOMY     LAPAROSCOPIC BILATERAL SALPINGECTOMY Bilateral 08/21/2022   Procedure: LAPAROSCOPIC BILATERAL SALPINGECTOMY AND RIGHT OVARIAN CYSTECTOMY;  Surgeon: Lorriane Shire, MD;  Location: [facility];  Service: Gynecology;  Laterality: Bilateral;   LAPAROSCOPIC TUBAL LIGATION Bilateral 03/22/2020   Procedure: LAPAROSCOPIC TUBAL LIGATION WITH FILSHIE CLIPS;  Surgeon: Hermina Staggers, MD;  Location: [facility];  Service: Gynecology;  Laterality: Bilateral;   LAPAROTOMY Bilateral 08/04/2019   Procedure: EXPLORATORY LAPAROTOMY WITH BILATERAL OVARIAN CYSTECTOMY;  Surgeon: Tereso Newcomer, MD;  Location: [facility];  Service: Gynecology;  Laterality: Bilateral;  Dr. Macon Large will check fetal heart tones before and after surgery   OVARIAN CYST SURGERY  2009   again in 2021 cyst removed   Patient Active Problem List   Diagnosis Date Noted   Admission for sterilization 08/29/2022   Post-operative state 04/24/2020   History of tubal ligation 04/24/2020    PCP: Grayce Sessions, NP  REFERRING PROVIDER: Lorriane Shire, MD   REFERRING DIAG: N39.3 (ICD-10-CM) - SUI (stress urinary incontinence, female)   THERAPY DIAG:   No diagnosis found.  Rationale for Evaluation and Treatment: Rehabilitation  ONSET DATE: 2/22  SUBJECTIVE:                                                                                                                                                                                           SUBJECTIVE STATEMENT: status post laparoscopy for adnexal mass and requested sterilization 08/21/22 (right salpingectomy, right ovarian cystectomy, left partial salpingectomy )Patient started to have urinary leakage after her 3rd child 2 years ago.  Fluid intake: Yes: water and juice    PAIN:  Are you having pain? Yes  NPRS scale: 5/10 Pain location:  suprapubic  Pain type: heaviness Pain description: intermittent   Aggravating factors: when bladder is full Relieving factors: after she empties her bladder  PRECAUTIONS: None  RED FLAGS: None, Bowel or bladder incontinence: No, Spinal tumors: No, Cauda equina syndrome: No, Compression fracture: No, and Abdominal aneurysm: No   WEIGHT BEARING RESTRICTIONS: No  FALLS:  Has patient fallen in last 6 months? No  LIVING ENVIRONMENT: Lives with: lives with their family  OCCUPATION: CNA  PLOF: Independent  PATIENT GOALS: strengthen her muscles to reduce the urinary leakage.   PERTINENT HISTORY:  Appendectomy; Laparoscopic bilateral salpingectomy 08/21/22; Laparoscopic tubal liation 03/22/20; Laparotomy with bilateral ovarian cystectomy 08/04/19 Sexual abuse: No  BOWEL MOVEMENT: no issues  URINATION: Pain with urination: No Fully empty bladder: Yes: sometimes does not feel like enough urine does not come out and like there is still urine needs to come out Stream: Strong Urgency: Yes:   Frequency: every hour in the morning and takes a blood pressure and water pill medication; night is 1 time Leakage: Urge to void, Walking to the bathroom, Laughing, and Intercourse Pads: No  INTERCOURSE: Presently not active Pain with intercourse:   none  PREGNANCY: Vaginal deliveries 3 Tearing Yes:        OBJECTIVE:   DIAGNOSTIC FINDINGS:  Laparoscopically: normal upper abdominal survey with adhesions of the omentum under the umbilicus, normal sized uterus with slight hyperemia, normal appearing right fallopian tube with filshi clip, bilobed right ovary with 3cm cystic lesion on the ovary, slightly enlarged left ovary with 3mm ovairan cyst, edematous distal left fallopian tube with dense adhesions involving the side wall and IP ligament, scar from round ligament to the anterior cul de sac, normal posterior cul de sac   PATIENT SURVEYS:  PFIQ-7 22  COGNITION: Overall cognitive status: Within functional limits for tasks assessed     SENSATION: Light touch: Appears intact Proprioception: Appears intact   POSTURE: No Significant postural limitations  PELVIC ALIGNMENT: ASIS are equal  LUMBARAROM/PROM:  A/PROM A/PROM  eval  Flexion full  Extension Decreased by 25%  Right lateral flexion Decreased by 25%  Left lateral flexion Decreased by 25%  Right rotation Decreased by 25%  Left rotation Decreased by 25%   (Blank rows = not tested)  LOWER EXTREMITY ROM: Full bilateral hip ROM   LOWER EXTREMITY MMT:  MMT Right eval Left eval  Hip extension 4/5 4/5  Hip abduction 4-/5 4+/5  Hip adduction 4+/5 4+/5   PALPATION:   General  tightness in the diaphragm and tenderness; will raise her rib cage to contract the abdominals, suprapubic scar d keloid and decreased mobility, scar that is vertical in lower abdomen has decreased mobility                External Perineal Exam not assessed                             Internal Pelvic Floor not assessed  Patient confirms identification and approves PT to assess internal pelvic floor and treatment No, Patient will next visit due to being on her cycle today  PELVIC MMT:   MMT eval  Vaginal   (Blank rows = not tested)        TONE: Not assessed  PROLAPSE: Not  assessed  TODAY'S TREATMENT:  DATE: 12/12/22  EVAL see below   PATIENT EDUCATION:  12/12/22 Education details: Access Code: [email or web address], abdominal scar massage Person educated: Patient Education method: Explanation, Demonstration, Tactile cues, Verbal cues, and Handouts Education comprehension: verbalized understanding, returned demonstration, verbal cues required, tactile cues required, and needs further education  HOME EXERCISE PROGRAM: 12/12/22 Access Code: [email or web address] URL: [email or web address] Date: 12/12/2022 Prepared by: Eulis Foster  Exercises - Supine Single Knee to Chest Stretch  - 1 x daily - 7 x weekly - 1 sets - 2 reps - 30 sec hold  ASSESSMENT:  CLINICAL IMPRESSION: Patient is a 37 y.o. female who was seen today for physical therapy evaluation and treatment for stress incontinence. Patient has had urinary leakage for 2 years since her last child was born. She will leak with urge to void, walking to the bathroom, laughing, and intercourse. Patient has to go to the bathroom once she has the urge to urinate. She has to always know where there is a bathroom when doing activities due to the frequent urge to urinate and leakage. Pelvic floor strength was not assessed at this time due to patient on her cycle. Patient reports bladder pain when full of urine at level 5/10 and reduce once she urinates. She has abdominal scars suprapubically and vertical in lower abdomen that are restricted. She has tenderness in the diaphragm and decreased mobility of the diaphragm and lower rib cage. Lumbar ROM is decreased by 25%. She is s/p  Laparoscopic bilateral salpingectomy 08/21/22 with adhesions noted on the left fallopian tube. Patient would benefit from skilled therapy to improve continence and reduce pain.   OBJECTIVE IMPAIRMENTS: decreased coordination, decreased  ROM, decreased strength, increased fascial restrictions, and pain.   ACTIVITY LIMITATIONS: continence and locomotion level  PARTICIPATION LIMITATIONS: interpersonal relationship, shopping, and community activity  PERSONAL FACTORS: Time since onset of injury/illness/exacerbation and 3+ comorbidities: Appendectomy; Laparoscopic bilateral salpingectomy 08/21/22; Laparoscopic tubal liation 03/22/20; Laparotomy with bilateral ovarian cystectomy 08/04/19  are also affecting patient's functional outcome.   REHAB POTENTIAL: Excellent  CLINICAL DECISION MAKING: Stable/uncomplicated  EVALUATION COMPLEXITY: Low   GOALS: Goals reviewed with patient? Yes  SHORT TERM GOALS: Target date: 01/08/23  Patient independent with initial HEP for scar massage and hip stretches.  Baseline:Not educated yet Goal status: INITIAL   LONG TERM GOALS: Target date: 03/06/23  Patient independent with advanced HEP for core and pelvic floor strength to reduce urinary leakage.  Baseline: not educated yet.  Goal status: INITIAL  2.  Improved movement of the diaphragm and lower rib cage to improve the pelvic floor mobility to reduce her urinary leakage while she laughs >/= 75%.  Baseline: leaks when she laughs.  Goal status: INITIAL  3.  Pelvic floor strength >/= 3/5 holding for 10 seconds so she is able to walk to the bathroom after the urge to urinate without leaking urine.  Baseline: leaks when she has the urge to urinate as she walks to the bathroom Goal status: INITIAL  4.  Bladder  pain when it is full decreased </= 1/10 due to the improved tissue mobility around the bladder and the suprapubic scar.  Baseline: Pain level 5/10 Goal status: INITIAL  5.  PFIQ-7 score </= 5 due to not always having to go to the bathroom during tasks or social events.  Baseline: PFIQ-7 22 Goal status: INITIAL   PLAN:  PT FREQUENCY: 1x/week  PT DURATION: 12 weeks  PLANNED INTERVENTIONS: Therapeutic exercises, Therapeutic  activity, Neuromuscular re-education, Patient/Family education, Joint mobilization, Dry Needling, Spinal  mobilization, Cryotherapy, Moist heat, scar mobilization, Taping, Ultrasound, Biofeedback, and Manual therapy  PLAN FOR NEXT SESSION: manual work to the diaphragm and lower rib cage, diaphragmatic breathing, assess the pelvic floor, hip stretches   Minda Ditto, PT  PHYSICAL THERAPY DISCHARGE SUMMARY  Visits from Start of Care: 1  Current functional level related to goals / functional outcomes: See above. Patient only attended the initial evaluation. We called patient and she informed us she was done with PT and wanted to be discharged.    Remaining deficits: See above.    Education / Equipment: HEP   Patient agrees to discharge. Patient goals were not met. Patient is being discharged due to the patient's request. Thank you for the referral.  Eulis Foster, PT 12/25/22 4:13 PM

## 2022-12-12 ENCOUNTER — Encounter: Payer: Self-pay | Admitting: Physical Therapy

## 2022-12-12 ENCOUNTER — Encounter: Payer: Medicaid Other | Attending: Obstetrics and Gynecology | Admitting: Physical Therapy

## 2022-12-12 ENCOUNTER — Other Ambulatory Visit: Payer: Self-pay

## 2022-12-12 DIAGNOSIS — R102 Pelvic and perineal pain: Secondary | ICD-10-CM | POA: Diagnosis present

## 2022-12-12 DIAGNOSIS — N393 Stress incontinence (female) (male): Secondary | ICD-10-CM | POA: Diagnosis not present

## 2022-12-12 DIAGNOSIS — M6281 Muscle weakness (generalized): Secondary | ICD-10-CM | POA: Insufficient documentation

## 2022-12-26 ENCOUNTER — Encounter: Payer: Medicaid Other | Admitting: Physical Therapy

## 2022-12-30 ENCOUNTER — Ambulatory Visit (INDEPENDENT_AMBULATORY_CARE_PROVIDER_SITE_OTHER): Payer: Medicaid Other | Admitting: Primary Care

## 2022-12-30 ENCOUNTER — Other Ambulatory Visit (HOSPITAL_COMMUNITY)
Admission: RE | Admit: 2022-12-30 | Discharge: 2022-12-30 | Disposition: A | Discharge status: Home / Self Care | Payer: Medicaid Other | Source: Ambulatory Visit | Attending: Primary Care | Admitting: Primary Care

## 2022-12-30 ENCOUNTER — Encounter (INDEPENDENT_AMBULATORY_CARE_PROVIDER_SITE_OTHER): Payer: Self-pay | Admitting: Primary Care

## 2022-12-30 VITALS — BP 134/92 | HR 78 | Resp 16 | Wt 168.8 lb

## 2022-12-30 DIAGNOSIS — Z124 Encounter for screening for malignant neoplasm of cervix: Secondary | ICD-10-CM | POA: Diagnosis not present

## 2022-12-30 DIAGNOSIS — I10 Essential (primary) hypertension: Secondary | ICD-10-CM | POA: Diagnosis not present

## 2022-12-30 DIAGNOSIS — Z803 Family history of malignant neoplasm of breast: Secondary | ICD-10-CM

## 2022-12-30 NOTE — Progress Notes (Signed)
  [facility]  WELL-WOMAN PHYSICAL & PAP Patient name: Ann Bruce MRN [id number]  Date of birth: 1986-02-06 Chief Complaint:   Gynecologic Exam and Hypertension  History of Present Illness:   Ann Bruce is a 37 y.o. [phone] female being seen today for a routine well-woman exam.   CC:gyn/htn  The current method of family planning is tubal ligation.  Patient's last menstrual period was 03/02/2022. Last pap 06/14/19. Results were: abnormal  risk for HPV . Family h/o breast cancer: Yes  Family h/o colorectal cancer: No  Review of Systems:    Denies any headaches, blurred vision, fatigue, shortness of breath, chest pain, abdominal pain, abnormal vaginal discharge/itching/odor/irritation, problems with periods, bowel movements, urination, or intercourse unless otherwise stated above.  Pertinent History Reviewed:   Reviewed past medical,surgical, social and family history.  Reviewed problem list, medications and allergies.  Physical Assessment:   Vitals:   12/30/22 1027 12/30/22 1030  BP: (Abnormal) 140/92 (Abnormal) 134/92  Pulse: 78   Resp: 16   SpO2: 100%   Weight: 168 lb 12.8 oz (76.6 kg)   Body mass index is 25.67 kg/m.        Physical Examination:  General appearance - well appearing, and in no distress Mental status - alert, oriented to person, place, and time Psych:  She has a normal mood and affect Skin - warm and dry, normal color, no suspicious lesions noted Chest - effort normal, all lung fields clear to auscultation bilaterally Heart - normal rate and regular rhythm Neck:  midline trachea, no thyromegaly or nodules Breasts - breasts appear normal, no suspicious masses, no skin or nipple changes or axillary nodes Educated patient on proper self breast examination and had patient to demonstrate SBE. Abdomen - soft, nontender, nondistended, no masses or organomegaly Pelvic-VULVA: normal appearing vulva with no masses, tenderness or lesions    VAGINA: normal appearing vagina with normal color and discharge, no lesions   CERVIX: normal appearing cervix without discharge or lesions, no CMT UTERUS: uterus is felt to be normal size, shape, consistency and nontender  ADNEXA: No adnexal masses or tenderness noted. Extremities:  No swelling or varicosities noted  No results found for this or any previous visit (from the past 24 hour(s)).   Assessment & Plan:  Ann Bruce was seen today for gynecologic exam and hypertension.  Diagnoses and all orders for this visit:  Cervical cancer screening -     Cervicovaginal ancillary only -     Cytology - PAP -     HIV antibody (with reflex)  Essential hypertension DASH DIET; No salt or low sodium diet Take all medication as prescribed. Avoid smoked meats which are high in sodium content. Avoid soda which contains sodium and are high in sugar which increases your risk for diabetes.   Family history of breast cancer in mother Educated patient on proper self breast examination and had patient to demonstrate SBE.  Referred for diagnostic mammogram      Follow-up: Return in about 2 years (around 12/29/2024) for bp ck.  This note has been created with Education officer, environmental. Any transcriptional errors are unintentional.   Grayce Sessions, NP 12/30/2022, 11:10 AM

## 2023-03-06 ENCOUNTER — Other Ambulatory Visit (INDEPENDENT_AMBULATORY_CARE_PROVIDER_SITE_OTHER): Payer: Self-pay | Admitting: Primary Care

## 2023-03-06 DIAGNOSIS — I10 Essential (primary) hypertension: Secondary | ICD-10-CM

## 2023-03-06 DIAGNOSIS — Z760 Encounter for issue of repeat prescription: Secondary | ICD-10-CM

## 2023-03-06 NOTE — Telephone Encounter (Signed)
Requested medication (s) are due for refill today: yes  Requested medication (s) are on the active medication list: yes  Last refill:  04/03/22 #90/1  Future visit scheduled: no  Notes to clinic:  Unable to refill per protocol due to failed labs, no updated results.      Requested Prescriptions  Pending Prescriptions Disp Refills   atorvastatin (LIPITOR) 20 MG tablet [Pharmacy Med Name: ATORVASTATIN 20MG  TABLETS] 90 tablet     Sig: TAKE 1 TABLET(20 MG) BY MOUTH DAILY     Cardiovascular:  Antilipid - Statins Failed - 03/06/2023 10:09 AM      Failed - Lipid Panel in normal range within the last 12 months    Cholesterol, Total  Date Value Ref Range Status  04/02/2021 210 (H) 100 - 199 mg/dL Final   LDL Chol Calc (NIH)  Date Value Ref Range Status  04/02/2021 150 (H) 0 - 99 mg/dL Final   HDL  Date Value Ref Range Status  04/02/2021 45 >39 mg/dL Final   Triglycerides  Date Value Ref Range Status  04/02/2021 81 0 - 149 mg/dL Final         Passed - Patient is not pregnant      Passed - Valid encounter within last 12 months    Recent Outpatient Visits           2 months ago Cervical cancer screening   [place] [facility] Grayce Sessions, NP   3 months ago Frequency of urination and polyuria   [place] [facility] Grayce Sessions, NP   6 months ago Bumps on skin   [place] [facility] Grayce Sessions, NP   9 months ago Post-operative state   [place] [facility] Grayce Sessions, NP   11 months ago Insomnia, unspecified type    [facility] Grayce Sessions, NP               losartan-hydrochlorothiazide (HYZAAR) 100-25 MG tablet [Pharmacy Med Name: LOSARTAN/HCTZ 100/25MG  TABLETS] 90 tablet 1    Sig: Take 1 tablet by mouth daily.     Cardiovascular: ARB + Diuretic Combos Failed - 03/06/2023 10:09 AM      Failed - K in normal range and within  180 days    Potassium  Date Value Ref Range Status  08/14/2022 3.3 (L) 3.5 - 5.1 mmol/L Final         Failed - Na in normal range and within 180 days    Sodium  Date Value Ref Range Status  08/14/2022 139 135 - 145 mmol/L Final  04/02/2021 141 134 - 144 mmol/L Final         Failed - Cr in normal range and within 180 days    Creatinine, Ser  Date Value Ref Range Status  08/14/2022 0.75 0.44 - 1.00 mg/dL Final   Creatinine, Urine  Date Value Ref Range Status  12/24/2019 38.98 mg/dL Final         Failed - eGFR is 10 or above and within 180 days    GFR calc Af Amer  Date Value Ref Range Status  12/24/2019 >60 >60 mL/min Final   GFR, Estimated  Date Value Ref Range Status  08/14/2022 >60 >60 mL/min Final    Comment:    (NOTE) Calculated using the CKD-EPI Creatinine Equation (2021)    eGFR  Date Value Ref Range Status  04/02/2021 111 >59 mL/min/1.73 Final  Failed - Last BP in normal range    BP Readings from Last 1 Encounters:  12/30/22 (!) 134/92         Passed - Patient is not pregnant      Passed - Valid encounter within last 6 months    Recent Outpatient Visits           2 months ago Cervical cancer screening   [place] [facility] Grayce Sessions, NP   3 months ago Frequency of urination and polyuria   [place] [facility] Grayce Sessions, NP   6 months ago Bumps on skin   [place] [facility] Grayce Sessions, NP   9 months ago Post-operative state   [place] [facility] Grayce Sessions, NP   11 months ago Insomnia, unspecified type   [place] [facility] Grayce Sessions, NP

## 2023-03-07 NOTE — Telephone Encounter (Signed)
Will forward to provider  

## 2023-03-10 ENCOUNTER — Telehealth (INDEPENDENT_AMBULATORY_CARE_PROVIDER_SITE_OTHER): Payer: Self-pay | Admitting: Primary Care

## 2023-03-10 NOTE — Telephone Encounter (Signed)
Pt. Given labs from 12/30/22. Verbalizes understanding.

## 2023-03-19 ENCOUNTER — Encounter (INDEPENDENT_AMBULATORY_CARE_PROVIDER_SITE_OTHER): Payer: Self-pay | Admitting: Primary Care

## 2023-03-19 ENCOUNTER — Ambulatory Visit (INDEPENDENT_AMBULATORY_CARE_PROVIDER_SITE_OTHER): Payer: Medicaid Other | Admitting: Primary Care

## 2023-03-19 VITALS — BP 131/86 | HR 83 | Resp 16 | Ht 68.0 in | Wt 169.2 lb

## 2023-03-19 DIAGNOSIS — Z0000 Encounter for general adult medical examination without abnormal findings: Secondary | ICD-10-CM | POA: Diagnosis not present

## 2023-03-19 DIAGNOSIS — R41840 Attention and concentration deficit: Secondary | ICD-10-CM

## 2023-03-19 DIAGNOSIS — I10 Essential (primary) hypertension: Secondary | ICD-10-CM

## 2023-03-19 DIAGNOSIS — E876 Hypokalemia: Secondary | ICD-10-CM

## 2023-03-19 DIAGNOSIS — Z2821 Immunization not carried out because of patient refusal: Secondary | ICD-10-CM

## 2023-03-19 DIAGNOSIS — N920 Excessive and frequent menstruation with regular cycle: Secondary | ICD-10-CM

## 2023-03-19 DIAGNOSIS — Z760 Encounter for issue of repeat prescription: Secondary | ICD-10-CM

## 2023-03-19 DIAGNOSIS — E782 Mixed hyperlipidemia: Secondary | ICD-10-CM

## 2023-03-19 MED ORDER — LOSARTAN POTASSIUM-HCTZ 100-25 MG PO TABS: 1.0000 | ORAL_TABLET | Freq: Every day | ORAL | 1 refills | Status: DC

## 2023-03-19 MED ORDER — AMLODIPINE BESYLATE 10 MG PO TABS: ORAL_TABLET | ORAL | 1 refills | Status: DC

## 2023-03-19 NOTE — Progress Notes (Signed)
[facility]  Ann Bruce is a 37 y.o. female presents to office today for annual physical exam examination.    Concerns today include: 1. Difficulty concentrating sometimes in a fog she is a mother of a autistic son   Occupation: CMA, Marital status: D, Substance use: none Diet: none, Exercise: walks 4-5 weekly  Health Maintenance  Topic Date Due   COVID-19 Vaccine (1) Never done   INFLUENZA VACCINE  12/26/2022   Cervical Cancer Screening (HPV/Pap Cotest)  12/30/2027   DTaP/Tdap/Td (2 - Td or Tdap) 10/19/2029   Hepatitis C Screening  Completed   HIV Screening  Completed   HPV VACCINES  Aged Out     Past Medical History:  Diagnosis Date   Dyspnea    with pregnancy - exertion   Hypertension    Ovarian cyst    UTI (urinary tract infection)    Social History   Socioeconomic History   Marital status: Single    Spouse name: Not on file   Number of children: 3   Years of education: Not on file   Highest education level: Some college, no degree  Occupational History   Not on file  Tobacco Use   Smoking status: Every Day    Current packs/day: 0.25    Average packs/day: 0.3 packs/day for 15.0 years (3.8 ttl pk-yrs)    Types: Cigarettes   Smokeless tobacco: Never   Tobacco comments:    smokes every 3 days  or when stresses, trying to quit  Vaping Use   Vaping status: Never Used  Substance and Sexual Activity   Alcohol use: Not Currently    Comment: occasional wine    Drug use: Never   Sexual activity: Not Currently    Birth control/protection: Surgical    Comment: BTL  Other Topics Concern   Not on file  Social History Narrative   Not on file   Social Determinants of Health   Financial Resource Strain: Not on file  Food Insecurity: Patient Declined (09/24/2022)   Hunger Vital Sign    Worried About Running Out of Food in the Last Year: Patient declined    Ran Out of Food in the Last Year: Patient declined  Transportation Needs:  Patient Declined (09/24/2022)   PRAPARE - Administrator, Civil Service (Medical): Patient declined    Lack of Transportation (Non-Medical): Patient declined  Physical Activity: Not on file  Stress: Not on file  Social Connections: Not on file  Intimate Partner Violence: Not on file   Past Surgical History:  Procedure Laterality Date   APPENDECTOMY     LAPAROSCOPIC BILATERAL SALPINGECTOMY Bilateral 08/21/2022   Procedure: LAPAROSCOPIC BILATERAL SALPINGECTOMY AND RIGHT OVARIAN CYSTECTOMY;  Surgeon: Lorriane Shire, MD;  Location: [facility] OR;  Service: Gynecology;  Laterality: Bilateral;   LAPAROSCOPIC TUBAL LIGATION Bilateral 03/22/2020   Procedure: LAPAROSCOPIC TUBAL LIGATION WITH FILSHIE CLIPS;  Surgeon: Hermina Staggers, MD;  Location: [facility];  Service: Gynecology;  Laterality: Bilateral;   LAPAROTOMY Bilateral 08/04/2019   Procedure: EXPLORATORY LAPAROTOMY WITH BILATERAL OVARIAN CYSTECTOMY;  Surgeon: Tereso Newcomer, MD;  Location: [facility] OR;  Service: Gynecology;  Laterality: Bilateral;  Dr. Macon Large will check fetal heart tones before and after surgery   OVARIAN CYST SURGERY  2009   again in 2021 cyst removed   Family History  Problem Relation Age of Onset   Hypertension Mother    Breast cancer Mother    Hypertension Father  Current Outpatient Medications:    acetaminophen (TYLENOL) 500 MG tablet, Take 1 tablet (500 mg total) by mouth every 6 (six) hours as needed. (Patient not taking: Reported on 09/24/2022), Disp: 90 tablet, Rfl: 1   amLODipine (NORVASC) 10 MG tablet, TAKE 1 TABLET(10 MG) BY MOUTH DAILY, Disp: 90 tablet, Rfl: 1   atorvastatin (LIPITOR) 20 MG tablet, Take 20 mg by mouth daily., Disp: , Rfl:    ibuprofen (ADVIL) 600 MG tablet, Take 1 tablet (600 mg total) by mouth every 6 (six) hours as needed. (Patient not taking: Reported on 09/24/2022), Disp: 90 tablet, Rfl: 1   losartan-hydrochlorothiazide (HYZAAR) 100-25 MG tablet, Take 1 tablet by  mouth daily. (Patient not taking: Reported on 09/24/2022), Disp: 90 tablet, Rfl: 1   oxyCODONE (OXY IR/ROXICODONE) 5 MG immediate release tablet, Take 1 tablet (5 mg total) by mouth every 4 (four) hours as needed for severe pain or breakthrough pain. (Patient not taking: Reported on 09/24/2022), Disp: 12 tablet, Rfl: 0   polyethylene glycol (MIRALAX / GLYCOLAX) 17 g packet, Take 17 g by mouth daily. (Patient not taking: Reported on 09/24/2022), Disp: 14 each, Rfl: 0   traZODone (DESYREL) 50 MG tablet, Take 0.5-2 tablets (25-100 mg total) by mouth at bedtime. For sleep, Disp: 90 tablet, Rfl: 1 Outpatient Encounter Medications as of 03/19/2023  Medication Sig   acetaminophen (TYLENOL) 500 MG tablet Take 1 tablet (500 mg total) by mouth every 6 (six) hours as needed. (Patient not taking: Reported on 09/24/2022)   amLODipine (NORVASC) 10 MG tablet TAKE 1 TABLET(10 MG) BY MOUTH DAILY   atorvastatin (LIPITOR) 20 MG tablet Take 20 mg by mouth daily.   ibuprofen (ADVIL) 600 MG tablet Take 1 tablet (600 mg total) by mouth every 6 (six) hours as needed. (Patient not taking: Reported on 09/24/2022)   losartan-hydrochlorothiazide (HYZAAR) 100-25 MG tablet Take 1 tablet by mouth daily. (Patient not taking: Reported on 09/24/2022)   oxyCODONE (OXY IR/ROXICODONE) 5 MG immediate release tablet Take 1 tablet (5 mg total) by mouth every 4 (four) hours as needed for severe pain or breakthrough pain. (Patient not taking: Reported on 09/24/2022)   polyethylene glycol (MIRALAX / GLYCOLAX) 17 g packet Take 17 g by mouth daily. (Patient not taking: Reported on 09/24/2022)   traZODone (DESYREL) 50 MG tablet Take 0.5-2 tablets (25-100 mg total) by mouth at bedtime. For sleep   No facility-administered encounter medications on file as of 03/19/2023.    No Known Allergies  Health Maintenance  Topic Date Due   COVID-19 Vaccine (1) 04/04/2023 (Originally 01/02/1991)   INFLUENZA VACCINE  08/25/2023 (Originally 12/26/2022)   Cervical  Cancer Screening (HPV/Pap Cotest)  12/30/2027   DTaP/Tdap/Td (2 - Td or Tdap) 10/19/2029   Hepatitis C Screening  Completed   HIV Screening  Completed   HPV VACCINES  Aged Out    ROS: Review of Systems Pertinent items noted in HPI and remainder of comprehensive ROS otherwise negative.    Physical exam BP 131/86 (BP Location: Left Arm, Patient Position: Sitting, Cuff Size: Normal)   Pulse 83   Resp 16   Ht 5\' 8"  (1.727 m)   Wt 169 lb 3.2 oz (76.7 kg)   SpO2 100%   BMI 25.73 kg/m  General appearance: alert, cooperative, and appears older than stated age Head: Normocephalic, without obvious abnormality, atraumatic Ears: normal TM's and external ear canals both ears Neck: no adenopathy, no carotid bruit, no JVD, supple, symmetrical, trachea midline, and thyroid not enlarged, symmetric, no tenderness/mass/nodules  Back: symmetric, no curvature. ROM normal. No CVA tenderness. Lungs: clear to auscultation bilaterally Heart: regular rate and rhythm, S1, S2 normal, no murmur, click, rub or gallop Abdomen: soft, non-tender; bowel sounds normal; no masses,  no organomegaly Extremities: extremities normal, atraumatic, no cyanosis or edema Pulses: 2+ and symmetric Skin: Skin color, texture, turgor normal. No rashes or lesions Lymph nodes: Cervical, supraclavicular, and axillary nodes normal. Neurologic: Alert and oriented X 3, normal strength and tone. Normal symmetric reflexes. Normal coordination and gait    Assessment/ Plan: ARGIRO BOUNDY here for annual physical exam.  Kemely was seen today for hypertension.  Diagnoses and all orders for this visit:  Annual physical exam  Influenza vaccination declined  Essential hypertension -     amLODipine (NORVASC) 10 MG tablet; TAKE 1 TABLET(10 MG) BY MOUTH DAILY -     losartan-hydrochlorothiazide (HYZAAR) 100-25 MG tablet; Take 1 tablet by mouth daily. -     CMP14+EGFR  Concentration deficit -     Cancel: Ambulatory referral to  Psychiatry -     Ambulatory referral to Psychiatry  Medication refill -     amLODipine (NORVASC) 10 MG tablet; TAKE 1 TABLET(10 MG) BY MOUTH DAILY -     losartan-hydrochlorothiazide (HYZAAR) 100-25 MG tablet; Take 1 tablet by mouth daily.  Hypokalemia -     CMP14+EGFR  Mixed hyperlipidemia -     Lipid Panel  Menorrhagia with regular cycle -     CBC with Differential      Counseled on healthy lifestyle choices, including diet (rich in fruits, vegetables and lean meats and low in salt and simple carbohydrates) and exercise (at least 30 minutes of moderate physical activity daily).  Patient to follow up in 1 year for annual exam or sooner if needed.  The above assessment and management plan was discussed with the patient. The patient verbalized understanding of and has agreed to the management plan. Patient is aware to call the clinic if symptoms persist or worsen. Patient is aware when to return to the clinic for a follow-up visit. Patient educated on when it is appropriate to go to the emergency department.   This note has been created with Education officer, environmental. Any transcriptional errors are unintentional.   Grayce Sessions, NP 03/19/2023, 8:41 AM

## 2023-03-20 LAB — CMP14+EGFR
ALT: 12 [IU]/L (ref 0–32)
AST: 13 [IU]/L (ref 0–40)
Albumin: 4.4 g/dL (ref 3.9–4.9)
Alkaline Phosphatase: 49 [IU]/L (ref 44–121)
BUN/Creatinine Ratio: 17 (ref 9–23)
BUN: 12 mg/dL (ref 6–20)
Bilirubin Total: 0.4 mg/dL (ref 0.0–1.2)
CO2: 23 mmol/L (ref 20–29)
Calcium: 8.7 mg/dL (ref 8.7–10.2)
Chloride: 103 mmol/L (ref 96–106)
Creatinine, Ser: 0.71 mg/dL (ref 0.57–1.00)
Globulin, Total: 2.4 g/dL (ref 1.5–4.5)
Glucose: 82 mg/dL (ref 70–99)
Potassium: 3.9 mmol/L (ref 3.5–5.2)
Sodium: 140 mmol/L (ref 134–144)
Total Protein: 6.8 g/dL (ref 6.0–8.5)
eGFR: 112 mL/min/{1.73_m2} (ref >59)

## 2023-03-20 LAB — LIPID PANEL
Chol/HDL Ratio: 3.7 ratio (ref 0.0–4.4)
Cholesterol, Total: 155 mg/dL (ref 100–199)
HDL: 42 mg/dL (ref >39)
LDL Chol Calc (NIH): 95 mg/dL (ref 0–99)
Triglycerides: 97 mg/dL (ref 0–149)
VLDL Cholesterol Cal: 18 mg/dL (ref 5–40)

## 2023-03-20 LAB — CBC WITH DIFFERENTIAL/PLATELET
Basophils Absolute: 0 10*3/uL (ref 0.0–0.2)
Basos: 1 %
EOS (ABSOLUTE): 0 10*3/uL (ref 0.0–0.4)
Eos: 1 %
Hematocrit: 39.4 % (ref 34.0–46.6)
Hemoglobin: 12.7 g/dL (ref 11.1–15.9)
Immature Grans (Abs): 0 10*3/uL (ref 0.0–0.1)
Immature Granulocytes: 1 %
Lymphocytes Absolute: 1.6 10*3/uL (ref 0.7–3.1)
Lymphs: 43 %
MCH: 30.5 pg (ref 26.6–33.0)
MCHC: 32.2 g/dL (ref 31.5–35.7)
MCV: 95 fL (ref 79–97)
Monocytes Absolute: 0.4 10*3/uL (ref 0.1–0.9)
Monocytes: 10 %
Neutrophils Absolute: 1.6 10*3/uL (ref 1.4–7.0)
Neutrophils: 44 %
Platelets: 222 10*3/uL (ref 150–450)
RBC: 4.16 x10E6/uL (ref 3.77–5.28)
RDW: 12.5 % (ref 11.7–15.4)
WBC: 3.6 10*3/uL (ref 3.4–10.8)

## 2023-06-19 ENCOUNTER — Ambulatory Visit (INDEPENDENT_AMBULATORY_CARE_PROVIDER_SITE_OTHER): Payer: Medicaid Other | Admitting: Primary Care

## 2023-06-19 ENCOUNTER — Encounter (INDEPENDENT_AMBULATORY_CARE_PROVIDER_SITE_OTHER): Payer: Self-pay | Admitting: Primary Care

## 2023-06-19 VITALS — BP 132/85 | HR 87 | Resp 16 | Ht 68.0 in | Wt 162.2 lb

## 2023-06-19 DIAGNOSIS — Z2821 Immunization not carried out because of patient refusal: Secondary | ICD-10-CM | POA: Diagnosis not present

## 2023-06-19 DIAGNOSIS — I10 Essential (primary) hypertension: Secondary | ICD-10-CM

## 2023-06-19 DIAGNOSIS — Z760 Encounter for issue of repeat prescription: Secondary | ICD-10-CM

## 2023-06-19 DIAGNOSIS — G4700 Insomnia, unspecified: Secondary | ICD-10-CM | POA: Diagnosis not present

## 2023-06-19 MED ORDER — AMLODIPINE BESYLATE 10 MG PO TABS: ORAL_TABLET | ORAL | 1 refills | Status: DC

## 2023-06-19 MED ORDER — TRAZODONE HCL 50 MG PO TABS: 25.0000 mg | ORAL_TABLET | Freq: Every day | ORAL | 1 refills | Status: DC

## 2023-06-19 MED ORDER — LOSARTAN POTASSIUM-HCTZ 100-25 MG PO TABS: 1.0000 | ORAL_TABLET | Freq: Every day | ORAL | 1 refills | Status: DC

## 2023-06-19 NOTE — Progress Notes (Signed)
[facility]  Ann Bruce, is a 38 y.o. female  [id number]  [id number]  DOB - [birth year]  Chief Complaint  Patient presents with   Hypertension       Subjective:   [name] is a 38 y.o. female here today for a follow up visit HTN. Patient has No headache, No chest pain, No abdominal pain - No Nausea, No new weakness tingling or numbness, No Cough - shortness of breath. She c/o insomnia difficulty falling to sleep and night time waking.  Requesting medication refills.   No problems updated.  Comprehensive ROS Pertinent positive and negative noted in HPI   No Known Allergies  Past Medical History:  Diagnosis Date   Dyspnea    with pregnancy - exertion   Hypertension    Ovarian cyst    UTI (urinary tract infection)     Current Outpatient Medications on File Prior to Visit  Medication Sig Dispense Refill   amLODipine (NORVASC) 10 MG tablet TAKE 1 TABLET(10 MG) BY MOUTH DAILY 90 tablet 1   atorvastatin (LIPITOR) 20 MG tablet Take 20 mg by mouth daily.     losartan-hydrochlorothiazide (HYZAAR) 100-25 MG tablet Take 1 tablet by mouth daily. 90 tablet 1   traZODone (DESYREL) 50 MG tablet Take 0.5-2 tablets (25-100 mg total) by mouth at bedtime. For sleep 90 tablet 1   No current facility-administered medications on file prior to visit.   Health Maintenance  Topic Date Due   COVID-19 Vaccine (1) Never done   Flu Shot  08/25/2023*   Pneumococcal Vaccination (1 of 2 - PCV) 06/18/2024*   Pap with HPV screening  12/30/2027   DTaP/Tdap/Td vaccine (2 - Td or Tdap) 10/19/2029   Hepatitis C Screening  Completed   HIV Screening  Completed   HPV Vaccine  Aged Out  *Topic was postponed. The date shown is not the original due date.    Objective:   Vitals:   06/19/23 0841  BP: 132/85  Pulse: 87  Resp: 16  SpO2: 100%  Weight: 162 lb 3.2 oz (73.6 kg)  Height: 5\' 8"  (1.727 m)   BP Readings from Last 3 Encounters:  06/19/23 132/85  03/19/23  131/86  12/30/22 (!) 134/92      Physical Exam Vitals reviewed.  Constitutional:      Appearance: Normal appearance.  HENT:     Head: Normocephalic.     Right Ear: Tympanic membrane, ear canal and external ear normal.     Left Ear: Tympanic membrane, ear canal and external ear normal.     Nose: Nose normal.     Mouth/Throat:     Mouth: Mucous membranes are moist.  Eyes:     Extraocular Movements: Extraocular movements intact.     Pupils: Pupils are equal, round, and reactive to light.  Cardiovascular:     Rate and Rhythm: Normal rate.  Pulmonary:     Effort: Pulmonary effort is normal.     Breath sounds: Normal breath sounds.  Abdominal:     General: Bowel sounds are normal.     Palpations: Abdomen is soft.  Musculoskeletal:        General: Normal range of motion.     Cervical back: Normal range of motion.  Skin:    General: Skin is warm and dry.  Neurological:     Mental Status: She is alert and oriented to person, place, and time.  Psychiatric:        Mood and Affect: Mood normal.  Behavior: Behavior normal.        Thought Content: Thought content normal.      Assessment & Plan  Essential hypertension Bp stable on current medication regiment .  BP goal - < 130/80 Explained that having normal blood pressure is the goal and medications are helping to get to goal and maintain normal blood pressure. DIET: Limit salt intake, read nutrition labels to check salt content, limit fried and high fatty foods  Avoid using multisymptom OTC cold preparations that generally contain sudafed which can rise BP. Consult with pharmacist on best cold relief products to use for persons with HTN EXERCISE Discussed incorporating exercise such as walking - 30 minutes most days of the week and can do in 10 minute intervals    -     CBC with Differential/Platelet -     CMP14+EGFR  -     amLODipine (NORVASC) 10 MG tablet; TAKE 1 TABLET(10 MG) BY MOUTH DAILY -      losartan-hydrochlorothiazide (HYZAAR) 100-25 MG tablet; Take 1 tablet by mouth daily.  Pneumococcal vaccination declined  Medication refill -     amLODipine (NORVASC) 10 MG tablet; TAKE 1 TABLET(10 MG) BY MOUTH DAILY -     losartan-hydrochlorothiazide (HYZAAR) 100-25 MG tablet; Take 1 tablet by mouth daily. -     traZODone (DESYREL) 50 MG tablet; Take 0.5-2 tablets (25-100 mg total) by mouth at bedtime. For sleep  Insomnia, unspecified type -     traZODone (DESYREL) 50 MG tablet; Take 0.5-2 tablets (25-100 mg total) by mouth at bedtime. For sleep    Patient have been counseled extensively about nutrition and exercise. Other issues discussed during this visit include: low cholesterol diet, weight control and daily exercise, foot care, annual eye examinations at Ophthalmology, importance of adherence with medications and regular follow-up. We also discussed long term complications of uncontrolled diabetes and hypertension.   Return in about 6 months (around 12/17/2023).  The patient was given clear instructions to go to ER or return to medical center if symptoms don't improve, worsen or new problems develop. The patient verbalized understanding. The patient was told to call to get lab results if they haven't heard anything in the next week.   This note has been created with Education officer, environmental. Any transcriptional errors are unintentional.   Grayce Sessions, NP 06/19/2023, 9:09 AM

## 2023-06-20 ENCOUNTER — Other Ambulatory Visit (INDEPENDENT_AMBULATORY_CARE_PROVIDER_SITE_OTHER): Payer: Self-pay | Admitting: Primary Care

## 2023-06-20 ENCOUNTER — Encounter (INDEPENDENT_AMBULATORY_CARE_PROVIDER_SITE_OTHER): Payer: Self-pay | Admitting: Primary Care

## 2023-06-20 LAB — CBC WITH DIFFERENTIAL/PLATELET
Basophils Absolute: 0 10*3/uL (ref 0.0–0.2)
Basos: 1 %
EOS (ABSOLUTE): 0 10*3/uL (ref 0.0–0.4)
Eos: 1 %
Hematocrit: 41.2 % (ref 34.0–46.6)
Hemoglobin: 13.8 g/dL (ref 11.1–15.9)
Immature Grans (Abs): 0 10*3/uL (ref 0.0–0.1)
Immature Granulocytes: 0 %
Lymphocytes Absolute: 1.3 10*3/uL (ref 0.7–3.1)
Lymphs: 34 %
MCH: 31 pg (ref 26.6–33.0)
MCHC: 33.5 g/dL (ref 31.5–35.7)
MCV: 93 fL (ref 79–97)
Monocytes Absolute: 0.4 10*3/uL (ref 0.1–0.9)
Monocytes: 12 %
Neutrophils Absolute: 2 10*3/uL (ref 1.4–7.0)
Neutrophils: 52 %
Platelets: 226 10*3/uL (ref 150–450)
RBC: 4.45 x10E6/uL (ref 3.77–5.28)
RDW: 11.8 % (ref 11.7–15.4)
WBC: 3.7 10*3/uL (ref 3.4–10.8)

## 2023-06-20 LAB — CMP14+EGFR
ALT: 17 [IU]/L (ref 0–32)
AST: 20 [IU]/L (ref 0–40)
Albumin: 4.5 g/dL (ref 3.9–4.9)
Alkaline Phosphatase: 64 [IU]/L (ref 44–121)
BUN/Creatinine Ratio: 14 (ref 9–23)
BUN: 11 mg/dL (ref 6–20)
Bilirubin Total: 0.4 mg/dL (ref 0.0–1.2)
CO2: 25 mmol/L (ref 20–29)
Calcium: 9.6 mg/dL (ref 8.7–10.2)
Chloride: 97 mmol/L (ref 96–106)
Creatinine, Ser: 0.77 mg/dL (ref 0.57–1.00)
Globulin, Total: 2.9 g/dL (ref 1.5–4.5)
Glucose: 76 mg/dL (ref 70–99)
Potassium: 3.1 mmol/L — ABNORMAL LOW (ref 3.5–5.2)
Sodium: 140 mmol/L (ref 134–144)
Total Protein: 7.4 g/dL (ref 6.0–8.5)
eGFR: 101 mL/min/{1.73_m2} (ref >59)

## 2023-06-20 MED ORDER — POTASSIUM CHLORIDE CRYS ER 10 MEQ PO TBCR: 10.0000 meq | EXTENDED_RELEASE_TABLET | Freq: Every day | ORAL | 0 refills | Status: DC

## 2023-09-23 ENCOUNTER — Ambulatory Visit (INDEPENDENT_AMBULATORY_CARE_PROVIDER_SITE_OTHER): Payer: Medicaid Other | Admitting: Primary Care

## 2023-12-11 ENCOUNTER — Other Ambulatory Visit (HOSPITAL_COMMUNITY)
Admission: RE | Admit: 2023-12-11 | Discharge: 2023-12-11 | Disposition: A | Discharge status: Home / Self Care | Source: Ambulatory Visit | Attending: Primary Care | Admitting: Primary Care

## 2023-12-11 ENCOUNTER — Encounter (INDEPENDENT_AMBULATORY_CARE_PROVIDER_SITE_OTHER): Payer: Self-pay | Admitting: Primary Care

## 2023-12-11 ENCOUNTER — Ambulatory Visit (INDEPENDENT_AMBULATORY_CARE_PROVIDER_SITE_OTHER): Admitting: Primary Care

## 2023-12-11 VITALS — BP 128/83 | HR 92 | Resp 16 | Wt 163.0 lb

## 2023-12-11 DIAGNOSIS — R300 Dysuria: Secondary | ICD-10-CM

## 2023-12-11 DIAGNOSIS — I10 Essential (primary) hypertension: Secondary | ICD-10-CM

## 2023-12-11 DIAGNOSIS — Z113 Encounter for screening for infections with a predominantly sexual mode of transmission: Secondary | ICD-10-CM

## 2023-12-11 DIAGNOSIS — N898 Other specified noninflammatory disorders of vagina: Secondary | ICD-10-CM

## 2023-12-11 DIAGNOSIS — F4323 Adjustment disorder with mixed anxiety and depressed mood: Secondary | ICD-10-CM

## 2023-12-11 DIAGNOSIS — E782 Mixed hyperlipidemia: Secondary | ICD-10-CM

## 2023-12-11 DIAGNOSIS — Z760 Encounter for issue of repeat prescription: Secondary | ICD-10-CM

## 2023-12-11 DIAGNOSIS — G4700 Insomnia, unspecified: Secondary | ICD-10-CM

## 2023-12-11 DIAGNOSIS — Z809 Family history of malignant neoplasm, unspecified: Secondary | ICD-10-CM

## 2023-12-11 LAB — POCT URINALYSIS DIP (CLINITEK)
Bilirubin, UA: NEGATIVE
Glucose, UA: NEGATIVE mg/dL
Ketones, POC UA: NEGATIVE mg/dL
Nitrite, UA: NEGATIVE
POC PROTEIN,UA: NEGATIVE
Spec Grav, UA: 1.015 (ref 1.010–1.025)
Urobilinogen, UA: 0.2 U/dL
pH, UA: 7 (ref 5.0–8.0)

## 2023-12-11 MED ORDER — ATORVASTATIN CALCIUM 20 MG PO TABS: 20.0000 mg | ORAL_TABLET | Freq: Every day | ORAL | 1 refills | Status: DC

## 2023-12-11 MED ORDER — TRAZODONE HCL 50 MG PO TABS: 25.0000 mg | ORAL_TABLET | Freq: Every day | ORAL | 1 refills | Status: DC

## 2023-12-11 MED ORDER — LOSARTAN POTASSIUM-HCTZ 100-25 MG PO TABS: 1.0000 | ORAL_TABLET | Freq: Every day | ORAL | 1 refills | Status: DC

## 2023-12-11 MED ORDER — AMLODIPINE BESYLATE 10 MG PO TABS: ORAL_TABLET | ORAL | 1 refills | Status: DC

## 2023-12-11 MED ORDER — PHENAZOPYRIDINE HCL 100 MG PO TABS: 100.0000 mg | ORAL_TABLET | Freq: Three times a day (TID) | ORAL | 0 refills | Status: DC | PRN

## 2023-12-11 NOTE — Addendum Note (Signed)
 Addended by: CASIMIR JUVENAL SAUNDERS on: 12/11/2023 05:01 PM   Modules accepted: Orders

## 2023-12-11 NOTE — Progress Notes (Signed)
 [facility]  Aradhana Gin, is a 38 y.o. female  [id number]  [id number]  DOB - 20-Apr-1986  Chief Complaint  Patient presents with   Hypertension   Dysuria       Subjective:   Ann Bruce is a 38 y.o. female here today for an acute visit. She c/o dysuria. Management of HTN-Patient has No headache, No chest pain, No abdominal pain - No Nausea, No new weakness tingling or numbness, No Cough - shortness of breath.  Blood pressure is unremarkable 128/83 she endorses taking her medication every day..  The only problem she has is a horse pill potassium advised her to put it in applesauce or something smooth to slowly go down her throat easier.  Hypertension  Dysuria     No problems updated.  Comprehensive ROS Pertinent positive and negative noted in HPI   No Known Allergies  Past Medical History:  Diagnosis Date   Dyspnea    with pregnancy - exertion   Hypertension    Ovarian cyst    UTI (urinary tract infection)     Current Outpatient Medications on File Prior to Visit  Medication Sig Dispense Refill   amLODipine  (NORVASC ) 10 MG tablet TAKE 1 TABLET(10 MG) BY MOUTH DAILY 90 tablet 1   atorvastatin  (LIPITOR) 20 MG tablet Take 20 mg by mouth daily.     losartan -hydrochlorothiazide  (HYZAAR) 100-25 MG tablet Take 1 tablet by mouth daily. 90 tablet 1   potassium chloride  (KLOR-CON  M) 10 MEQ tablet Take 1 tablet (10 mEq total) by mouth daily. 90 tablet 0   traZODone  (DESYREL ) 50 MG tablet Take 0.5-2 tablets (25-100 mg total) by mouth at bedtime. For sleep 90 tablet 1   No current facility-administered medications on file prior to visit.   Health Maintenance  Topic Date Due   COVID-19 Vaccine (1) Never done   Hepatitis B Vaccine (1 of 3 - 19+ 3-dose series) Never done   HPV Vaccine (1 - 3-dose SCDM series) Never done   Pneumococcal Vaccination (1 of 2 - PCV) 06/18/2024*   Flu Shot  12/26/2023   Pap with HPV screening  12/30/2027   DTaP/Tdap/Td  vaccine (2 - Td or Tdap) 10/19/2029   Hepatitis C Screening  Completed   HIV Screening  Completed   Meningitis B Vaccine  Aged Out  *Topic was postponed. The date shown is not the original due date.    Objective:  BP 128/83   Pulse 92   Resp 16   Wt 163 lb (73.9 kg)   LMP 11/17/2023   SpO2 100%   BMI 24.78 kg/m     Physical Exam Vitals reviewed.  Constitutional:      Appearance: Normal appearance. She is obese.  HENT:     Head: Normocephalic.     Right Ear: External ear normal.     Left Ear: External ear normal.     Nose: Nose normal.     Mouth/Throat:     Mouth: Mucous membranes are moist.  Eyes:     Extraocular Movements: Extraocular movements intact.  Cardiovascular:     Rate and Rhythm: Normal rate and regular rhythm.  Pulmonary:     Effort: Pulmonary effort is normal.     Breath sounds: Normal breath sounds.  Abdominal:     General: Bowel sounds are normal.     Palpations: Abdomen is soft.  Musculoskeletal:        General: Normal range of motion.     Cervical back: Normal range  of motion and neck supple.  Skin:    General: Skin is warm and dry.  Neurological:     Mental Status: She is alert and oriented to person, place, and time.  Psychiatric:        Mood and Affect: Mood normal.        Behavior: Behavior normal.        Thought Content: Thought content normal.      Assessment & Plan  Ann Bruce was seen today for hypertension and dysuria.  Diagnoses and all orders for this visit:  Dysuria-     POCT URINALYSIS DIP (CLINITEK)  Essential hypertension Well controlled  DIET: Limit salt intake, read nutrition labels to check salt content, limit fried and high fatty foods  Avoid using multisymptom OTC cold preparations that generally contain sudafed which can rise BP. Consult with pharmacist on best cold relief products to use for persons with HTN EXERCISE Discussed incorporating exercise such as walking - 30 minutes most days of the week and can do in 10  minute intervals    -     CBC with Differential/Platelet -     CMP14+EGFR -     amLODipine  (NORVASC ) 10 MG tablet; TAKE 1 TABLET(10 MG) BY MOUTH DAILY -     losartan -hydrochlorothiazide  (HYZAAR) 100-25 MG tablet; Take 1 tablet by mouth daily.  Medication refill -     amLODipine  (NORVASC ) 10 MG tablet; TAKE 1 TABLET(10 MG) BY MOUTH DAILY -     losartan -hydrochlorothiazide  (HYZAAR) 100-25 MG tablet; Take 1 tablet by mouth daily. -     traZODone  (DESYREL ) 50 MG tablet; Take 0.5-2 tablets (25-100 mg total) by mouth at bedtime. For sleep  Insomnia, unspecified type -     traZODone  (DESYREL ) 50 MG tablet; Take 0.5-2 tablets (25-100 mg total) by mouth at bedtime. For sleep  Vaginal discharge -     Cervicovaginal ancillary only  Mixed hyperlipidemia -     Lipid panel -     atorvastatin  (LIPITOR) 20 MG tablet; Take 1 tablet (20 mg total) by mouth daily.  Adjustment disorder with mixed anxiety and depressed mood -     traZODone  (DESYREL ) 50 MG tablet; Take 0.5-2 tablets (25-100 mg total) by mouth at bedtime. For sleep  Screen for STD (sexually transmitted disease) -     HIV Antibody (routine testing w rflx) -     RPR -     Cervicovaginal ancillary only    Patient have been counseled extensively about nutrition and exercise. Other issues discussed during this visit include: low cholesterol diet, weight control and daily exercise, foot care, annual eye examinations at Ophthalmology, importance of adherence with medications and regular follow-up. We also discussed long term complications of uncontrolled diabetes and hypertension.   Return in about 6 months (around 06/12/2024).  The patient was given clear instructions to go to ER or return to medical center if symptoms don't improve, worsen or new problems develop. The patient verbalized understanding. The patient was told to call to get lab results if they haven't heard anything in the next week.   This note has been created with Engineer, agricultural. Any transcriptional errors are unintentional.   Ann SHAUNNA Bohr, NP 12/11/2023, 1:53 PM

## 2023-12-11 NOTE — Addendum Note (Signed)
 Addended by: CELESTIA BROWNING on: 12/11/2023 02:55 PM   Modules accepted: Orders

## 2023-12-12 ENCOUNTER — Ambulatory Visit: Payer: Self-pay | Admitting: Primary Care

## 2023-12-12 ENCOUNTER — Other Ambulatory Visit: Payer: Self-pay | Admitting: Primary Care

## 2023-12-12 LAB — HSV 1 AND 2 AB, IGG
HSV 1 Glycoprotein G Ab, IgG: NONREACTIVE
HSV 2 IgG, Type Spec: NONREACTIVE

## 2023-12-12 MED ORDER — FLUCONAZOLE 150 MG PO TABS: 150.0000 mg | ORAL_TABLET | Freq: Once | ORAL | 1 refills | Status: AC

## 2023-12-12 MED ORDER — SULFAMETHOXAZOLE-TRIMETHOPRIM 800-160 MG PO TABS: 1.0000 | ORAL_TABLET | Freq: Two times a day (BID) | ORAL | 0 refills | Status: DC

## 2023-12-13 LAB — CBC WITH DIFFERENTIAL/PLATELET
Basophils Absolute: 0 x10E3/uL (ref 0.0–0.2)
Basos: 0 %
EOS (ABSOLUTE): 0 x10E3/uL (ref 0.0–0.4)
Eos: 1 %
Hematocrit: 42.2 % (ref 34.0–46.6)
Hemoglobin: 14 g/dL (ref 11.1–15.9)
Immature Grans (Abs): 0 x10E3/uL (ref 0.0–0.1)
Immature Granulocytes: 0 %
Lymphocytes Absolute: 2.1 x10E3/uL (ref 0.7–3.1)
Lymphs: 40 %
MCH: 31.2 pg (ref 26.6–33.0)
MCHC: 33.2 g/dL (ref 31.5–35.7)
MCV: 94 fL (ref 79–97)
Monocytes Absolute: 0.3 x10E3/uL (ref 0.1–0.9)
Monocytes: 6 %
Neutrophils Absolute: 2.8 x10E3/uL (ref 1.4–7.0)
Neutrophils: 53 %
Platelets: 229 x10E3/uL (ref 150–450)
RBC: 4.49 x10E6/uL (ref 3.77–5.28)
RDW: 13 % (ref 11.7–15.4)
WBC: 5.3 x10E3/uL (ref 3.4–10.8)

## 2023-12-13 LAB — CMP14+EGFR
ALT: 12 IU/L (ref 0–32)
AST: 15 IU/L (ref 0–40)
Albumin: 4.6 g/dL (ref 3.9–4.9)
Alkaline Phosphatase: 44 IU/L (ref 44–121)
BUN/Creatinine Ratio: 24 — ABNORMAL HIGH (ref 9–23)
BUN: 19 mg/dL (ref 6–20)
Bilirubin Total: <0.2 mg/dL (ref 0.0–1.2)
CO2: 23 mmol/L (ref 20–29)
Calcium: 9.5 mg/dL (ref 8.7–10.2)
Chloride: 98 mmol/L (ref 96–106)
Creatinine, Ser: 0.78 mg/dL (ref 0.57–1.00)
Globulin, Total: 2.6 g/dL (ref 1.5–4.5)
Glucose: 96 mg/dL (ref 70–99)
Potassium: 3.4 mmol/L — ABNORMAL LOW (ref 3.5–5.2)
Sodium: 137 mmol/L (ref 134–144)
Total Protein: 7.2 g/dL (ref 6.0–8.5)
eGFR: 100 mL/min/1.73 (ref >59)

## 2023-12-13 LAB — HIV ANTIBODY (ROUTINE TESTING W REFLEX): HIV Screen 4th Generation wRfx: NONREACTIVE

## 2023-12-13 LAB — RPR: RPR Ser Ql: NONREACTIVE

## 2023-12-13 LAB — LIPID PANEL
Chol/HDL Ratio: 4.7 ratio — ABNORMAL HIGH (ref 0.0–4.4)
Cholesterol, Total: 210 mg/dL — ABNORMAL HIGH (ref 100–199)
HDL: 45 mg/dL (ref >39)
LDL Chol Calc (NIH): 130 mg/dL — ABNORMAL HIGH (ref 0–99)
Triglycerides: 197 mg/dL — ABNORMAL HIGH (ref 0–149)
VLDL Cholesterol Cal: 35 mg/dL (ref 5–40)

## 2023-12-15 LAB — CERVICOVAGINAL ANCILLARY ONLY
Bacterial Vaginitis (gardnerella): POSITIVE — AB
Candida Glabrata: NEGATIVE
Candida Vaginitis: NEGATIVE
Chlamydia: NEGATIVE
Comment: NEGATIVE
Comment: NEGATIVE
Comment: NEGATIVE
Comment: NEGATIVE
Comment: NEGATIVE
Comment: NORMAL
Neisseria Gonorrhea: NEGATIVE
Trichomonas: NEGATIVE

## 2023-12-17 ENCOUNTER — Ambulatory Visit (INDEPENDENT_AMBULATORY_CARE_PROVIDER_SITE_OTHER): Payer: Medicaid Other | Admitting: Primary Care

## 2023-12-17 NOTE — Telephone Encounter (Signed)
 Will forward to provider

## 2023-12-21 ENCOUNTER — Other Ambulatory Visit: Payer: Self-pay | Admitting: Primary Care

## 2023-12-21 DIAGNOSIS — B9689 Other specified bacterial agents as the cause of diseases classified elsewhere: Secondary | ICD-10-CM

## 2023-12-21 MED ORDER — METRONIDAZOLE 500 MG PO TABS: 500.0000 mg | ORAL_TABLET | Freq: Two times a day (BID) | ORAL | 0 refills | Status: DC

## 2023-12-21 MED ORDER — FLUCONAZOLE 150 MG PO TABS: 150.0000 mg | ORAL_TABLET | Freq: Once | ORAL | 1 refills | Status: AC

## 2024-03-10 ENCOUNTER — Ambulatory Visit: Admitting: Family Medicine

## 2024-03-24 ENCOUNTER — Other Ambulatory Visit (INDEPENDENT_AMBULATORY_CARE_PROVIDER_SITE_OTHER): Payer: Self-pay | Admitting: Primary Care

## 2024-03-24 DIAGNOSIS — E782 Mixed hyperlipidemia: Secondary | ICD-10-CM

## 2024-03-25 ENCOUNTER — Ambulatory Visit (INDEPENDENT_AMBULATORY_CARE_PROVIDER_SITE_OTHER): Admitting: Obstetrics and Gynecology

## 2024-03-25 ENCOUNTER — Other Ambulatory Visit (HOSPITAL_COMMUNITY)
Admission: RE | Admit: 2024-03-25 | Discharge: 2024-03-25 | Disposition: A | Discharge status: Home / Self Care | Source: Ambulatory Visit | Attending: Obstetrics and Gynecology | Admitting: Obstetrics and Gynecology

## 2024-03-25 ENCOUNTER — Other Ambulatory Visit: Payer: Self-pay

## 2024-03-25 ENCOUNTER — Encounter: Payer: Self-pay | Admitting: Obstetrics and Gynecology

## 2024-03-25 VITALS — BP 126/77 | HR 94 | Wt 165.9 lb

## 2024-03-25 DIAGNOSIS — Z1331 Encounter for screening for depression: Secondary | ICD-10-CM

## 2024-03-25 DIAGNOSIS — N939 Abnormal uterine and vaginal bleeding, unspecified: Secondary | ICD-10-CM | POA: Diagnosis not present

## 2024-03-25 DIAGNOSIS — Z113 Encounter for screening for infections with a predominantly sexual mode of transmission: Secondary | ICD-10-CM | POA: Diagnosis present

## 2024-03-25 NOTE — Telephone Encounter (Signed)
 Requested Prescriptions  Refused Prescriptions Disp Refills   atorvastatin  (LIPITOR) 20 MG tablet [Pharmacy Med Name: ATORVASTATIN  20MG  TABLETS] 90 tablet 1    Sig: TAKE 1 TABLET(20 MG) BY MOUTH DAILY     Cardiovascular:  Antilipid - Statins Failed - 03/25/2024  3:25 PM      Failed - Lipid Panel in normal range within the last 12 months    Cholesterol, Total  Date Value Ref Range Status  12/11/2023 210 (H) 100 - 199 mg/dL Final   LDL Chol Calc (NIH)  Date Value Ref Range Status  12/11/2023 130 (H) 0 - 99 mg/dL Final   HDL  Date Value Ref Range Status  12/11/2023 45 >39 mg/dL Final   Triglycerides  Date Value Ref Range Status  12/11/2023 197 (H) 0 - 149 mg/dL Final         Passed - Patient is not pregnant      Passed - Valid encounter within last 12 months    Recent Outpatient Visits           3 months ago Dysuria   [place] [facility] Celestia Rosaline SQUIBB, NP   9 months ago Essential hypertension   [place] [facility] Celestia Rosaline SQUIBB, NP   1 year ago Annual physical exam   [place] [facility] Celestia Rosaline SQUIBB, NP   1 year ago Cervical cancer screening   [place] [facility] Celestia Rosaline SQUIBB, NP   1 year ago Frequency of urination and polyuria   [place] [facility] Celestia Rosaline SQUIBB, NP

## 2024-03-25 NOTE — Progress Notes (Signed)
 GYNECOLOGY VISIT  Patient name: Ann Bruce MRN [id number]  Date of birth: [birth year] Chief Complaint:   Gynecologic Exam  History:  Ann Bruce  concerned about menopause - having hot flashes at night. Bleeding more than usual - menses are still montly, will have light bleeding 3-4 days prior to actual onset of menses. Duration of regualar menses varies.   Everytime she has intercourse will have bleeding - will be heavy for a while and then stop the following day. Had not had sex for about 2 years and then recently resumed. Bleeding during the menses is also heavier. When bleeding the week before eit's light and may be up to moderate. Going on for 3-4 months. No pain with intercourse and menses are associate with some mild cramping and breast. Does not feel liek it's taring, but will have sorenss in the vagina follow ing intercourse. Does not believe sh eis having tears.    The following portions of the patient's history were reviewed and updated as appropriate: allergies, current medications, past family history, past medical history, past social history, past surgical history and problem list.   Health Maintenance:   Last pap     Component Value Date/Time   DIAGPAP  12/30/2022 1107    - Negative for Intraepithelial Lesions or Malignancy (NILM)   DIAGPAP - Benign reactive/reparative changes 12/30/2022 1107   DIAGPAP  06/24/2019 1345    - Negative for intraepithelial lesion or malignancy (NILM)   HPVHIGH Negative 12/30/2022 1107   HPVHIGH Positive (A) 06/24/2019 1345   ADEQPAP  12/30/2022 1107    Satisfactory for evaluation; transformation zone component PRESENT.   ADEQPAP  06/24/2019 1345    Satisfactory for evaluation; transformation zone component PRESENT.    Health Maintenance  Topic Date Due   COVID-19 Vaccine (1) Never done   Hepatitis B Vaccine (1 of 3 - 19+ 3-dose series) Never done   HPV Vaccine (1 - 3-dose SCDM series) Never done   Flu Shot  12/26/2023    Pneumococcal Vaccine (1 of 2 - PCV) 06/18/2024*   Pap with HPV screening  12/30/2027   DTaP/Tdap/Td vaccine (2 - Td or Tdap) 10/19/2029   Hepatitis C Screening  Completed   HIV Screening  Completed   Meningitis B Vaccine  Aged Out  *Topic was postponed. The date shown is not the original due date.      Review of Systems:  Pertinent items are noted in HPI. Comprehensive review of systems was otherwise negative.   Objective:  Physical Exam BP 126/77   Pulse 94   Wt 165 lb 14.4 oz (75.3 kg)   LMP 03/21/2024   BMI 25.23 kg/m    Physical Exam Vitals and nursing note reviewed. Exam conducted with a chaperone present.  Constitutional:      Appearance: Normal appearance.  HENT:     Head: Normocephalic and atraumatic.  Pulmonary:     Effort: Pulmonary effort is normal.     Breath sounds: Normal breath sounds.  Genitourinary:    General: Normal vulva.     Exam position: Lithotomy position.     Vagina: Normal.     Cervix: Normal. No friability or erythema.  Skin:    General: Skin is warm and dry.  Neurological:     General: No focal deficit present.     Mental Status: She is alert.  Psychiatric:        Mood and Affect: Mood normal.  Behavior: Behavior normal.        Thought Content: Thought content normal.        Judgment: Judgment normal.        Assessment & Plan:   1. Abnormal uterine bleeding (AUB) (Primary) Pelvic US  ordered to assess for structural contributions to bleeding. Normal appearing cervix.  - US  PELVIC COMPLETE WITH TRANSVAGINAL; Future  2. Screening examination for STD (sexually transmitted disease) STI testing today.  - RPR+HBsAg+HCVAb+... - Cervicovaginal ancillary only    Carter Quarry, MD [facility]

## 2024-03-27 LAB — RPR+HBSAG+HCVAB+...
HIV Screen 4th Generation wRfx: NONREACTIVE
Hep C Virus Ab: NONREACTIVE
Hepatitis B Surface Ag: NEGATIVE
RPR Ser Ql: NONREACTIVE

## 2024-03-29 ENCOUNTER — Ambulatory Visit: Payer: Self-pay | Admitting: Obstetrics and Gynecology

## 2024-03-29 LAB — CERVICOVAGINAL ANCILLARY ONLY
Bacterial Vaginitis (gardnerella): POSITIVE — AB
Candida Glabrata: NEGATIVE
Candida Vaginitis: NEGATIVE
Chlamydia: NEGATIVE
Comment: NEGATIVE
Comment: NEGATIVE
Comment: NEGATIVE
Comment: NEGATIVE
Comment: NEGATIVE
Comment: NORMAL
Neisseria Gonorrhea: NEGATIVE
Trichomonas: NEGATIVE

## 2024-03-30 ENCOUNTER — Ambulatory Visit (HOSPITAL_COMMUNITY)
Admission: RE | Admit: 2024-03-30 | Discharge: 2024-03-30 | Disposition: A | Discharge status: Home / Self Care | Source: Ambulatory Visit | Attending: Obstetrics and Gynecology | Admitting: Obstetrics and Gynecology

## 2024-03-30 DIAGNOSIS — N939 Abnormal uterine and vaginal bleeding, unspecified: Secondary | ICD-10-CM | POA: Diagnosis present

## 2024-03-31 ENCOUNTER — Other Ambulatory Visit: Payer: Self-pay

## 2024-03-31 DIAGNOSIS — B9689 Other specified bacterial agents as the cause of diseases classified elsewhere: Secondary | ICD-10-CM

## 2024-03-31 MED ORDER — METRONIDAZOLE 500 MG PO TABS: 500.0000 mg | ORAL_TABLET | Freq: Two times a day (BID) | ORAL | 0 refills | Status: AC

## 2024-04-15 ENCOUNTER — Ambulatory Visit
Admission: RE | Admit: 2024-04-15 | Discharge: 2024-04-15 | Disposition: A | Discharge status: Home / Self Care | Source: Ambulatory Visit | Attending: Primary Care | Admitting: Primary Care

## 2024-04-15 DIAGNOSIS — Z809 Family history of malignant neoplasm, unspecified: Secondary | ICD-10-CM

## 2024-06-11 ENCOUNTER — Telehealth (INDEPENDENT_AMBULATORY_CARE_PROVIDER_SITE_OTHER): Payer: Self-pay | Admitting: Primary Care

## 2024-06-11 NOTE — Telephone Encounter (Signed)
 Left VM with pt about their upcoming appt. Pt did not answer

## 2024-06-14 ENCOUNTER — Other Ambulatory Visit (HOSPITAL_COMMUNITY)
Admission: RE | Admit: 2024-06-14 | Discharge: 2024-06-14 | Disposition: A | Discharge status: Home / Self Care | Source: Ambulatory Visit | Attending: Primary Care | Admitting: Primary Care

## 2024-06-14 ENCOUNTER — Encounter (INDEPENDENT_AMBULATORY_CARE_PROVIDER_SITE_OTHER): Payer: Self-pay | Admitting: Primary Care

## 2024-06-14 ENCOUNTER — Ambulatory Visit (INDEPENDENT_AMBULATORY_CARE_PROVIDER_SITE_OTHER): Admitting: Primary Care

## 2024-06-14 VITALS — BP 131/80 | HR 90 | Resp 16 | Ht 69.0 in | Wt 167.8 lb

## 2024-06-14 DIAGNOSIS — I10 Essential (primary) hypertension: Secondary | ICD-10-CM

## 2024-06-14 DIAGNOSIS — Z760 Encounter for issue of repeat prescription: Secondary | ICD-10-CM

## 2024-06-14 DIAGNOSIS — K5901 Slow transit constipation: Secondary | ICD-10-CM

## 2024-06-14 DIAGNOSIS — K644 Residual hemorrhoidal skin tags: Secondary | ICD-10-CM

## 2024-06-14 DIAGNOSIS — Z716 Tobacco abuse counseling: Secondary | ICD-10-CM | POA: Diagnosis not present

## 2024-06-14 DIAGNOSIS — G4700 Insomnia, unspecified: Secondary | ICD-10-CM | POA: Diagnosis not present

## 2024-06-14 DIAGNOSIS — E876 Hypokalemia: Secondary | ICD-10-CM | POA: Diagnosis not present

## 2024-06-14 DIAGNOSIS — N898 Other specified noninflammatory disorders of vagina: Secondary | ICD-10-CM | POA: Insufficient documentation

## 2024-06-14 DIAGNOSIS — E782 Mixed hyperlipidemia: Secondary | ICD-10-CM

## 2024-06-14 DIAGNOSIS — N920 Excessive and frequent menstruation with regular cycle: Secondary | ICD-10-CM

## 2024-06-14 DIAGNOSIS — R4689 Other symptoms and signs involving appearance and behavior: Secondary | ICD-10-CM

## 2024-06-14 DIAGNOSIS — F4323 Adjustment disorder with mixed anxiety and depressed mood: Secondary | ICD-10-CM | POA: Diagnosis not present

## 2024-06-14 MED ORDER — AMLODIPINE BESYLATE 10 MG PO TABS: ORAL_TABLET | ORAL | 1 refills | Status: AC

## 2024-06-14 MED ORDER — LINACLOTIDE 145 MCG PO CAPS: 145.0000 ug | ORAL_CAPSULE | Freq: Every day | ORAL | 1 refills | Status: AC

## 2024-06-14 MED ORDER — TRAZODONE HCL 50 MG PO TABS: 25.0000 mg | ORAL_TABLET | Freq: Every day | ORAL | 1 refills | Status: AC

## 2024-06-14 MED ORDER — LOSARTAN POTASSIUM-HCTZ 100-25 MG PO TABS: 1.0000 | ORAL_TABLET | Freq: Every day | ORAL | 1 refills | Status: AC

## 2024-06-14 MED ORDER — BUPROPION HCL ER (SR) 100 MG PO TB12: 100.0000 mg | ORAL_TABLET | Freq: Two times a day (BID) | ORAL | 1 refills | Status: AC

## 2024-06-14 MED ORDER — ATORVASTATIN CALCIUM 20 MG PO TABS: 20.0000 mg | ORAL_TABLET | Freq: Every day | ORAL | 1 refills | Status: AC

## 2024-06-14 NOTE — Addendum Note (Signed)
 Addended by: CELESTIA BROWNING on: 06/14/2024 03:50 PM   Modules accepted: Orders

## 2024-06-14 NOTE — Progress Notes (Addendum)
 " [facility]  Lanora Reveron, is a 39 y.o. female  RDW:[id number]  FMW:[id number]  DOB - [birth year]  Chief Complaint  Patient presents with   Hypertension   Medication Management    Pt states she never received    Hemorrhoids    Referral to GI   Referral    Pt is wanting to be scan for austim. Pt states her oldest has ADHD and her youngest has austim     Smoking Cessation Screening    Pt is wanting something to help stop smoking        Subjective:   Javayah Magaw is a 39 y.o. female here today for a follow up visit.  Hypertension is controlled patient has No headache, No chest pain, No abdominal pain - No Nausea, No new weakness tingling or numbness, No Cough - shortness of breath.  She complains of painful hemorrhoid examined today grade 3 external hemorrhoid will refer to general surgery. Also patient is finding it difficult to follow through on things or in the middle of doing something forgets what she is doing and questioned if she has autism or ADD.  She is has seen psychiatrist and was told she doe not have  ADHD/ADD.  Nw she is concerned since she has 2 boys that are autistic does she carry the gene.   No problems updated.  Comprehensive ROS Pertinent positive and negative noted in HPI   Allergies[1]  Past Medical History:  Diagnosis Date   Dyspnea    with pregnancy - exertion   Hypertension    Ovarian cyst    UTI (urinary tract infection)     Medications Ordered Prior to Encounter[2] Health Maintenance  Topic Date Due   Hepatitis B Vaccine (1 of 3 - 19+ 3-dose series) Never done   COVID-19 Vaccine (1 - 2025-26 season) Never done   Pneumococcal Vaccine (1 of 2 - PCV) 06/18/2024*   Flu Shot  08/24/2024*   Pap with HPV screening  12/30/2027   DTaP/Tdap/Td vaccine (2 - Td or Tdap) 10/19/2029   HPV Vaccine (No Doses Required) Completed   Hepatitis C Screening  Completed   HIV Screening  Completed   Meningitis B Vaccine  Aged Out  *Topic was  postponed. The date shown is not the original due date.    Objective:   Vitals:   06/14/24 1354  BP: 131/80  Pulse: 90  Resp: 16  SpO2: 100%  Weight: 167 lb 12.8 oz (76.1 kg)  Height: 5' 9 (1.753 m)   BP Readings from Last 3 Encounters:  06/14/24 131/80  03/25/24 126/77  12/11/23 128/83      Physical Exam Vitals reviewed.  Constitutional:      Appearance: Normal appearance. She is normal weight.  HENT:     Head: Normocephalic.     Right Ear: Tympanic membrane, ear canal and external ear normal.     Left Ear: Tympanic membrane, ear canal and external ear normal.     Nose: Nose normal.     Mouth/Throat:     Mouth: Mucous membranes are moist.  Eyes:     Extraocular Movements: Extraocular movements intact.     Pupils: Pupils are equal, round, and reactive to light.  Cardiovascular:     Rate and Rhythm: Normal rate.  Pulmonary:     Effort: Pulmonary effort is normal.     Breath sounds: Normal breath sounds.  Abdominal:     General: Bowel sounds are normal.     Palpations:  Abdomen is soft.  Musculoskeletal:        General: Normal range of motion.     Cervical back: Normal range of motion.  Skin:    General: Skin is warm and dry.     Comments: Rectum- external grade 3 hemorrhoid   Neurological:     Mental Status: She is alert and oriented to person, place, and time.  Psychiatric:        Mood and Affect: Mood normal.        Behavior: Behavior normal.        Thought Content: Thought content normal.       Assessment & Plan  Leone was seen today for hypertension, medication management, hemorrhoids, referral and smoking cessation screening.  Diagnoses and all orders for this visit:  Essential hypertension Well controlled  -     CMP14+EGFR -     losartan -hydrochlorothiazide  (HYZAAR) 100-25 MG tablet; Take 1 tablet by mouth daily. -     amLODipine  (NORVASC ) 10 MG tablet; TAKE 1 TABLET(10 MG) BY MOUTH DAILY  Hypokalemia -     CMP14+EGFR  Menorrhagia with  regular cycle -     CBC with Differential/Platelet  Vaginal discharge -     Cancel: Cervicovaginal ancillary only -     Cervicovaginal ancillary only  Mixed hyperlipidemia -     Lipid panel -     atorvastatin  (LIPITOR) 20 MG tablet; Take 1 tablet (20 mg total) by mouth daily.  Autistic behavior -     Consult to genetic counseling  Bleeding external hemorrhoids Negative FOBT   Medication refill -     traZODone  (DESYREL ) 50 MG tablet; Take 0.5-2 tablets (25-100 mg total) by mouth at bedtime. For sleep -     losartan -hydrochlorothiazide  (HYZAAR) 100-25 MG tablet; Take 1 tablet by mouth daily. -     amLODipine  (NORVASC ) 10 MG tablet; TAKE 1 TABLET(10 MG) BY MOUTH DAILY  Insomnia, unspecified type -     traZODone  (DESYREL ) 50 MG tablet; Take 0.5-2 tablets (25-100 mg total) by mouth at bedtime. For sleep  Adjustment disorder with mixed anxiety and depressed mood -     traZODone  (DESYREL ) 50 MG tablet; Take 0.5-2 tablets (25-100 mg total) by mouth at bedtime. For sleep  Slow transit constipation -     linaclotide  (LINZESS ) 145 MCG CAPS capsule; Take 1 capsule (145 mcg total) by mouth daily before breakfast.  Adjustment disorder with mixed anxiety and depressed mood -     traZODone  (DESYREL ) 50 MG tablet; Take 0.5-2 tablets (25-100 mg total) by mouth at bedtime. For sleep -     buPROPion  ER (WELLBUTRIN  SR) 100 MG 12 hr tablet; Take 1 tablet (100 mg total) by mouth 2 (two) times daily.  Slow transit constipation -     linaclotide  (LINZESS ) 145 MCG CAPS capsule; Take 1 capsule (145 mcg total) by mouth daily before breakfast.  Encounter for smoking cessation counseling -     buPROPion  ER (WELLBUTRIN  SR) 100 MG 12 hr tablet; Take 1 tablet (100 mg total) by mouth 2 (two) times daily.       Patient have been counseled extensively about nutrition and exercise. Other issues discussed during this visit include: low cholesterol diet, weight control and daily exercise, foot care, annual eye  examinations at Ophthalmology, importance of adherence with medications and regular follow-up. We also discussed long term complications of uncontrolled diabetes and hypertension.   Return in about 6 months (around 12/12/2024) for HTN, fasting labs.  The patient was  given clear instructions to go to ER or return to medical center if symptoms don't improve, worsen or new problems develop. The patient verbalized understanding. The patient was told to call to get lab results if they haven't heard anything in the next week.   This note has been created with Education officer, environmental. Any transcriptional errors are unintentional.   Rosaline SHAUNNA Bohr, NP 06/14/2024, 3:25 PM     [1] No Known Allergies [2]  Current Outpatient Medications on File Prior to Visit  Medication Sig Dispense Refill   metroNIDAZOLE  (FLAGYL ) 500 MG tablet Take 1 tablet (500 mg total) by mouth 2 (two) times daily. 14 tablet 0   No current facility-administered medications on file prior to visit.   "

## 2024-06-14 NOTE — Addendum Note (Signed)
 Addended by: CELESTIA BROWNING on: 06/14/2024 03:37 PM   Modules accepted: Orders

## 2024-06-15 LAB — CMP14+EGFR
ALT: 14 IU/L (ref 0–32)
AST: 16 IU/L (ref 0–40)
Albumin: 4.6 g/dL (ref 3.9–4.9)
Alkaline Phosphatase: 66 IU/L (ref 41–116)
BUN/Creatinine Ratio: 15 (ref 9–23)
BUN: 10 mg/dL (ref 6–20)
Bilirubin Total: 0.2 mg/dL (ref 0.0–1.2)
CO2: 27 mmol/L (ref 20–29)
Calcium: 9.3 mg/dL (ref 8.7–10.2)
Chloride: 99 mmol/L (ref 96–106)
Creatinine, Ser: 0.67 mg/dL (ref 0.57–1.00)
Globulin, Total: 2.6 g/dL (ref 1.5–4.5)
Glucose: 72 mg/dL (ref 70–99)
Potassium: 3.4 mmol/L — ABNORMAL LOW (ref 3.5–5.2)
Sodium: 142 mmol/L (ref 134–144)
Total Protein: 7.2 g/dL (ref 6.0–8.5)
eGFR: 114 mL/min/1.73

## 2024-06-15 LAB — CBC WITH DIFFERENTIAL/PLATELET
Basophils Absolute: 0 x10E3/uL (ref 0.0–0.2)
Basos: 1 %
EOS (ABSOLUTE): 0 x10E3/uL (ref 0.0–0.4)
Eos: 1 %
Hematocrit: 39.8 % (ref 34.0–46.6)
Hemoglobin: 13.5 g/dL (ref 11.1–15.9)
Immature Grans (Abs): 0 x10E3/uL (ref 0.0–0.1)
Immature Granulocytes: 0 %
Lymphocytes Absolute: 1.9 x10E3/uL (ref 0.7–3.1)
Lymphs: 55 %
MCH: 31.4 pg (ref 26.6–33.0)
MCHC: 33.9 g/dL (ref 31.5–35.7)
MCV: 93 fL (ref 79–97)
Monocytes Absolute: 0.4 x10E3/uL (ref 0.1–0.9)
Monocytes: 10 %
Neutrophils Absolute: 1.1 x10E3/uL — ABNORMAL LOW (ref 1.4–7.0)
Neutrophils: 33 %
Platelets: 228 x10E3/uL (ref 150–450)
RBC: 4.3 x10E6/uL (ref 3.77–5.28)
RDW: 11.7 % (ref 11.7–15.4)
WBC: 3.4 x10E3/uL (ref 3.4–10.8)

## 2024-06-15 LAB — LIPID PANEL
Chol/HDL Ratio: 4.3 ratio (ref 0.0–4.4)
Cholesterol, Total: 146 mg/dL (ref 100–199)
HDL: 34 mg/dL — ABNORMAL LOW
LDL Chol Calc (NIH): 90 mg/dL (ref 0–99)
Triglycerides: 123 mg/dL (ref 0–149)
VLDL Cholesterol Cal: 22 mg/dL (ref 5–40)

## 2024-06-16 LAB — CERVICOVAGINAL ANCILLARY ONLY
Bacterial Vaginitis (gardnerella): NEGATIVE
Candida Glabrata: NEGATIVE
Candida Vaginitis: NEGATIVE
Chlamydia: NEGATIVE
Comment: NEGATIVE
Comment: NEGATIVE
Comment: NEGATIVE
Comment: NEGATIVE
Comment: NEGATIVE
Comment: NORMAL
Neisseria Gonorrhea: NEGATIVE
Trichomonas: NEGATIVE

## 2024-06-18 ENCOUNTER — Ambulatory Visit (INDEPENDENT_AMBULATORY_CARE_PROVIDER_SITE_OTHER): Payer: Self-pay | Admitting: Primary Care

## 2024-09-13 ENCOUNTER — Ambulatory Visit (INDEPENDENT_AMBULATORY_CARE_PROVIDER_SITE_OTHER): Payer: Self-pay | Admitting: Primary Care
